# Patient Record
Sex: Female | Born: 1935 | Race: White | Hispanic: No | Smoking: Former smoker
Health system: Southern US, Community
[De-identification: ages and names within clinical notes are randomized; demographics above are authoritative.]

## PROBLEM LIST (undated history)

## (undated) DIAGNOSIS — K5790 Diverticulosis of intestine, part unspecified, without perforation or abscess without bleeding: Secondary | ICD-10-CM

## (undated) DIAGNOSIS — K219 Gastro-esophageal reflux disease without esophagitis: Secondary | ICD-10-CM

## (undated) DIAGNOSIS — M1990 Unspecified osteoarthritis, unspecified site: Secondary | ICD-10-CM

## (undated) DIAGNOSIS — D649 Anemia, unspecified: Secondary | ICD-10-CM

## (undated) DIAGNOSIS — G8929 Other chronic pain: Secondary | ICD-10-CM

## (undated) DIAGNOSIS — E538 Deficiency of other specified B group vitamins: Secondary | ICD-10-CM

## (undated) DIAGNOSIS — M48061 Spinal stenosis, lumbar region without neurogenic claudication: Secondary | ICD-10-CM

## (undated) DIAGNOSIS — T7840XA Allergy, unspecified, initial encounter: Secondary | ICD-10-CM

## (undated) DIAGNOSIS — IMO0002 Reserved for concepts with insufficient information to code with codable children: Secondary | ICD-10-CM

## (undated) DIAGNOSIS — I10 Essential (primary) hypertension: Secondary | ICD-10-CM

## (undated) DIAGNOSIS — N390 Urinary tract infection, site not specified: Secondary | ICD-10-CM

## (undated) DIAGNOSIS — I4891 Unspecified atrial fibrillation: Secondary | ICD-10-CM

## (undated) DIAGNOSIS — K222 Esophageal obstruction: Secondary | ICD-10-CM

## (undated) DIAGNOSIS — R1013 Epigastric pain: Secondary | ICD-10-CM

## (undated) HISTORY — DX: Deficiency of other specified B group vitamins: E53.8

## (undated) HISTORY — DX: Other chronic pain: G89.29

## (undated) HISTORY — DX: Epigastric pain: R10.13

## (undated) HISTORY — DX: Reserved for concepts with insufficient information to code with codable children: IMO0002

## (undated) HISTORY — DX: Diverticulosis of intestine, part unspecified, without perforation or abscess without bleeding: K57.90

## (undated) HISTORY — DX: Allergy, unspecified, initial encounter: T78.40XA

## (undated) HISTORY — DX: Urinary tract infection, site not specified: N39.0

## (undated) HISTORY — DX: Essential (primary) hypertension: I10

## (undated) HISTORY — PX: APPENDECTOMY: SHX54

## (undated) HISTORY — DX: Unspecified atrial fibrillation: I48.91

## (undated) HISTORY — DX: Gastro-esophageal reflux disease without esophagitis: K21.9

## (undated) HISTORY — DX: Unspecified osteoarthritis, unspecified site: M19.90

## (undated) HISTORY — DX: Esophageal obstruction: K22.2

## (undated) HISTORY — DX: Anemia, unspecified: D64.9

## (undated) HISTORY — PX: TONSILLECTOMY: SUR1361

## (undated) HISTORY — DX: Spinal stenosis, lumbar region without neurogenic claudication: M48.061

---

## 1967-08-02 HISTORY — PX: ABDOMINAL HYSTERECTOMY: SHX81

## 1997-08-01 HISTORY — PX: CHOLECYSTECTOMY: SHX55

## 2000-05-15 ENCOUNTER — Other Ambulatory Visit: Admission: RE | Admit: 2000-05-15 | Discharge: 2000-06-02 | Payer: Self-pay | Admitting: Family Medicine

## 2000-05-22 ENCOUNTER — Encounter: Payer: Self-pay | Admitting: Family Medicine

## 2000-05-22 ENCOUNTER — Encounter: Admission: RE | Admit: 2000-05-22 | Discharge: 2000-05-22 | Payer: Self-pay | Admitting: Family Medicine

## 2000-08-29 ENCOUNTER — Encounter: Payer: Self-pay | Admitting: Emergency Medicine

## 2000-08-29 ENCOUNTER — Emergency Department (HOSPITAL_COMMUNITY): Admission: EM | Admit: 2000-08-29 | Discharge: 2000-08-30 | Payer: Self-pay | Admitting: Emergency Medicine

## 2001-08-03 ENCOUNTER — Emergency Department (HOSPITAL_COMMUNITY): Admission: EM | Admit: 2001-08-03 | Discharge: 2001-08-03 | Payer: Self-pay | Admitting: Emergency Medicine

## 2001-08-03 ENCOUNTER — Encounter: Payer: Self-pay | Admitting: Emergency Medicine

## 2003-08-02 HISTORY — PX: COLONOSCOPY: SHX174

## 2003-10-08 ENCOUNTER — Emergency Department (HOSPITAL_COMMUNITY): Admission: EM | Admit: 2003-10-08 | Discharge: 2003-10-08 | Payer: Self-pay | Admitting: Emergency Medicine

## 2003-11-06 ENCOUNTER — Ambulatory Visit (HOSPITAL_COMMUNITY): Admission: RE | Admit: 2003-11-06 | Discharge: 2003-11-06 | Payer: Self-pay | Admitting: Internal Medicine

## 2004-05-16 ENCOUNTER — Emergency Department (HOSPITAL_COMMUNITY): Admission: EM | Admit: 2004-05-16 | Discharge: 2004-05-17 | Payer: Self-pay

## 2004-05-28 DIAGNOSIS — K5730 Diverticulosis of large intestine without perforation or abscess without bleeding: Secondary | ICD-10-CM | POA: Insufficient documentation

## 2004-07-06 ENCOUNTER — Ambulatory Visit: Payer: Self-pay | Admitting: Gastroenterology

## 2004-07-16 ENCOUNTER — Ambulatory Visit: Payer: Self-pay | Admitting: Gastroenterology

## 2004-08-24 ENCOUNTER — Ambulatory Visit: Payer: Self-pay | Admitting: Gastroenterology

## 2004-10-05 ENCOUNTER — Ambulatory Visit: Payer: Self-pay | Admitting: Gastroenterology

## 2004-10-12 ENCOUNTER — Ambulatory Visit: Payer: Self-pay | Admitting: Gastroenterology

## 2004-10-19 ENCOUNTER — Ambulatory Visit: Payer: Self-pay | Admitting: Gastroenterology

## 2004-12-15 ENCOUNTER — Ambulatory Visit: Payer: Self-pay | Admitting: Gastroenterology

## 2004-12-15 ENCOUNTER — Encounter (INDEPENDENT_AMBULATORY_CARE_PROVIDER_SITE_OTHER): Payer: Self-pay | Admitting: Specialist

## 2005-01-20 ENCOUNTER — Ambulatory Visit (HOSPITAL_COMMUNITY): Admission: RE | Admit: 2005-01-20 | Discharge: 2005-01-20 | Payer: Self-pay | Admitting: Gastroenterology

## 2005-01-20 ENCOUNTER — Ambulatory Visit: Payer: Self-pay | Admitting: Gastroenterology

## 2005-03-29 ENCOUNTER — Ambulatory Visit: Payer: Self-pay | Admitting: Gastroenterology

## 2006-02-14 ENCOUNTER — Ambulatory Visit: Payer: Self-pay | Admitting: Gastroenterology

## 2006-03-27 ENCOUNTER — Ambulatory Visit: Payer: Self-pay | Admitting: Gastroenterology

## 2006-04-05 ENCOUNTER — Ambulatory Visit: Payer: Self-pay | Admitting: Gastroenterology

## 2006-04-05 ENCOUNTER — Encounter (INDEPENDENT_AMBULATORY_CARE_PROVIDER_SITE_OTHER): Payer: Self-pay | Admitting: Specialist

## 2006-11-06 ENCOUNTER — Encounter (INDEPENDENT_AMBULATORY_CARE_PROVIDER_SITE_OTHER): Payer: Self-pay | Admitting: Family Medicine

## 2006-11-09 ENCOUNTER — Ambulatory Visit: Payer: Self-pay | Admitting: Gastroenterology

## 2007-02-05 ENCOUNTER — Ambulatory Visit: Payer: Self-pay | Admitting: Family Medicine

## 2007-02-05 DIAGNOSIS — K219 Gastro-esophageal reflux disease without esophagitis: Secondary | ICD-10-CM

## 2007-02-05 DIAGNOSIS — J309 Allergic rhinitis, unspecified: Secondary | ICD-10-CM | POA: Insufficient documentation

## 2007-02-05 DIAGNOSIS — N318 Other neuromuscular dysfunction of bladder: Secondary | ICD-10-CM | POA: Insufficient documentation

## 2007-02-12 ENCOUNTER — Ambulatory Visit: Payer: Self-pay | Admitting: Family Medicine

## 2007-03-01 ENCOUNTER — Ambulatory Visit: Payer: Self-pay | Admitting: Gastroenterology

## 2007-03-01 LAB — CONVERTED CEMR LAB
AST: 24 units/L (ref 0–37)
Albumin: 3.7 g/dL (ref 3.5–5.2)
Basophils Absolute: 0 10*3/uL (ref 0.0–0.1)
Basophils Relative: 1 % (ref 0.0–1.0)
Bilirubin, Direct: 0.1 mg/dL (ref 0.0–0.3)
Eosinophils Absolute: 0.1 10*3/uL (ref 0.0–0.6)
Folate: >20 ng/mL
GFR calc Af Amer: 78 mL/min
GFR calc non Af Amer: 65 mL/min
Glucose, Bld: 104 mg/dL — ABNORMAL HIGH (ref 70–99)
Hemoglobin: 9.9 g/dL — ABNORMAL LOW (ref 12.0–15.0)
Lymphocytes Relative: 37.1 % (ref 12.0–46.0)
MCHC: 34 g/dL (ref 30.0–36.0)
Monocytes Absolute: 0.4 10*3/uL (ref 0.2–0.7)
Monocytes Relative: 9.5 % (ref 3.0–11.0)
Neutro Abs: 2 10*3/uL (ref 1.4–7.7)
Platelets: 197 10*3/uL (ref 150–400)
Potassium: 4.7 meq/L (ref 3.5–5.1)
Sodium: 140 meq/L (ref 135–145)
TSH: 1.26 microintl units/mL (ref 0.35–5.50)
Vitamin B-12: 669 pg/mL (ref 211–911)

## 2007-03-05 ENCOUNTER — Ambulatory Visit: Payer: Self-pay | Admitting: Family Medicine

## 2007-03-05 LAB — CONVERTED CEMR LAB

## 2007-03-12 ENCOUNTER — Ambulatory Visit: Payer: Self-pay | Admitting: Family Medicine

## 2007-03-19 ENCOUNTER — Ambulatory Visit: Payer: Self-pay | Admitting: Family Medicine

## 2007-04-03 LAB — HM DEXA SCAN

## 2007-04-09 ENCOUNTER — Ambulatory Visit: Payer: Self-pay | Admitting: Family Medicine

## 2007-04-10 ENCOUNTER — Telehealth (INDEPENDENT_AMBULATORY_CARE_PROVIDER_SITE_OTHER): Payer: Self-pay | Admitting: *Deleted

## 2007-04-10 ENCOUNTER — Encounter (INDEPENDENT_AMBULATORY_CARE_PROVIDER_SITE_OTHER): Payer: Self-pay | Admitting: Family Medicine

## 2007-04-12 ENCOUNTER — Ambulatory Visit (HOSPITAL_COMMUNITY): Admission: RE | Admit: 2007-04-12 | Discharge: 2007-04-12 | Payer: Self-pay | Admitting: Family Medicine

## 2007-04-12 ENCOUNTER — Encounter (INDEPENDENT_AMBULATORY_CARE_PROVIDER_SITE_OTHER): Payer: Self-pay | Admitting: Family Medicine

## 2007-04-18 ENCOUNTER — Encounter: Payer: Self-pay | Admitting: Gastroenterology

## 2007-04-18 ENCOUNTER — Ambulatory Visit: Payer: Self-pay | Admitting: Gastroenterology

## 2007-04-18 DIAGNOSIS — K449 Diaphragmatic hernia without obstruction or gangrene: Secondary | ICD-10-CM | POA: Insufficient documentation

## 2007-04-18 DIAGNOSIS — K222 Esophageal obstruction: Secondary | ICD-10-CM | POA: Insufficient documentation

## 2007-04-18 LAB — CONVERTED CEMR LAB
Ferritin: 760.5 ng/mL — ABNORMAL HIGH (ref 10.0–291.0)
Folate: >20 ng/mL
Iron: 75 ug/dL (ref 42–145)
Vitamin B-12: 759 pg/mL (ref 211–911)

## 2007-04-30 ENCOUNTER — Ambulatory Visit: Payer: Self-pay | Admitting: Family Medicine

## 2007-04-30 DIAGNOSIS — M810 Age-related osteoporosis without current pathological fracture: Secondary | ICD-10-CM

## 2007-04-30 DIAGNOSIS — R319 Hematuria, unspecified: Secondary | ICD-10-CM | POA: Insufficient documentation

## 2007-04-30 LAB — CONVERTED CEMR LAB
Bilirubin Urine: NEGATIVE
Ketones, urine, test strip: NEGATIVE
Protein, U semiquant: NEGATIVE

## 2007-05-01 ENCOUNTER — Encounter (INDEPENDENT_AMBULATORY_CARE_PROVIDER_SITE_OTHER): Payer: Self-pay | Admitting: Family Medicine

## 2007-05-01 ENCOUNTER — Telehealth (INDEPENDENT_AMBULATORY_CARE_PROVIDER_SITE_OTHER): Payer: Self-pay | Admitting: *Deleted

## 2007-05-01 LAB — CONVERTED CEMR LAB
ALT: 16 units/L (ref 0–35)
AST: 20 units/L (ref 0–37)
Albumin: 3.8 g/dL (ref 3.5–5.2)
BUN: 26 mg/dL — ABNORMAL HIGH (ref 6–23)
CO2: 22 meq/L (ref 19–32)
Calcium: 8.3 mg/dL — ABNORMAL LOW (ref 8.4–10.5)
Chloride: 109 meq/L (ref 96–112)
Phosphorus: 3.4 mg/dL (ref 2.3–4.6)
Potassium: 4.2 meq/L (ref 3.5–5.3)

## 2007-05-02 ENCOUNTER — Encounter (INDEPENDENT_AMBULATORY_CARE_PROVIDER_SITE_OTHER): Payer: Self-pay | Admitting: Family Medicine

## 2007-05-02 ENCOUNTER — Telehealth (INDEPENDENT_AMBULATORY_CARE_PROVIDER_SITE_OTHER): Payer: Self-pay | Admitting: Family Medicine

## 2007-05-03 ENCOUNTER — Encounter (INDEPENDENT_AMBULATORY_CARE_PROVIDER_SITE_OTHER): Payer: Self-pay | Admitting: Family Medicine

## 2007-05-18 ENCOUNTER — Ambulatory Visit: Payer: Self-pay | Admitting: Gastroenterology

## 2007-05-22 ENCOUNTER — Ambulatory Visit: Payer: Self-pay | Admitting: Family Medicine

## 2007-06-01 ENCOUNTER — Ambulatory Visit: Payer: Self-pay | Admitting: Hematology and Oncology

## 2007-06-07 ENCOUNTER — Encounter (INDEPENDENT_AMBULATORY_CARE_PROVIDER_SITE_OTHER): Payer: Self-pay | Admitting: Family Medicine

## 2007-06-07 ENCOUNTER — Telehealth (INDEPENDENT_AMBULATORY_CARE_PROVIDER_SITE_OTHER): Payer: Self-pay | Admitting: *Deleted

## 2007-06-07 LAB — CBC & DIFF AND RETIC
BASO%: 0.7 % (ref 0.0–2.0)
EOS%: 0 % (ref 0.0–7.0)
IRF: 0.28 (ref 0.130–0.330)
MCH: 30.9 pg (ref 26.0–34.0)
MCHC: 35.5 g/dL (ref 32.0–36.0)
MONO%: 9.3 % (ref 0.0–13.0)
RDW: 13.3 % (ref 11.3–14.5)
RETIC #: 36.4 10*3/uL (ref 19.7–115.1)
lymph#: 1.2 10*3/uL (ref 0.9–3.3)

## 2007-06-07 LAB — CHCC SMEAR

## 2007-06-11 ENCOUNTER — Ambulatory Visit: Payer: Self-pay | Admitting: Family Medicine

## 2007-06-11 LAB — PROTEIN ELECTROPHORESIS, SERUM
Alpha-2-Globulin: 9.6 % (ref 7.1–11.8)
Gamma Globulin: 25.8 % — ABNORMAL HIGH (ref 11.1–18.8)
Total Protein, Serum Electrophoresis: 7.6 g/dL (ref 6.0–8.3)

## 2007-06-11 LAB — COMPREHENSIVE METABOLIC PANEL
ALT: 14 U/L (ref 0–35)
BUN: 33 mg/dL — ABNORMAL HIGH (ref 6–23)
CO2: 21 mEq/L (ref 19–32)
Creatinine, Ser: 1.01 mg/dL (ref 0.40–1.20)
Total Bilirubin: 0.7 mg/dL (ref 0.3–1.2)

## 2007-06-11 LAB — ERYTHROPOIETIN: Erythropoietin: 10.4 m[IU]/mL (ref 2.6–34.0)

## 2007-06-11 LAB — VITAMIN B12: Vitamin B-12: 492 pg/mL (ref 211–911)

## 2007-06-11 LAB — IRON AND TIBC
%SAT: 33 % (ref 20–55)
Iron: 82 ug/dL (ref 42–145)

## 2007-06-11 LAB — DIRECT ANTIGLOBULIN TEST (NOT AT ARMC)
DAT (Complement): NEGATIVE
DAT IgG: NEGATIVE

## 2007-06-11 LAB — HAPTOGLOBIN: Haptoglobin: 128 mg/dL (ref 16–200)

## 2007-07-09 ENCOUNTER — Ambulatory Visit: Payer: Self-pay | Admitting: Family Medicine

## 2007-10-05 DIAGNOSIS — D539 Nutritional anemia, unspecified: Secondary | ICD-10-CM

## 2008-04-01 ENCOUNTER — Emergency Department (HOSPITAL_COMMUNITY): Admission: EM | Admit: 2008-04-01 | Discharge: 2008-04-02 | Payer: Self-pay | Admitting: Family Medicine

## 2008-08-22 ENCOUNTER — Encounter: Payer: Self-pay | Admitting: Family Medicine

## 2008-08-22 ENCOUNTER — Encounter: Admission: RE | Admit: 2008-08-22 | Discharge: 2008-08-22 | Payer: Self-pay | Admitting: Family Medicine

## 2008-09-16 ENCOUNTER — Encounter: Payer: Self-pay | Admitting: Family Medicine

## 2008-10-21 ENCOUNTER — Ambulatory Visit: Payer: Self-pay | Admitting: Family Medicine

## 2008-10-21 DIAGNOSIS — I10 Essential (primary) hypertension: Secondary | ICD-10-CM | POA: Insufficient documentation

## 2008-10-21 LAB — CONVERTED CEMR LAB
Glucose, Urine, Semiquant: NEGATIVE
Ketones, urine, test strip: NEGATIVE
Nitrite: NEGATIVE
Protein, U semiquant: NEGATIVE

## 2008-10-22 ENCOUNTER — Encounter: Payer: Self-pay | Admitting: Family Medicine

## 2008-10-22 LAB — CONVERTED CEMR LAB
Basophils Relative: 0.5 % (ref 0.0–3.0)
Eosinophils Relative: 3.2 % (ref 0.0–5.0)
HCT: 27.6 % — ABNORMAL LOW (ref 36.0–46.0)
Hemoglobin: 9.2 g/dL — ABNORMAL LOW (ref 12.0–15.0)
Lymphs Abs: 1.4 10*3/uL (ref 0.7–4.0)
MCV: 90.1 fL (ref 78.0–100.0)
Monocytes Absolute: 0.3 10*3/uL (ref 0.1–1.0)
Neutro Abs: 2.2 10*3/uL (ref 1.4–7.7)
RBC: 3.06 M/uL — ABNORMAL LOW (ref 3.87–5.11)
WBC: 4 10*3/uL — ABNORMAL LOW (ref 4.5–10.5)

## 2008-11-17 ENCOUNTER — Ambulatory Visit: Payer: Self-pay | Admitting: Family Medicine

## 2008-12-10 ENCOUNTER — Ambulatory Visit: Payer: Self-pay | Admitting: Family Medicine

## 2008-12-15 ENCOUNTER — Telehealth: Payer: Self-pay | Admitting: Speech Pathology

## 2008-12-19 ENCOUNTER — Encounter: Payer: Self-pay | Admitting: Family Medicine

## 2008-12-23 ENCOUNTER — Encounter: Payer: Self-pay | Admitting: Family Medicine

## 2009-01-07 ENCOUNTER — Ambulatory Visit: Payer: Self-pay | Admitting: Family Medicine

## 2009-01-16 ENCOUNTER — Ambulatory Visit: Payer: Self-pay | Admitting: Family Medicine

## 2009-01-16 LAB — CONVERTED CEMR LAB
Ketones, urine, test strip: NEGATIVE
pH: 6.5

## 2009-02-04 ENCOUNTER — Ambulatory Visit: Payer: Self-pay | Admitting: Family Medicine

## 2009-02-25 ENCOUNTER — Ambulatory Visit: Payer: Self-pay | Admitting: Family Medicine

## 2009-02-25 LAB — CONVERTED CEMR LAB
Nitrite: NEGATIVE
Specific Gravity, Urine: 1.02
pH: 5.5

## 2009-02-26 ENCOUNTER — Encounter: Payer: Self-pay | Admitting: Family Medicine

## 2009-03-12 ENCOUNTER — Ambulatory Visit: Payer: Self-pay | Admitting: Family Medicine

## 2009-03-19 ENCOUNTER — Ambulatory Visit: Payer: Self-pay | Admitting: Family Medicine

## 2009-03-19 LAB — CONVERTED CEMR LAB
Glucose, Urine, Semiquant: NEGATIVE
Ketones, urine, test strip: NEGATIVE
Nitrite: NEGATIVE
Urobilinogen, UA: 0.2
pH: 6

## 2009-03-23 LAB — CONVERTED CEMR LAB
Basophils Absolute: 0 10*3/uL (ref 0.0–0.1)
CO2: 26 meq/L (ref 19–32)
Calcium: 8.8 mg/dL (ref 8.4–10.5)
Creatinine, Ser: 0.8 mg/dL (ref 0.4–1.2)
Eosinophils Absolute: 0.1 10*3/uL (ref 0.0–0.7)
GFR calc non Af Amer: 73.9 mL/min (ref >60)
Lymphs Abs: 1.5 10*3/uL (ref 0.7–4.0)
Monocytes Absolute: 0.2 10*3/uL (ref 0.1–1.0)
Neutro Abs: 2.1 10*3/uL (ref 1.4–7.7)
Platelets: 162 10*3/uL (ref 150.0–400.0)
RBC: 3.16 M/uL — ABNORMAL LOW (ref 3.87–5.11)
RDW: 12.4 % (ref 11.5–14.6)
Sodium: 141 meq/L (ref 135–145)
WBC: 3.9 10*3/uL — ABNORMAL LOW (ref 4.5–10.5)

## 2009-04-02 ENCOUNTER — Ambulatory Visit: Payer: Self-pay | Admitting: Family Medicine

## 2009-04-02 LAB — CONVERTED CEMR LAB
Bilirubin Urine: NEGATIVE
Glucose, Urine, Semiquant: NEGATIVE
Ketones, urine, test strip: NEGATIVE
Specific Gravity, Urine: 1.02
Urobilinogen, UA: 0.2

## 2009-04-03 ENCOUNTER — Encounter: Payer: Self-pay | Admitting: Family Medicine

## 2009-04-16 ENCOUNTER — Ambulatory Visit: Payer: Self-pay | Admitting: Family Medicine

## 2009-05-14 ENCOUNTER — Ambulatory Visit: Payer: Self-pay | Admitting: Family Medicine

## 2009-05-25 ENCOUNTER — Telehealth: Payer: Self-pay | Admitting: Gastroenterology

## 2009-05-25 ENCOUNTER — Ambulatory Visit: Payer: Self-pay | Admitting: Internal Medicine

## 2009-05-25 DIAGNOSIS — K210 Gastro-esophageal reflux disease with esophagitis: Secondary | ICD-10-CM

## 2009-05-25 DIAGNOSIS — R1319 Other dysphagia: Secondary | ICD-10-CM

## 2009-05-26 ENCOUNTER — Ambulatory Visit (HOSPITAL_COMMUNITY): Admission: RE | Admit: 2009-05-26 | Discharge: 2009-05-26 | Payer: Self-pay | Admitting: Internal Medicine

## 2009-05-27 LAB — CONVERTED CEMR LAB
ALT: 22 units/L (ref 0–35)
Amylase: 148 units/L — ABNORMAL HIGH (ref 27–131)
Lipase: 45 units/L (ref 11.0–59.0)
Total Bilirubin: 0.7 mg/dL (ref 0.3–1.2)

## 2009-05-28 ENCOUNTER — Ambulatory Visit: Payer: Self-pay | Admitting: Family Medicine

## 2009-05-28 LAB — CONVERTED CEMR LAB
Blood in Urine, dipstick: NEGATIVE
Protein, U semiquant: NEGATIVE
Urobilinogen, UA: 0.2
pH: 5

## 2009-05-29 ENCOUNTER — Encounter: Payer: Self-pay | Admitting: Family Medicine

## 2009-06-04 ENCOUNTER — Ambulatory Visit: Payer: Self-pay | Admitting: Internal Medicine

## 2009-06-04 HISTORY — PX: ESOPHAGOGASTRODUODENOSCOPY: SHX1529

## 2009-06-08 ENCOUNTER — Telehealth (INDEPENDENT_AMBULATORY_CARE_PROVIDER_SITE_OTHER): Payer: Self-pay

## 2009-06-10 ENCOUNTER — Ambulatory Visit: Payer: Self-pay | Admitting: Family Medicine

## 2009-06-24 ENCOUNTER — Ambulatory Visit: Payer: Self-pay | Admitting: Family Medicine

## 2009-06-24 DIAGNOSIS — N390 Urinary tract infection, site not specified: Secondary | ICD-10-CM | POA: Insufficient documentation

## 2009-06-24 LAB — CONVERTED CEMR LAB
Specific Gravity, Urine: 1.015
Urobilinogen, UA: 0.2

## 2009-06-25 ENCOUNTER — Encounter: Payer: Self-pay | Admitting: Family Medicine

## 2009-07-07 ENCOUNTER — Encounter (INDEPENDENT_AMBULATORY_CARE_PROVIDER_SITE_OTHER): Payer: Self-pay | Admitting: *Deleted

## 2009-07-09 ENCOUNTER — Ambulatory Visit: Payer: Self-pay | Admitting: Family Medicine

## 2009-07-15 ENCOUNTER — Encounter (INDEPENDENT_AMBULATORY_CARE_PROVIDER_SITE_OTHER): Payer: Self-pay | Admitting: *Deleted

## 2009-07-22 ENCOUNTER — Encounter: Payer: Self-pay | Admitting: Family Medicine

## 2009-08-01 DIAGNOSIS — I4891 Unspecified atrial fibrillation: Secondary | ICD-10-CM

## 2009-08-01 HISTORY — DX: Unspecified atrial fibrillation: I48.91

## 2009-08-04 ENCOUNTER — Ambulatory Visit: Payer: Self-pay | Admitting: Family Medicine

## 2009-08-25 ENCOUNTER — Ambulatory Visit: Payer: Self-pay | Admitting: Family Medicine

## 2009-08-31 ENCOUNTER — Encounter: Payer: Self-pay | Admitting: Family Medicine

## 2009-09-03 ENCOUNTER — Ambulatory Visit: Payer: Self-pay | Admitting: Family Medicine

## 2009-09-11 ENCOUNTER — Ambulatory Visit: Payer: Self-pay | Admitting: Family Medicine

## 2009-09-14 ENCOUNTER — Telehealth: Payer: Self-pay | Admitting: Family Medicine

## 2009-10-01 ENCOUNTER — Ambulatory Visit: Payer: Self-pay | Admitting: Family Medicine

## 2009-10-01 ENCOUNTER — Telehealth: Payer: Self-pay | Admitting: Family Medicine

## 2009-10-08 ENCOUNTER — Ambulatory Visit: Payer: Self-pay | Admitting: Family Medicine

## 2009-10-08 LAB — CONVERTED CEMR LAB
Blood in Urine, dipstick: NEGATIVE
Ketones, urine, test strip: NEGATIVE
Specific Gravity, Urine: 1.02
pH: 5

## 2009-10-09 ENCOUNTER — Encounter: Payer: Self-pay | Admitting: Family Medicine

## 2009-10-12 ENCOUNTER — Encounter: Payer: Self-pay | Admitting: Family Medicine

## 2009-11-05 ENCOUNTER — Ambulatory Visit: Payer: Self-pay | Admitting: Family Medicine

## 2009-11-12 ENCOUNTER — Ambulatory Visit: Payer: Self-pay | Admitting: Family Medicine

## 2009-11-12 LAB — CONVERTED CEMR LAB
Bilirubin Urine: NEGATIVE
Ketones, urine, test strip: NEGATIVE
Specific Gravity, Urine: 1.01
pH: 6

## 2009-11-13 ENCOUNTER — Encounter: Payer: Self-pay | Admitting: Family Medicine

## 2009-12-03 ENCOUNTER — Ambulatory Visit: Payer: Self-pay | Admitting: Family Medicine

## 2009-12-04 LAB — CONVERTED CEMR LAB
CO2: 28 meq/L (ref 19–32)
Chloride: 107 meq/L (ref 96–112)
Eosinophils Relative: 1.9 % (ref 0.0–5.0)
HCT: 29.2 % — ABNORMAL LOW (ref 36.0–46.0)
HDL: 47.9 mg/dL (ref >39.00)
LDL Cholesterol: 107 mg/dL — ABNORMAL HIGH (ref 0–99)
Lymphocytes Relative: 39.5 % (ref 12.0–46.0)
Monocytes Relative: 8.5 % (ref 3.0–12.0)
Neutrophils Relative %: 49.6 % (ref 43.0–77.0)
Platelets: 182 10*3/uL (ref 150.0–400.0)
Potassium: 4.3 meq/L (ref 3.5–5.1)
Sodium: 141 meq/L (ref 135–145)
Total CHOL/HDL Ratio: 4
Triglycerides: 113 mg/dL (ref 0.0–149.0)
Vitamin B-12: >1500 pg/mL — ABNORMAL HIGH (ref 211–911)
WBC: 3.5 10*3/uL — ABNORMAL LOW (ref 4.5–10.5)

## 2010-01-07 ENCOUNTER — Ambulatory Visit: Payer: Self-pay | Admitting: Family Medicine

## 2010-02-04 ENCOUNTER — Ambulatory Visit: Payer: Self-pay | Admitting: Family Medicine

## 2010-02-04 LAB — CONVERTED CEMR LAB
Nitrite: NEGATIVE
Protein, U semiquant: NEGATIVE
Urobilinogen, UA: 0.2

## 2010-02-05 ENCOUNTER — Encounter: Payer: Self-pay | Admitting: Family Medicine

## 2010-02-10 ENCOUNTER — Ambulatory Visit: Payer: Self-pay | Admitting: Family Medicine

## 2010-03-04 ENCOUNTER — Ambulatory Visit: Payer: Self-pay | Admitting: Family Medicine

## 2010-03-05 ENCOUNTER — Emergency Department (HOSPITAL_COMMUNITY): Admission: EM | Admit: 2010-03-05 | Discharge: 2010-03-05 | Payer: Self-pay | Admitting: Emergency Medicine

## 2010-03-10 ENCOUNTER — Ambulatory Visit: Payer: Self-pay | Admitting: Family Medicine

## 2010-03-10 DIAGNOSIS — R609 Edema, unspecified: Secondary | ICD-10-CM | POA: Insufficient documentation

## 2010-03-10 LAB — CONVERTED CEMR LAB
Bilirubin Urine: NEGATIVE
Blood in Urine, dipstick: NEGATIVE
Protein, U semiquant: NEGATIVE
Urobilinogen, UA: 0.2
WBC Urine, dipstick: NEGATIVE

## 2010-03-11 LAB — CONVERTED CEMR LAB
CO2: 26 meq/L (ref 19–32)
Calcium: 8.9 mg/dL (ref 8.4–10.5)
Chloride: 107 meq/L (ref 96–112)
Glucose, Bld: 82 mg/dL (ref 70–99)
Pro B Natriuretic peptide (BNP): 86.4 pg/mL (ref 0.0–100.0)
Sodium: 140 meq/L (ref 135–145)

## 2010-03-13 ENCOUNTER — Emergency Department (HOSPITAL_COMMUNITY): Admission: EM | Admit: 2010-03-13 | Discharge: 2010-03-13 | Payer: Self-pay | Admitting: Emergency Medicine

## 2010-03-16 ENCOUNTER — Emergency Department (HOSPITAL_COMMUNITY): Admission: EM | Admit: 2010-03-16 | Discharge: 2010-03-16 | Payer: Self-pay | Admitting: Emergency Medicine

## 2010-03-17 ENCOUNTER — Observation Stay (HOSPITAL_COMMUNITY): Admission: EM | Admit: 2010-03-17 | Discharge: 2010-03-20 | Payer: Self-pay | Admitting: Emergency Medicine

## 2010-03-18 ENCOUNTER — Ambulatory Visit: Payer: Self-pay | Admitting: Internal Medicine

## 2010-03-19 ENCOUNTER — Encounter (INDEPENDENT_AMBULATORY_CARE_PROVIDER_SITE_OTHER): Payer: Self-pay | Admitting: Family Medicine

## 2010-03-19 HISTORY — PX: ESOPHAGOGASTRODUODENOSCOPY: SHX1529

## 2010-03-22 ENCOUNTER — Encounter (INDEPENDENT_AMBULATORY_CARE_PROVIDER_SITE_OTHER): Payer: Self-pay

## 2010-03-23 ENCOUNTER — Encounter (INDEPENDENT_AMBULATORY_CARE_PROVIDER_SITE_OTHER): Payer: Self-pay | Admitting: *Deleted

## 2010-03-23 ENCOUNTER — Encounter: Payer: Self-pay | Admitting: Internal Medicine

## 2010-03-24 ENCOUNTER — Telehealth: Payer: Self-pay | Admitting: Family Medicine

## 2010-03-24 ENCOUNTER — Emergency Department (HOSPITAL_COMMUNITY): Admission: EM | Admit: 2010-03-24 | Discharge: 2010-03-24 | Payer: Self-pay | Admitting: Emergency Medicine

## 2010-03-25 ENCOUNTER — Telehealth (INDEPENDENT_AMBULATORY_CARE_PROVIDER_SITE_OTHER): Payer: Self-pay | Admitting: *Deleted

## 2010-03-26 ENCOUNTER — Encounter: Payer: Self-pay | Admitting: Internal Medicine

## 2010-03-31 ENCOUNTER — Emergency Department (HOSPITAL_COMMUNITY): Admission: EM | Admit: 2010-03-31 | Discharge: 2010-03-31 | Payer: Self-pay | Admitting: Emergency Medicine

## 2010-04-01 ENCOUNTER — Ambulatory Visit: Payer: Self-pay | Admitting: Family Medicine

## 2010-04-01 DIAGNOSIS — E538 Deficiency of other specified B group vitamins: Secondary | ICD-10-CM

## 2010-04-06 LAB — CONVERTED CEMR LAB: Tissue Transglutaminase Ab, IgA: 4.8 units (ref <20)

## 2010-04-07 ENCOUNTER — Ambulatory Visit: Payer: Self-pay | Admitting: Internal Medicine

## 2010-04-07 DIAGNOSIS — R1013 Epigastric pain: Secondary | ICD-10-CM | POA: Insufficient documentation

## 2010-04-12 ENCOUNTER — Ambulatory Visit: Payer: Self-pay | Admitting: Internal Medicine

## 2010-04-13 ENCOUNTER — Ambulatory Visit (HOSPITAL_COMMUNITY): Admission: RE | Admit: 2010-04-13 | Discharge: 2010-04-13 | Payer: Self-pay | Admitting: Internal Medicine

## 2010-04-14 ENCOUNTER — Ambulatory Visit: Payer: Self-pay | Admitting: Family Medicine

## 2010-04-14 DIAGNOSIS — I7300 Raynaud's syndrome without gangrene: Secondary | ICD-10-CM

## 2010-04-15 LAB — CONVERTED CEMR LAB
Basophils Absolute: 0 10*3/uL (ref 0.0–0.1)
Basophils Relative: 0.5 % (ref 0.0–3.0)
Eosinophils Relative: 0 % (ref 0.0–5.0)
HCT: 27.9 % — ABNORMAL LOW (ref 36.0–46.0)
Hemoglobin: 9.4 g/dL — ABNORMAL LOW (ref 12.0–15.0)
Lymphocytes Relative: 25.4 % (ref 12.0–46.0)
Lymphs Abs: 1.2 10*3/uL (ref 0.7–4.0)
Monocytes Relative: 10.5 % (ref 3.0–12.0)
Neutro Abs: 2.9 10*3/uL (ref 1.4–7.7)
RBC: 3.08 M/uL — ABNORMAL LOW (ref 3.87–5.11)
RDW: 13.5 % (ref 11.5–14.6)

## 2010-05-12 ENCOUNTER — Ambulatory Visit: Payer: Self-pay | Admitting: Family Medicine

## 2010-05-12 DIAGNOSIS — K5900 Constipation, unspecified: Secondary | ICD-10-CM | POA: Insufficient documentation

## 2010-05-20 ENCOUNTER — Encounter: Payer: Self-pay | Admitting: Internal Medicine

## 2010-05-23 ENCOUNTER — Emergency Department (HOSPITAL_COMMUNITY): Admission: EM | Admit: 2010-05-23 | Discharge: 2010-05-23 | Payer: Self-pay | Admitting: Emergency Medicine

## 2010-05-26 ENCOUNTER — Ambulatory Visit: Payer: Self-pay | Admitting: Family Medicine

## 2010-05-26 ENCOUNTER — Ambulatory Visit: Payer: Self-pay | Admitting: Cardiology

## 2010-05-26 ENCOUNTER — Telehealth: Payer: Self-pay | Admitting: Family Medicine

## 2010-05-26 DIAGNOSIS — R509 Fever, unspecified: Secondary | ICD-10-CM

## 2010-05-27 ENCOUNTER — Ambulatory Visit: Payer: Self-pay | Admitting: Cardiology

## 2010-05-27 ENCOUNTER — Inpatient Hospital Stay (HOSPITAL_COMMUNITY): Admission: EM | Admit: 2010-05-27 | Discharge: 2010-06-01 | Payer: Self-pay | Admitting: Emergency Medicine

## 2010-05-28 ENCOUNTER — Encounter (INDEPENDENT_AMBULATORY_CARE_PROVIDER_SITE_OTHER): Payer: Self-pay | Admitting: Internal Medicine

## 2010-06-01 ENCOUNTER — Encounter (INDEPENDENT_AMBULATORY_CARE_PROVIDER_SITE_OTHER): Payer: Self-pay | Admitting: *Deleted

## 2010-06-01 LAB — CONVERTED CEMR LAB
Basophils Relative: 1 %
Eosinophils Absolute: 0.4 10*3/uL
Eosinophils Relative: 8 %
HCT: 26.6 %
Hemoglobin: 8.9 g/dL
Lymphs Abs: 1.2 10*3/uL
MCHC: 29.8 g/dL
MCV: 88.7 fL
Platelets: 234 10*3/uL
RDW: 14 %

## 2010-06-02 ENCOUNTER — Encounter (INDEPENDENT_AMBULATORY_CARE_PROVIDER_SITE_OTHER): Payer: Self-pay | Admitting: *Deleted

## 2010-06-11 ENCOUNTER — Ambulatory Visit: Payer: Self-pay | Admitting: Family Medicine

## 2010-06-11 DIAGNOSIS — J159 Unspecified bacterial pneumonia: Secondary | ICD-10-CM | POA: Insufficient documentation

## 2010-06-11 DIAGNOSIS — I4891 Unspecified atrial fibrillation: Secondary | ICD-10-CM

## 2010-06-11 DIAGNOSIS — S2239XA Fracture of one rib, unspecified side, initial encounter for closed fracture: Secondary | ICD-10-CM | POA: Insufficient documentation

## 2010-06-11 LAB — CONVERTED CEMR LAB
Basophils Relative: 1 % (ref 0.0–3.0)
Eosinophils Absolute: 0.5 10*3/uL (ref 0.0–0.7)
Eosinophils Relative: 9.5 % — ABNORMAL HIGH (ref 0.0–5.0)
Lymphocytes Relative: 21.2 % (ref 12.0–46.0)
MCV: 88.6 fL (ref 78.0–100.0)
Monocytes Absolute: 0.6 10*3/uL (ref 0.1–1.0)
Neutrophils Relative %: 57.1 % (ref 43.0–77.0)
Platelets: 260 10*3/uL (ref 150.0–400.0)
RBC: 3.18 M/uL — ABNORMAL LOW (ref 3.87–5.11)
WBC: 5.1 10*3/uL (ref 4.5–10.5)

## 2010-06-13 ENCOUNTER — Emergency Department (HOSPITAL_COMMUNITY): Admission: EM | Admit: 2010-06-13 | Discharge: 2010-06-13 | Payer: Self-pay | Admitting: Emergency Medicine

## 2010-06-14 ENCOUNTER — Telehealth: Payer: Self-pay | Admitting: Family Medicine

## 2010-06-15 ENCOUNTER — Encounter: Payer: Self-pay | Admitting: Family Medicine

## 2010-07-06 ENCOUNTER — Telehealth: Payer: Self-pay | Admitting: Family Medicine

## 2010-07-22 ENCOUNTER — Encounter: Payer: Self-pay | Admitting: Gastroenterology

## 2010-08-11 ENCOUNTER — Ambulatory Visit
Admission: RE | Admit: 2010-08-11 | Discharge: 2010-08-11 | Payer: Self-pay | Source: Home / Self Care | Attending: Family Medicine | Admitting: Family Medicine

## 2010-08-11 DIAGNOSIS — B372 Candidiasis of skin and nail: Secondary | ICD-10-CM | POA: Insufficient documentation

## 2010-08-25 ENCOUNTER — Ambulatory Visit
Admission: RE | Admit: 2010-08-25 | Discharge: 2010-08-25 | Payer: Self-pay | Source: Home / Self Care | Attending: Family Medicine | Admitting: Family Medicine

## 2010-08-31 NOTE — Assessment & Plan Note (Signed)
Summary: b12 inj/njr  Nurse Visit   Allergies: 1)  ! Demerol 2)  ! Motrin 3)  ! Darvocet 4)  ! Morphine 5)  ! Darvon 6)  ! Caffeine 7)  ! Bactrim 8)  ! Penicillin V Potassium (Penicillin V Potassium)  Medication Administration  Injection # 1:    Medication: Vit B12 1000 mcg    Diagnosis: FATIGUE (ICD-780.79)    Route: IM    Site: R deltoid    Exp Date: 04/02/2011    Lot #: [id number]    Mfr: American Regent    Patient tolerated injection without complications    Given by: Sid Falcon LPN (November 06, 2839 12:20 PM)  Orders Added: 1)  Vit B12 1000 mcg [J3420] 2)  Admin of Therapeutic Inj  intramuscular or subcutaneous [32440]

## 2010-08-31 NOTE — Letter (Signed)
Summary: Patient Notice, Endo Biopsy Results  [facility]  [address]   [place], [place] [address]   Phone: [phone]  Fax: [phone]       March 26, 2010   LUCILLIA CORSON [address] [place], [place]  [address] February 28, 1930    Dear Ms. Trebilcock,  I am pleased to inform you that the biopsies taken during your recent endoscopic examination did not show any evidence of cancer upon pathologic examination.  Additional information/recommendations:  Continue with the treatment plan as outlined on the day of your exam.  Please call us if you are having persistent problems or have questions about your condition that have not been fully answered at this time.  Sincerely,    R. Roetta Sessions MD, FACP [facility] Ph: [phone]   Fax: [phone]   Appended Document: Patient Notice, Endo Biopsy Results Letter mailed to pt and letter and path mailed to Dr. Caryl Never.

## 2010-08-31 NOTE — Assessment & Plan Note (Signed)
Summary: S/P EGD 2 WKS F/U/LAW   Visit Type:  Follow-up Visit Primary Care Provider:  Evelena Peat  Chief Complaint:  F/U EGD.  History of Present Illness: Patient is here for f/u of recent EGD during hospitalization for atypical chest pain. Patient tells me she never had chest pain, pain is always in epigastrium. She r/o for MI. CTA chest was negative for PE. She had tiny nonspecific upper lobe lung nodules, compression of T5 (age indeterminate).  She had six ED visits in 8/11. Two since her D/C on 03/20/10. She states she has epigastric pain which resolves after receiving GI cocktail. She has had numerous normal LFTs. Lipase normal. WBC normal. Hgb runs in 9 range. Cardiac markers negative multiple times.   EGD 03/19/10, Dr. Rourk-->. Noncritical-appearing Schatzki's ring otherwise unremarkable esophagus status post passage of a 56-French Maloney dilator. Small hiatal hernia. Antral erythema with focal area of adenomatous-appearing mucosa of uncertain significance status post biopsy (bx benign).   Prior w/u in [place] includes several EGD/ED by Dr. Sheryn Bison. EM in 2006, low LES pressure but no evidence of achalasia. Ambulatory pH probe study showed high DeMeester score but unclear if was done on acid suppression.    Doing better on Nexium two times a day. Still with some burning under left breast. Comes and go. Swallowing better now. BM some everday. No melena, brbpr. No heartburn/regurgitation. No n/v. Appetite okay but eating only bland foods. Of note, patient was switched to Aciphex at time of EGD. She took for one week before calling and requesting to take protonix. After one week of protonix, she switched herself back to Nexium two times a day. She failed Prevacid and Prilosec in the past.   Current Medications (verified): 1)  Cyanocobalamin 1000 Mcg/ml Soln (Cyanocobalamin) .... Inject One Ml Im Monthly 2)  Monoject Syringe 25g X 1" 3 Ml Misc (Syringe/needle (Disp)) .... Use Monthly  As Directed 3)  Sertraline Hcl 50 Mg Tabs (Sertraline Hcl) .... One Half Tablet Daily 4)  Nexium 40 Mg Cpdr (Esomeprazole Magnesium) .... Take 1 Tablet By Mouth Two Times A Day 5)  Mylanta .... 2 Tablespoons Every 4 Hours 6)  Pepcid .... One Tablet Twice Daily With The Nexium  Allergies (verified): 1)  ! Demerol 2)  ! Motrin 3)  ! Darvocet 4)  ! Morphine 5)  ! Darvon 6)  ! Caffeine 7)  ! Bactrim 8)  ! Penicillin V Potassium (Penicillin V Potassium)  Past History:  Past Medical History: GERD / Esophageal strictures Allergic rhinitis Arthritis Diverticulosis Hypertension UTI, recurrent osteoporosis Lumbar stenosis Chronic normocytic anemia EGD/ED, 03/19/10--> Noncritical-appearing Schatzki's ring otherwise unremarkable esophagus status post passage of a 56-French Maloney dilator. Small hiatal hernia. Antral erythema with focal areaof adenomatous-appearing mucosa of uncertain significance status post biopsy. Path negative. B12 def     Family History: Father: deceased 81 Leukemia Mother: deceased 68 Staph Infection after hip replacement; HTN 4 brothers: DM, COPD, reflux, Multiple Myeloma 1 child: healthy Family History of Prostate Cancer:Brothers x2 No FH of CRC.  Review of Systems      See HPI  Vital Signs:  Patient profile:   77 year old female Menstrual status:  postmenopausal Height:      62 inches Weight:      137 pounds BMI:     25.15 Temp:     98.1 degrees F oral Pulse rate:   88 / minute BP sitting:   110 / 70  (left arm) Cuff size:   regular  Vitals Entered By: Cloria Spring LPN (April 07, 2010 1:34 PM)  Physical Exam  General:  Well developed, well nourished, no acute distress. Head:  Normocephalic and atraumatic. Eyes:  sclera nonicteric Mouth:  op moist Lungs:  Clear throughout to auscultation. Heart:  Regular rate and rhythm; no murmurs, rubs,  or bruits. Abdomen:  Mild epig tenderness. No rebound or guarding. No HSM or masses. No CVA  tendernss. No evidence of rash. No abd bruit or hernia.   No pinpoint tenderness over thoracic spine. Extremities:  No clubbing, cyanosis, edema or deformities noted. Neurologic:  Alert and  oriented x4;  grossly normal neurologically. Skin:  Intact without significant lesions or rashes. Psych:  Alert and cooperative. Normal mood and affect.  Impression & Recommendations:  Problem # 1:  EPIGASTRIC PAIN, CHRONIC (ICD-789.06)  75% better per patient but last ED visit only one week ago. Gastritis on EGD. She switches PPIs frequently without giving new ones chance to work. At any rate, feels better on Nexium two times a day. She also takes Pepcid for breakthrough symptoms. Still with some mild burning in LUQ. Not reproduced on exam. ?referred pain from back. Discussed with patient, she will call in two weeks or sooner if needed. If pain persisting in abd, then CT A/P with IV/oral contrast.   Orders: Est. Patient Level II (21308)  Problem # 2:  ANEMIA, CHRONIC (ICD-281.9) Stable. H/O B12 inj monthly. Previously Folate and iron levels okay in 2008. Hgb stable since 2008. Last TCS per EMR, approximately 2005/2006 and poor prep ([facility]). Discussed with patient, she needs to decide if she prefers GI care here or follow-up with Dr. Jarold Motto, at this point she is not sure. Will determine timing of next TCS in near future. Retrieve official records.   Problem # 3:  DYSPHAGIA (MVH-846.96) Resolved s/p EGD/ED. Orders: Est. Patient Level II (29528)  Patient Instructions: 1)  Please call our office in two weeks and let us know how you are feeling, ie if abdominal pain resolved. If persistent, then would consider CT abd/pelvis.  2)  Avoid foods high in acid content ( tomatoes, citrus juices, spicy foods) . Avoid eating within 3 to 4 hours of lying down or before exercising. Do not over eat; try smaller more frequent meals.   3)  The medication list was reviewed and reconciled.  All changed / newly  prescribed medications were explained.  A complete medication list was provided to the patient / caregiver.  Appended Document: S/P EGD 2 WKS F/U/LAW Never received TCS report from [facility] done around 2005/2006. Please request again.   Appended Document: S/P EGD 2 WKS F/U/LAW REQUESTED AGAIN

## 2010-08-31 NOTE — Progress Notes (Signed)
Summary: Polyethylene Glycol Rx request  Phone Note Call from Patient Call back at Home Phone [phone]   Caller: Patient Call For: Evelena Peat MD Summary of Call: Pt here for B-12 monthly inj.  Pt requesting Rx for Polyethylene Glycol powder.  Pt has been struggling with constipation and has been using OTC laxitive weekly. [facility] Initial call taken by: Sid Falcon LPN,  October 01, 2009 11:41 AM  Follow-up for Phone Call        OK to refill. Follow-up by: Evelena Peat MD,  October 01, 2009 12:39 PM  Additional Follow-up for Phone Call Additional follow up Details #1::        Pt informed Rx filled. Additional Follow-up by: Sid Falcon LPN,  October 01, 2009 1:14 PM    New/Updated Medications: POLYETHYLENE GLYCOL 3350  POWD (POLYETHYLENE GLYCOL 3350) one capful daily as directed Prescriptions: POLYETHYLENE GLYCOL 3350  POWD (POLYETHYLENE GLYCOL 3350) one capful daily as directed  #255 x 3   Entered by:   Sid Falcon LPN   Authorized by:   Evelena Peat MD   Signed by:   Sid Falcon LPN on 62/13/0865   Method used:   Electronically to        [facility] (retail)       [address]       [place], [place]  [address]       Ph: [phone]       Fax: [phone]   RxID:   [id number]

## 2010-08-31 NOTE — Letter (Signed)
Summary: CT SCAN ORDER  CT SCAN ORDER   Imported By: Ave Filter 04/12/2010 15:16:56  _____________________________________________________________________  External Attachment:    Type:   Image     Comment:   External Document  Appended Document: CT SCAN ORDER [phone]

## 2010-08-31 NOTE — Assessment & Plan Note (Signed)
Summary: b12 inj./njr/PT RSC/CJR  Nurse Visit   Allergies: 1)  ! Demerol 2)  ! Motrin 3)  ! Darvocet 4)  ! Morphine 5)  ! Darvon 6)  ! Caffeine 7)  ! Bactrim 8)  ! Penicillin V Potassium (Penicillin V Potassium)  Medication Administration  Injection # 1:    Medication: Vit B12 1000 mcg    Diagnosis: ANEMIA, CHRONIC (ICD-281.9)    Route: IM    Site: R deltoid    Exp Date: 10/31/2011    Lot #: [id number]    Mfr: app pharm    Patient tolerated injection without complications    Given by: Kathrynn Speed CMA (January 07, 2010 11:43 AM)  Orders Added: 1)  Vit B12 1000 mcg [J3420]

## 2010-08-31 NOTE — Progress Notes (Signed)
Summary: Nexium rx at new  pharmacy X 1 year  Phone Note Call from Patient Call back at Home Phone [phone]   Caller: Patient---live call Reason for Call: Refill Medication Summary of Call: Please send a new rx for Nexium 40 mg bid  to  her new pharmacy-----[facility]  in [address]. ph---[phone]. Please update pharmacy info in chart. Initial call taken by: Warnell Forester,  September 14, 2009 9:33 AM  Follow-up for Phone Call        Rx sent, pt informed Follow-up by: Sid Falcon LPN,  September 14, 2009 10:51 AM    Prescriptions: NEXIUM 40 MG CPDR (ESOMEPRAZOLE MAGNESIUM) one tab two times a day  #60 x 11   Entered by:   Sid Falcon LPN   Authorized by:   Evelena Peat MD   Signed by:   Sid Falcon LPN on 14/78/2956   Method used:   Electronically to        [facility] (retail)       [address]       [place], [place]  [address]       Ph: [phone]       Fax: [phone]   RxID:   [id number]

## 2010-08-31 NOTE — Letter (Signed)
Summary: OP REPORT FROM [facility]  OP REPORT FROM [facility]   Imported By: Rexene Alberts 05/20/2010 15:25:26  _____________________________________________________________________  External Attachment:    Type:   Image     Comment:   External Document

## 2010-08-31 NOTE — Assessment & Plan Note (Signed)
Summary: b12 with nancy//ccm  Nurse Visit   Allergies: 1)  ! Demerol 2)  ! Motrin 3)  ! Darvocet 4)  ! Morphine 5)  ! Darvon 6)  ! Caffeine 7)  ! Bactrim 8)  ! Penicillin V Potassium (Penicillin V Potassium)  Medication Administration  Injection # 1:    Medication: Vit B12 1000 mcg    Diagnosis: ANEMIA, NORMOCYTIC, CHRONIC (ICD-285.9)    Route: IM    Site: L deltoid    Exp Date: 04/02/2011    Lot #: [id number]    Mfr: American Regent    Patient tolerated injection without complications    Given by: Sid Falcon LPN (October 02, 9602 11:37 AM)  Orders Added: 1)  Vit B12 1000 mcg [J3420] 2)  Admin of Therapeutic Inj  intramuscular or subcutaneous [54098]

## 2010-08-31 NOTE — Assessment & Plan Note (Signed)
Summary: 3 month rov/b12 inj/njr   Vital Signs:  Patient profile:   77 year old female Menstrual status:  postmenopausal Weight:      147 pounds Temp:     97.7 degrees F oral BP sitting:   122 / 80  (left arm) Cuff size:   regular  Vitals Entered By: Sid Falcon LPN (March 04, 2010 1:30 PM)  History of Present Illness: Patient here for the following issues  History of B12 deficiency and needs injection. Recent levels were normal.  Long history of reflux. Has recently been eating several tomatoes up to 3 times per day and has had flareup of reflux symptoms. Symptoms seem to be worse at night. Burning sensation midepigastric region. No hematemesis or any stool changes. Taking Nexium 40 mg b.i.d.  Back pain from last visit has improved with chiropractic care. X-rays did not reveal any acute changes. She has moderate degenerative changes with spondylolisthesis L5-S1 level.  Hypertension is stable on amlodipine. She has some mild lower extremity edema which is overall stable. No orthopnea or dyspnea. Denies chest pain  Hypertension History:      She complains of peripheral edema, but denies headache, chest pain, palpitations, dyspnea with exertion, orthopnea, PND, visual symptoms, neurologic problems, syncope, and side effects from treatment.        Positive major cardiovascular risk factors include female age 67 years old or older and hypertension.  Negative major cardiovascular risk factors include non-tobacco-user status.     Allergies: 1)  ! Demerol 2)  ! Motrin 3)  ! Darvocet 4)  ! Morphine 5)  ! Darvon 6)  ! Caffeine 7)  ! Bactrim 8)  ! Penicillin V Potassium (Penicillin V Potassium)  Past History:  Past Medical History: Last updated: 11/12/2009 GERD / Esophageal strictures Allergic rhinitis Arthritis Diverticulosis Hypertension UTI, recurrent osteoporosis Lumbar stenosis Chronic normocytic anemia  Past Surgical History: Last updated:  10/21/2008 Appendectomy 1960 Cholecystectomy 1999 Hysterectomy 1969 Tonsillectomy 1954  Family History: Last updated: 2009/06/14 Father: deceased 88 Leukemia Mother: deceased 17 Staph Infection after hip replacement; HTN 4 brothers: DM, COPD, reflux, Multiple Myeloma 1 child: healthy Family History of Prostate Cancer:Brothers x2  Social History: Last updated: 02/05/2007 Divorced Former Smoker Alcohol use-no Drug use-no Retired: Corporate treasurer PMH-FH-SH reviewed for relevance  Review of Systems       The patient complains of severe indigestion/heartburn.  The patient denies anorexia, fever, weight loss, hoarseness, chest pain, syncope, dyspnea on exertion, prolonged cough, headaches, hemoptysis, abdominal pain, melena, and hematochezia.    Physical Exam  General:  Well-developed,well-nourished,in no acute distress; alert,appropriate and cooperative throughout examination Eyes:  pupils equal, pupils round, and pupils reactive to light.   Ears:  External ear exam shows no significant lesions or deformities.  Otoscopic examination reveals clear canals, tympanic membranes are intact bilaterally without bulging, retraction, inflammation or discharge. Hearing is grossly normal bilaterally. Mouth:  Oral mucosa and oropharynx without lesions or exudates.  Teeth in good repair. Neck:  No deformities, masses, or tenderness noted. Lungs:  Normal respiratory effort, chest expands symmetrically. Lungs are clear to auscultation, no crackles or wheezes. Heart:  normal rate and regular rhythm.   Abdomen:  soft with mild midepigastric tenderness. Msk:  patient has kyphotic posture. No specific point tenderness. Extremities:  trace nonpitting edema lower legs bilaterally. Feet are warm to touch. 2 plus dorsalis pedis pulses bilaterally Skin:  no rashes and no suspicious lesions.   Psych:  normally interactive, good eye contact, not anxious appearing,  and not depressed appearing.      Impression & Recommendations:  Problem # 1:  REFLUX ESOPHAGITIS (ICD-530.11) Assessment Deteriorated add short term use of Pepcid or Zantac.  Leave off offending foods.  Problem # 2:  HYPERTENSION (ICD-401.9)  Her updated medication list for this problem includes:    Amlodipine Besylate 5 Mg Tabs (Amlodipine besylate) ..... One by mouth once daily  Problem # 3:  BACK PAIN, LUMBAR (ICD-724.2) Assessment: Improved  Problem # 4:  ANEMIA, CHRONIC (ICD-281.9)  Her updated medication list for this problem includes:    Ferrous Sulfate 325 (65 Fe) Mg Tbec (Ferrous sulfate) ..... One daily    Cyanocobalamin 1000 Mcg/ml Soln (Cyanocobalamin) ..... Inject one ml im monthly  Orders: Vit B12 1000 mcg (J3420) Admin of Therapeutic Inj  intramuscular or subcutaneous (04540)  Complete Medication List: 1)  Caltrate 600+d 600-400 Mg-unit Tabs (Calcium carbonate-vitamin d) .... Two times a day 2)  Vit C 500mg   .... One daily 3)  Ferrous Sulfate 325 (65 Fe) Mg Tbec (Ferrous sulfate) .... One daily 4)  Nexium 40 Mg Cpdr (Esomeprazole magnesium) .... One tab two times a day 5)  Multi-day Plus Iron Tabs (Multiple vitamins-iron) .... Once daily 6)  Vitamin D 2000 Unit Tabs (Cholecalciferol) .... Once daily 7)  Amlodipine Besylate 5 Mg Tabs (Amlodipine besylate) .... One by mouth once daily 8)  Cyanocobalamin 1000 Mcg/ml Soln (Cyanocobalamin) .... Inject one ml im monthly 9)  Calcitonin (salmon) 200 Unit/act Soln (Calcitonin (salmon)) .... One spray to alternate nostril daily 10)  Astelin 137 Mcg/spray Soln (Azelastine hcl) .Marland Kitchen.. 1-2 sprays per nostril two times a day as needed 11)  Polyethylene Glycol 3350 Powd (Polyethylene glycol 3350) .... One capful daily as directed 12)  Cephalexin 500 Mg Caps (Cephalexin) .... One by mouth three times a day for 7 days  Hypertension Assessment/Plan:      The patient's hypertensive risk group is category B: At least one risk factor (excluding diabetes) with no  target organ damage.  Her calculated 10 year risk of coronary heart disease is 11 %.  Today's blood pressure is 122/80.  Her blood pressure goal is < 140/90.   Patient Instructions: 1)  Avoid further consumption of tomatoes or any other acidic foods. 2)  Add Zantac of Pepcid to Nexium for the next few days.   Medication Administration  Injection # 1:    Medication: Vit B12 1000 mcg    Diagnosis: ANEMIA, CHRONIC (ICD-281.9)    Route: IM    Site: R deltoid    Exp Date: 09/02/2011    Lot #: [id number]    Mfr: American Regent    Patient tolerated injection without complications    Given by: Sid Falcon LPN (March 04, 2010 3:34 PM)  Orders Added: 1)  Vit B12 1000 mcg [J3420] 2)  Admin of Therapeutic Inj  intramuscular or subcutaneous [96372] 3)  Est. Patient Level IV [98119]

## 2010-08-31 NOTE — Assessment & Plan Note (Signed)
Summary: bladder inf/b-12 inj/cjr PT RSC/NJR   Vital Signs:  Patient profile:   77 year old female Menstrual status:  postmenopausal Weight:      145 pounds Temp:     97.6 degrees F oral BP sitting:   102 / 70  (left arm) Cuff size:   regular  Vitals Entered By: Duard Brady LPN (February 04, 1609 1:14 PM) CC: c/o urine freq. and burning Is Patient Diabetic? No   History of Present Illness: Patient seen today with almost one week history of frequency and burning with urination. Denies any fevers or chills. She has some chronic low back pains which seem to be unrelated. Many urine infections during the past year. Has had urologic workup. No etiology found.  Patient has B12 deficiency also needs B12 injection today  Low back pains have been chronic and progressive.  Dxed with lumbar stenosis in past but no x-rays in several years.  Moderate to severe sharp to achy pain lower lumbar with occ radiation into buttocks. Possibly worse with walking and also change of position.  Denies any recent falls.  No numbness or incontinence.  Feels very weak in both legs.  pt also has hx of osteoporosis.  Preventive Screening-Counseling & Management  Alcohol-Tobacco     Smoking Status: never  Allergies: 1)  ! Demerol 2)  ! Motrin 3)  ! Darvocet 4)  ! Morphine 5)  ! Darvon 6)  ! Caffeine 7)  ! Bactrim 8)  ! Penicillin V Potassium (Penicillin V Potassium)  Past History:  Past Medical History: Last updated: 11/12/2009 GERD / Esophageal strictures Allergic rhinitis Arthritis Diverticulosis Hypertension UTI, recurrent osteoporosis Lumbar stenosis Chronic normocytic anemia PMH reviewed for relevance  Social History: Smoking Status:  never  Review of Systems       The patient complains of muscle weakness.  The patient denies anorexia, fever, weight loss, abdominal pain, incontinence, and enlarged lymph nodes.    Physical Exam  General:  Well-developed,well-nourished,in no  acute distress; alert,appropriate and cooperative throughout examination Mouth:  Oral mucosa and oropharynx without lesions or exudates.  Teeth in good repair. Neck:  No deformities, masses, or tenderness noted. Lungs:  Normal respiratory effort, chest expands symmetrically. Lungs are clear to auscultation, no crackles or wheezes. Heart:  normal rate and regular rhythm.   Msk:  no CVA tenderness no spinal tenderness. SLRs neg bilaterally. Pt has kyphotic posture. Extremities:  no signif pitting edema. Neurologic:  no focal strength defecits.  Good strength with plantar and dorsi flexion.  strength normal in all extremities and DTRs symmetrical and normal.     Impression & Recommendations:  Problem # 1:  URINARY TRACT INFECTION, RECURRENT (ICD-599.0)  Her updated medication list for this problem includes:    Cephalexin 500 Mg Caps (Cephalexin) ..... One by mouth three times a day for 7 days  Orders: T-Culture, Urine ([id number])  Problem # 2:  BACK PAIN, LUMBAR (ICD-724.2) Assessment: Deteriorated hx lumbar stenosis.  repeat back x-rays.  Consider PT vs refer for consideration of epidural steroids.  May need repeat MRI first. Orders: T-Lumbar Spine Complete, 5 Views (71110TC)  Complete Medication List: 1)  Caltrate 600+d 600-400 Mg-unit Tabs (Calcium carbonate-vitamin d) .... Two times a day 2)  Vit C 500mg   .... One daily 3)  Ferrous Sulfate 325 (65 Fe) Mg Tbec (Ferrous sulfate) .... One daily 4)  Nexium 40 Mg Cpdr (Esomeprazole magnesium) .... One tab two times a day 5)  Multi-day Plus Iron Tabs (Multiple vitamins-iron) .Marland KitchenMarland KitchenMarland Kitchen  Once daily 6)  Vitamin D 2000 Unit Tabs (Cholecalciferol) .... Once daily 7)  Amlodipine Besylate 5 Mg Tabs (Amlodipine besylate) .... One by mouth once daily 8)  Cyanocobalamin 1000 Mcg/ml Soln (Cyanocobalamin) .... Inject one ml im monthly 9)  Calcitonin (salmon) 200 Unit/act Soln (Calcitonin (salmon)) .... One spray to alternate nostril daily 10)   Astelin 137 Mcg/spray Soln (Azelastine hcl) .Marland Kitchen.. 1-2 sprays per nostril two times a day as needed 11)  Polyethylene Glycol 3350 Powd (Polyethylene glycol 3350) .... One capful daily as directed 12)  Cephalexin 500 Mg Caps (Cephalexin) .... One by mouth three times a day for 7 days  Other Orders: UA Dipstick w/o Micro (manual) (16109) Vit B12 1000 mcg (J3420) Admin of Therapeutic Inj  intramuscular or subcutaneous (60454)  Patient Instructions: 1)  Drink plenty of fluids up to 3-4 quarts a day. Cranberry juice is especially recommended in addition to large amounts of water. Avoid caffeine & carbonated drinks, they tend to irritate the bladder, Return in 3-5 days if you're not better: sooner if you're feeling worse.  Prescriptions: CEPHALEXIN 500 MG CAPS (CEPHALEXIN) one by mouth three times a day for 7 days  #21 x 1   Entered and Authorized by:   Evelena Peat MD   Signed by:   Evelena Peat MD on 02/04/2010   Method used:   Electronically to        [facility] (retail)       [address]       [place], [place]  [address]       Ph: [phone]       Fax: [phone]   RxID:   [id number]   Laboratory Results   Urine Tests  Date/Time Received: February 04, 2010 1:23 PM  Date/Time Reported: February 04, 2010 1:23 PM   Routine Urinalysis   Color: yellow Appearance: Clear Glucose: negative   (Normal Range: Negative) Bilirubin: negative   (Normal Range: Negative) Ketone: negative   (Normal Range: Negative) Spec. Gravity: 1.015   (Normal Range: 1.003-1.035) Blood: negative   (Normal Range: Negative) pH: 5.0   (Normal Range: 5.0-8.0) Protein: negative   (Normal Range: Negative) Urobilinogen: 0.2   (Normal Range: 0-1) Nitrite: negative   (Normal Range: Negative) Leukocyte Esterace: moderate   (Normal Range: Negative)         Medication Administration  Injection # 1:    Medication: Vit B12 1000 mcg    Diagnosis: ANEMIA, CHRONIC  (ICD-281.9)    Route: IM    Site: R deltoid    Exp Date: 09/2011    Lot #: [id number]    Mfr: [employer]    Patient tolerated injection without complications    Given by: Duard Brady LPN (February 05, 2724 1:24 PM)  Orders Added: 1)  UA Dipstick w/o Micro (manual) [81002] 2)  Vit B12 1000 mcg [J3420] 3)  Admin of Therapeutic Inj  intramuscular or subcutaneous [96372] 4)  Est. Patient Level III [36644] 5)  T-Culture, Urine [[id number]] 6)  T-Lumbar Spine Complete, 5 Views [71110TC] 7)  Est. Patient Level IV [38756]

## 2010-08-31 NOTE — Progress Notes (Signed)
Summary: less than 24 hr no show policy  Phone Note Call from Patient Call back at Home Phone [phone]   Caller: Patient Call For: Evelena Peat MD Summary of Call: PT Cancelled today ov due to sickness Initial call taken by: Heron Sabins,  July 06, 2010 9:03 AM  Follow-up for Phone Call        noted Follow-up by: Evelena Peat MD,  July 06, 2010 1:00 PM

## 2010-08-31 NOTE — Letter (Signed)
Summary: [facility]  [facility]   Imported By: Maryln Gottron 09/04/2009 10:47:48  _____________________________________________________________________  External Attachment:    Type:   Image     Comment:   External Document

## 2010-08-31 NOTE — Miscellaneous (Signed)
Clinical Lists Changes  NAME:  Gloria [name], Gloria [name]                 ACCOUNT NO.:  [id number]      MEDICAL RECORD NO.:  [id number]         PATIENT TYPE:  POBV      LOCATION:  A331                          FACILITY:  [facility]      PHYSICIAN:  R. Roetta Sessions, M.D. DATE OF BIRTH:  [birth year]      DATE OF CONSULTATION:  03/18/2010   DATE OF DISCHARGE:                                    CONSULTATION      REASON FOR CONSULTATION:  Chest pain/reflux and progressive esophageal   dysphagia.      HISTORY OF PRESENT ILLNESS:  This is a very pleasant 77 year old lady   who was admitted to the hospital on March 17, 2010, with progressive   chest pain and chest pressure.  She has a long history of   gastroesophageal reflux disease and has had a difficult time tolerating   and getting efficacy from a multitude of PPIs (see below).  She tells me   that her reflux symptoms have worsened recently and also she has had   progressive esophageal dysphagia to solid food.  She presented to   emergency department yesterday and got a complete relief with the GI   cocktail in the emergency department.      She has remained hemodynamically stable during this hospitalization.   There has been no evidence of GI bleeding.  She has basically been   evaluated for acute coronary syndrome and pulmonary embolus.  A CT   angiography of the chest demonstrated no evidence of pulmonary embolism,   some cardiomegaly.  T5 compression deformity, nonspecific upper lobe   nodules, interim followup CT was recommended per Radiology.  Cardiac   enzymes negative x3.  EKG demonstrated normal sinus rhythm without any   acute changes.  She has been treated with IV Protonix since admission   here.      [name] tells me she has a long history of gastroesophageal reflux   disease.  She has seen Dr. Sheryn Bison with [facility] down in   [place] on multiple occasions.  She describes undergoing 3 EGDs and   dilations in the past, the last  being in the fall of 2010.  She tells me   she gets relief of her dysphagia after dilation.  She has a history of   peptic stricture.  She has had difficulties with various acid   suppressing agents.  She relates being on Nexium more recently with her   reflux symptoms becoming somewhat refractory to even dose of Nexium 40   mg orally twice daily which she had been taking recently.  She has a   history of chronic B12 deficiency and anemia, prior esophageal manometry   done in [place] demonstrated low LES pressure, but there was no   evidence of achalasia, ambulatory pH probe study also previously   demonstrated a high DeMeester score, however, I am not sure whether or   not the study was done on or off acid suppression therapy.  She does not   have true  odynophagia.  She has not had any early satiety, nausea, or   vomiting.      The patient states that Dr. Jarold Motto has her up-to-date on her   colonoscopy her last exam being 2 or 3 years ago without significant   findings reported.      PAST MEDICAL HISTORY:  Significant for hypertension, anemia, B12   deficiency, gastritis, GERD, and osteoporosis.      PAST SURGICAL HISTORY:  History of appendectomy, cholecystectomy,   tonsillectomy.      MEDICATIONS:  Vitamin C, furosemide, polyethylene glycol at bedtime as   needed for constipation, Astelin nasal spray, calcitonin,   cyanocobalamin, amlodipine, vitamin D, multivitamin, Nexium, ferrous   sulfate, and calcium carbonate.      ALLERGIES:  DARVOCET, DEMEROL, MORPHINE, MOTRIN, PENICILLIN, and   FENTANYL.      FAMILY HISTORY:  Negative for chronic GI or liver disease, although her   brother has a history of Schatzki's ring requiring dilation previously.      SOCIAL HISTORY:  The patient does not use alcohol or tobacco products.   She lives in [place] by herself and does very well caring for   herself.      REVIEW OF SYSTEMS:  As in history of present illness.      PHYSICAL  EXAMINATION:  GENERAL:  A very pleasant 77 year old lady   resting comfortably.  No acute distress.   VITAL SIGNS:  Temperature 97.5, pulse 90, respiratory rate 19, BP   111/75.   SKIN:  Warm and dry.   HEENT:  No scleral icterus.  Conjunctivae pink.   CHEST:  Lungs are clear to auscultation.   CARDIAC:  Regular rate and rhythm without murmur, gallop, or rub.   BREASTS:  Deferred.   ABDOMEN:  Nondistended.  Positive bowel sounds, soft, nontender without   appreciable mass or organomegaly.   EXTREMITIES: No edema.      ADDITIONAL LABORATORY DATA:  White count 4100,  H and H 9.3 and 27.3,   MCV 90.2, platelet count 260,000.  BNP normal at 76.9.  Sodium 137,   potassium 4.2, chloride 107, CO2 25, glucose 104, BUN 25, creatinine   0.82, calcium 8.8.      IMPRESSION:  Ms. Gloria [name] is a pleasant 77 year old admitted to   hospital with atypical chest pain in the setting of worsening typical   gastroesophageal reflux disease, progressive esophageal dysphagia to   solids, myocardial infarction has been ruled out.  Pulmonary embolus has   been ruled out.  She has nonspecific pulmonary nodules on CT which will   be followed up per Hospitalist Service.      She has a compelling history of longstanding gastroesophageal reflux   disease with peptic stricture requiring multiple dilations in the past.   She typically enjoyed significant improvement for some period of time   after esophagus is dilated.  She does report somewhat disturbing   observation that even taking Nexium 40 mg orally twice daily has not   adequate control of her reflux symptoms moreover, and reviewing Dr.   Norval Gable records and films that she has had problems with somewhat   refractory reflux symptoms to a variety of acid suppressing agents.  She   did appear to have an abnormal probe study previously.  Prior esophageal   manometry demonstrated a low resting LES pressure but no evidence of   achalasia.      I wonder if  the patient started having some intermittent pill-induced  esophageal injury in the setting of a peptic stricture, at any rate   further GI evaluation is warranted.      She does have allergies to both DEMEROL and FENTANYL which may make   conscious sedation a challenge.      RECOMMENDATIONS:   1. Agree with proton pump inhibitor therapy at the time being.   2. I have offered this lady an EGD with esophageal dilation as       appropriate tomorrow.  Risks, benefits, limitations, alternatives,       and imponderables have been reviewed.  I will try and get the last       EGD note (the patient states last EGD went well and the patient       reports, Dr. Jarold Motto used special combination of conscious       sedation).  If there remains a question, we will consult with       anesthesia, but have a little threshold just for giving her       propofol.      Further recommendations to follow.      I would like to thank Hospitalist Service for allowing me to see this   very nice lady today.               Jonathon Bellows, M.D.            RMR/MEDQ  D:  03/18/2010  T:  03/19/2010  Job:  [id number]      cc:   Evelena Peat, M.D.      Electronically Signed by Lorrin Goodell M.D. on 03/21/2010 02:49:31 PM

## 2010-08-31 NOTE — Miscellaneous (Signed)
Summary: hospital labs 06/01/10  Clinical Lists Changes  Observations: Added new observation of ABSOLUTE BAS: 0.0 K/uL (06/01/2010 10:31) Added new observation of BASOPHIL %: 1 % (06/01/2010 10:31) Added new observation of EOS ABSLT: 0.4 K/uL (06/01/2010 10:31) Added new observation of % EOS AUTO: 8 % (06/01/2010 10:31) Added new observation of ABSOLUTE MON: 0.4 K/uL (06/01/2010 10:31) Added new observation of MONOCYTE %: 10 % (06/01/2010 10:31) Added new observation of ABS LYMPHOCY: 1.2 K/uL (06/01/2010 10:31) Added new observation of LYMPHS %: 26 % (06/01/2010 10:31) Added new observation of ABS NEUTROPH: 2.6 K/uL (06/01/2010 10:31) Added new observation of PLATELETK/UL: 234 K/uL (06/01/2010 10:31) Added new observation of RDW: 14.0 % (06/01/2010 10:31) Added new observation of MCHC RBC: 29.8 g/dL (16/05/9603 54:09) Added new observation of MCV: 88.7 fL (06/01/2010 10:31) Added new observation of HCT: 26.6 % (06/01/2010 10:31) Added new observation of HGB: 8.9 g/dL (81/19/1478 29:56) Added new observation of RBC M/UL: 3.00 M/uL (06/01/2010 10:31) Added new observation of WBC COUNT: 4.6 10*3/microliter (06/01/2010 10:31)

## 2010-08-31 NOTE — Assessment & Plan Note (Signed)
Summary: ? bladder infection//ccm   Vital Signs:  Patient profile:   77 year old female Menstrual status:  postmenopausal Temp:     97.4 degrees F oral BP sitting:   138 / 78  (left arm) Cuff size:   regular  Vitals Entered By: Sid Falcon LPN (November 12, 2009 9:44 AM) CC: bladder pressure, ? infection   History of Present Illness: One-day history urinary symptoms of frequency and burning. History frequent UTIs.  Full urologic workup this past year unrevealing. Current symptoms similar to past symptoms. No fever, nausea, vomiting, or back pain. Intolerant to many antibiotics. Has tolerated Keflex.  Allergies: 1)  ! Demerol 2)  ! Motrin 3)  ! Darvocet 4)  ! Morphine 5)  ! Darvon 6)  ! Caffeine 7)  ! Bactrim 8)  ! Penicillin V Potassium (Penicillin V Potassium)  Past History:  Past Medical History: GERD / Esophageal strictures Allergic rhinitis Arthritis Diverticulosis Hypertension UTI, recurrent osteoporosis Lumbar stenosis Chronic normocytic anemia PMH reviewed for relevance  Review of Systems  The patient denies anorexia, fever, weight loss, hematuria, and incontinence.    Physical Exam  General:  Well-developed,well-nourished,in no acute distress; alert,appropriate and cooperative throughout examination Mouth:  Oral mucosa and oropharynx without lesions or exudates.  Teeth in good repair. Lungs:  Normal respiratory effort, chest expands symmetrically. Lungs are clear to auscultation, no crackles or wheezes. Heart:  normal rate and regular rhythm.   Msk:  no CVA tenderness   Impression & Recommendations:  Problem # 1:  URINARY TRACT INFECTION, RECURRENT (ICD-599.0) check urine cx. Her updated medication list for this problem includes:    Cephalexin 500 Mg Caps (Cephalexin) ..... One by mouth three times a day for 7 days  Orders: UA Dipstick w/o Micro (manual) (29562) T-Culture, Urine ([id number])  Complete Medication List: 1)  Caltrate 600+d  600-400 Mg-unit Tabs (Calcium carbonate-vitamin d) .... Two times a day 2)  Vit C 500mg   .... One daily 3)  Ferrous Sulfate 325 (65 Fe) Mg Tbec (Ferrous sulfate) .... One daily 4)  Nexium 40 Mg Cpdr (Esomeprazole magnesium) .... One tab two times a day 5)  Multi-day Plus Iron Tabs (Multiple vitamins-iron) .... Once daily 6)  Vitamin D 2000 Unit Tabs (Cholecalciferol) .... Once daily 7)  Amlodipine Besylate 5 Mg Tabs (Amlodipine besylate) .... One by mouth once daily 8)  Cyanocobalamin 1000 Mcg/ml Soln (Cyanocobalamin) .... Inject one ml im monthly 9)  Calcitonin (salmon) 200 Unit/act Soln (Calcitonin (salmon)) .... One spray to alternate nostril daily 10)  Astelin 137 Mcg/spray Soln (Azelastine hcl) .Marland Kitchen.. 1-2 sprays per nostril two times a day as needed 11)  Polyethylene Glycol 3350 Powd (Polyethylene glycol 3350) .... One capful daily as directed 12)  Cephalexin 500 Mg Caps (Cephalexin) .... One by mouth three times a day for 7 days  Patient Instructions: 1)  Drink plenty of fluids up to 3-4 quarts a day. Cranberry juice is especially recommended in addition to large amounts of water. Avoid caffeine & carbonated drinks, they tend to irritate the bladder, Return in 3-5 days if you're not better: sooner if you're feeling worse.  Prescriptions: CEPHALEXIN 500 MG CAPS (CEPHALEXIN) one by mouth three times a day for 7 days  #21 x 1   Entered and Authorized by:   Evelena Peat MD   Signed by:   Evelena Peat MD on 11/12/2009   Method used:   Electronically to        [facility] (retail)  [address]       [place], [place]  [address]       Ph: [phone]       Fax: [phone]   RxID:   [id number]   Laboratory Results   Urine Tests    Routine Urinalysis   Color: yellow Appearance: Hazy Glucose: negative   (Normal Range: Negative) Bilirubin: negative   (Normal Range: Negative) Ketone: negative   (Normal Range: Negative) Spec. Gravity:  1.010   (Normal Range: 1.003-1.035) Blood: trace-lysed   (Normal Range: Negative) pH: 6.0   (Normal Range: 5.0-8.0) Protein: 30   (Normal Range: Negative) Urobilinogen: 0.2   (Normal Range: 0-1) Nitrite: positive   (Normal Range: Negative) Leukocyte Esterace: large   (Normal Range: Negative)    Comments: Sid Falcon LPN  November 12, 2009 9:56 AM

## 2010-08-31 NOTE — Assessment & Plan Note (Signed)
Summary: [facility]//CCM   Vital Signs:  Patient profile:   77 year old female Menstrual status:  postmenopausal Height:      62 inches Weight:      146 pounds BMI:     26.80 Temp:     97.9 degrees F oral Pulse rate:   92 / minute BP sitting:   120 / 80  (left arm) Cuff size:   regular  Vitals Entered By: Romualdo Bolk, CMA Duncan Dull) (March 10, 2010 2:33 PM)  Nutrition Counseling: Patient's BMI is greater than 25 and therefore counseled on weight management options. CC: Feet and legs swelling and gastritis   CC:  Feet and legs swelling and gastritis.  History of Present Illness: Patient seen for the following  Leg and feet edema. This started on Sunday. No history of CHF. Pedal edema early morning possibly worse late in the day. No excessive sodium consumption. Takes amlodipine 5 mg but has been on this for quite some time. Possibly some mild increased dyspnea recently. Denies any chest pains.  No clear orthopnea or PND.  No cough.  Recent episode of severe gastritis after increased consumption of certain foods, esp tomatoes.  Treated in ER with some type of GI cocktail last weekend that seemed to help. Her GI symptoms are improving at this time.  Preventive Screening-Counseling & Management  Alcohol-Tobacco     Alcohol drinks/day: 0     Smoking Status: never     Year Quit: 1983     Pack years: 32  Caffeine-Diet-Exercise     Caffeine use/day: 0     Does Patient Exercise: yes  Current Medications (verified): 1)  Caltrate 600+d 600-400 Mg-Unit  Tabs (Calcium Carbonate-Vitamin D) .... Two Times A Day 2)  Vit C 500mg  .... One Daily 3)  Ferrous Sulfate 325 (65 Fe) Mg  Tbec (Ferrous Sulfate) .... One Daily 4)  Nexium 40 Mg Cpdr (Esomeprazole Magnesium) .... One Tab Two Times A Day 5)  Multi-Day Plus Iron  Tabs (Multiple Vitamins-Iron) .... Once Daily 6)  Vitamin D 2000 Unit Tabs (Cholecalciferol) .... Once Daily 7)  Amlodipine Besylate 5 Mg Tabs (Amlodipine  Besylate) .... One By Mouth Once Daily 8)  Cyanocobalamin 1000 Mcg/ml Soln (Cyanocobalamin) .... Inject One Ml Im Monthly 9)  Calcitonin (Salmon) 200 Unit/act Soln (Calcitonin (Salmon)) .... One Spray To Alternate Nostril Daily 10)  Astelin 137 Mcg/spray Soln (Azelastine Hcl) .Marland Kitchen.. 1-2 Sprays Per Nostril Two Times A Day As Needed 11)  Polyethylene Glycol 3350  Powd (Polyethylene Glycol 3350) .... One Capful Daily As Directed  Allergies (verified): 1)  ! Demerol 2)  ! Motrin 3)  ! Darvocet 4)  ! Morphine 5)  ! Darvon 6)  ! Caffeine 7)  ! Bactrim 8)  ! Penicillin V Potassium (Penicillin V Potassium)  Past History:  Past Medical History: Last updated: 11/12/2009 GERD / Esophageal strictures Allergic rhinitis Arthritis Diverticulosis Hypertension UTI, recurrent osteoporosis Lumbar stenosis Chronic normocytic anemia  Past Surgical History: Last updated: 10/21/2008 Appendectomy 1960 Cholecystectomy 1999 Hysterectomy 1969 Tonsillectomy 1954  Social History: Last updated: 02/05/2007 Divorced Former Smoker Alcohol use-no Drug use-no Retired: Corporate treasurer PMH reviewed for relevance, PSH reviewed for relevance, SH/Risk Factors reviewed for relevance  Social History: Caffeine use/day:  0 Does Patient Exercise:  yes  Review of Systems       The patient complains of dyspnea on exertion and peripheral edema.  The patient denies anorexia, fever, weight loss, chest pain, syncope, prolonged cough, hemoptysis, abdominal  pain, and muscle weakness.    Physical Exam  General:  Well-developed,well-nourished,in no acute distress; alert,appropriate and cooperative throughout examination Head:  Normocephalic and atraumatic without obvious abnormalities. No apparent alopecia or balding. Mouth:  Oral mucosa and oropharynx without lesions or exudates.  Teeth in good repair. Neck:  No deformities, masses, or tenderness noted. Lungs:  patient has some rhonchi and possibly some faint  rales right base otherwise clear Heart:  normal rate, regular rhythm, and no gallop.   Extremities:  patient has 1+ pitting edema lower legs feet and ankles bilaterally. Neurologic:  alert & oriented X3, cranial nerves II-XII intact, and strength normal in all extremities.   Skin:  no rashes and no suspicious lesions.     Impression & Recommendations:  Problem # 1:  EDEMA LEG (ICD-782.3) Assessment New rather acute onset of unclear etiology.  Denies dietary change .  On amlodipine but symptoms more sudden.   Labs as below.  Urine to r/o proteinuria.  Start low dose furosemide 20 mg daily with elevation and reassess one week. Orders: UA Dipstick w/o Micro (manual) (60737) Venipuncture (10626) Specimen Handling (99000) TLB-BMP (Basic Metabolic Panel-BMET) (80048-METABOL) TLB-TSH (Thyroid Stimulating Hormone) (84443-TSH) TLB-BNP (B-Natriuretic Peptide) (83880-BNPR)  Her updated medication list for this problem includes:    Furosemide 20 Mg Tabs (Furosemide) ..... One by mouth once daily  Problem # 2:  REFLUX ESOPHAGITIS (ICD-530.11)  Complete Medication List: 1)  Caltrate 600+d 600-400 Mg-unit Tabs (Calcium carbonate-vitamin d) .... Two times a day 2)  Vit C 500mg   .... One daily 3)  Ferrous Sulfate 325 (65 Fe) Mg Tbec (Ferrous sulfate) .... One daily 4)  Nexium 40 Mg Cpdr (Esomeprazole magnesium) .... One tab two times a day 5)  Multi-day Plus Iron Tabs (Multiple vitamins-iron) .... Once daily 6)  Vitamin D 2000 Unit Tabs (Cholecalciferol) .... Once daily 7)  Amlodipine Besylate 5 Mg Tabs (Amlodipine besylate) .... One by mouth once daily 8)  Cyanocobalamin 1000 Mcg/ml Soln (Cyanocobalamin) .... Inject one ml im monthly 9)  Calcitonin (salmon) 200 Unit/act Soln (Calcitonin (salmon)) .... One spray to alternate nostril daily 10)  Astelin 137 Mcg/spray Soln (Azelastine hcl) .Marland Kitchen.. 1-2 sprays per nostril two times a day as needed 11)  Polyethylene Glycol 3350 Powd (Polyethylene glycol  3350) .... One capful daily as directed 12)  Furosemide 20 Mg Tabs (Furosemide) .... One by mouth once daily  Patient Instructions: 1)  Elevate feet and legs frequently 2)  Reduce sodium intake 3)  Schedule followup appointment in one week 4)  Eat plenty of potassium rich foods such as bananas,  and other fruits Prescriptions: FUROSEMIDE 20 MG TABS (FUROSEMIDE) one by mouth once daily  #30 x 0   Entered and Authorized by:   Evelena Peat MD   Signed by:   Evelena Peat MD on 03/10/2010   Method used:   Electronically to        [facility] [address] (retail)       [address]       [place], [place]  [address]       Ph: [phone]       Fax: [phone]   RxID:   [id number]   Laboratory Results   Urine Tests    Routine Urinalysis   Color: yellow Appearance: Clear Glucose: negative   (Normal Range: Negative) Bilirubin: negative   (Normal Range: Negative) Ketone: negative   (Normal Range: Negative) Spec. Gravity: 1.010   (  Normal Range: 1.003-1.035) Blood: negative   (Normal Range: Negative) pH: 5.0   (Normal Range: 5.0-8.0) Protein: negative   (Normal Range: Negative) Urobilinogen: 0.2   (Normal Range: 0-1) Nitrite: negative   (Normal Range: Negative) Leukocyte Esterace: negative   (Normal Range: Negative)    Comments: Rita Ohara  March 10, 2010 3:56 PM

## 2010-08-31 NOTE — Miscellaneous (Signed)
Summary: consult note  Clinical Lists Changes  NAME:  Gloria [name], Gloria [name]                 ACCOUNT NO.:  [id number]      MEDICAL RECORD NO.:  [id number]         PATIENT TYPE:  POBV      LOCATION:  A331                          FACILITY:  [facility]      PHYSICIAN:  R. Roetta Sessions, M.D. DATE OF BIRTH:  [birth year]      DATE OF CONSULTATION:  03/18/2010   DATE OF DISCHARGE:                                    CONSULTATION      REASON FOR CONSULTATION:  Chest pain/reflux and progressive esophageal   dysphagia.      HISTORY OF PRESENT ILLNESS:  This is a very pleasant 77 year old lady   who was admitted to the hospital on March 17, 2010, with progressive   chest pain and chest pressure.  She has a long history of   gastroesophageal reflux disease and has had a difficult time tolerating   and getting efficacy from a multitude of PPIs (see below).  She tells me   that her reflux symptoms have worsened recently and also she has had   progressive esophageal dysphagia to solid food.  She presented to   emergency department yesterday and got a complete relief with the GI   cocktail in the emergency department.      She has remained hemodynamically stable during this hospitalization.   There has been no evidence of GI bleeding.  She has basically been   evaluated for acute coronary syndrome and pulmonary embolus.  A CT   angiography of the chest demonstrated no evidence of pulmonary embolism,   some cardiomegaly.  T5 compression deformity, nonspecific upper lobe   nodules, interim followup CT was recommended per Radiology.  Cardiac   enzymes negative x3.  EKG demonstrated normal sinus rhythm without any   acute changes.  She has been treated with IV Protonix since admission   here.      [name] tells me she has a long history of gastroesophageal reflux   disease.  She has seen Dr. Sheryn Bison with [facility] down in   [place] on multiple occasions.  She describes undergoing 3 EGDs and   dilations in the past, the last being in the fall of 2010.  She tells me   she gets relief of her dysphagia after dilation.  She has a history of   peptic stricture.  She has had difficulties with various acid   suppressing agents.  She relates being on Nexium more recently with her   reflux symptoms becoming somewhat refractory to even dose of Nexium 40   mg orally twice daily which she had been taking recently.  She has a   history of chronic B12 deficiency and anemia, prior esophageal manometry   done in [place] demonstrated low LES pressure, but there was no   evidence of achalasia, ambulatory pH probe study also previously   demonstrated a high DeMeester score, however, I am not sure whether or   not the study was done on or off acid suppression therapy.  She does not  have true odynophagia.  She has not had any early satiety, nausea, or   vomiting.      The patient states that Dr. Jarold Motto has her up-to-date on her   colonoscopy her last exam being 2 or 3 years ago without significant   findings reported.      PAST MEDICAL HISTORY:  Significant for hypertension, anemia, B12   deficiency, gastritis, GERD, and osteoporosis.      PAST SURGICAL HISTORY:  History of appendectomy, cholecystectomy,   tonsillectomy.      MEDICATIONS:  Vitamin C, furosemide, polyethylene glycol at bedtime as   needed for constipation, Astelin nasal spray, calcitonin,   cyanocobalamin, amlodipine, vitamin D, multivitamin, Nexium, ferrous   sulfate, and calcium carbonate.      ALLERGIES:  DARVOCET, DEMEROL, MORPHINE, MOTRIN, PENICILLIN, and   FENTANYL.      FAMILY HISTORY:  Negative for chronic GI or liver disease, although her   brother has a history of Schatzki's ring requiring dilation previously.      SOCIAL HISTORY:  The patient does not use alcohol or tobacco products.   She lives in [place] by herself and does very well caring for   herself.      REVIEW OF SYSTEMS:  As in history of  present illness.      PHYSICAL EXAMINATION:  GENERAL:  A very pleasant 77 year old lady   resting comfortably.  No acute distress.   VITAL SIGNS:  Temperature 97.5, pulse 90, respiratory rate 19, BP   111/75.   SKIN:  Warm and dry.   HEENT:  No scleral icterus.  Conjunctivae pink.   CHEST:  Lungs are clear to auscultation.   CARDIAC:  Regular rate and rhythm without murmur, gallop, or rub.   BREASTS:  Deferred.   ABDOMEN:  Nondistended.  Positive bowel sounds, soft, nontender without   appreciable mass or organomegaly.   EXTREMITIES: No edema.      ADDITIONAL LABORATORY DATA:  White count 4100,  H and H 9.3 and 27.3,   MCV 90.2, platelet count 260,000.  BNP normal at 76.9.  Sodium 137,   potassium 4.2, chloride 107, CO2 25, glucose 104, BUN 25, creatinine   0.82, calcium 8.8.      IMPRESSION:  [name] is a pleasant 77 year old admitted to   hospital with atypical chest pain in the setting of worsening typical   gastroesophageal reflux disease, progressive esophageal dysphagia to   solids, myocardial infarction has been ruled out.  Pulmonary embolus has   been ruled out.  She has nonspecific pulmonary nodules on CT which will   be followed up per Hospitalist Service.      She has a compelling history of longstanding gastroesophageal reflux   disease with peptic stricture requiring multiple dilations in the past.   She typically enjoyed significant improvement for some period of time   after esophagus is dilated.  She does report somewhat disturbing   observation that even taking Nexium 40 mg orally twice daily has not   adequate control of her reflux symptoms moreover, and reviewing Dr.   Norval Gable records and films that she has had problems with somewhat   refractory reflux symptoms to a variety of acid suppressing agents.  She   did appear to have an abnormal probe study previously.  Prior esophageal   manometry demonstrated a low resting LES pressure but no evidence of    achalasia.      I wonder if the patient started having some  intermittent pill-induced   esophageal injury in the setting of a peptic stricture, at any rate   further GI evaluation is warranted.      She does have allergies to both DEMEROL and FENTANYL which may make   conscious sedation a challenge.      RECOMMENDATIONS:   1. Agree with proton pump inhibitor therapy at the time being.   2. I have offered this lady an EGD with esophageal dilation as       appropriate tomorrow.  Risks, benefits, limitations, alternatives,       and imponderables have been reviewed.  I will try and get the last       EGD note (the patient states last EGD went well and the patient       reports, Dr. Jarold Motto used special combination of conscious       sedation).  If there remains a question, we will consult with       anesthesia, but have a little threshold just for giving her       propofol.      Further recommendations to follow.      I would like to thank Hospitalist Service for allowing me to see this   very nice lady today.               Jonathon Bellows, M.D.            RMR/MEDQ  D:  03/18/2010  T:  03/19/2010  Job:  [id number]      cc:   Evelena Peat, M.D.      Electronically Signed by Lorrin Goodell M.D. on 03/21/2010 02:49:31 PM   Appended Document: consult note ov wirth me in 1 month

## 2010-08-31 NOTE — Assessment & Plan Note (Signed)
Summary: b12 inj/njr  Nurse Visit   Allergies: 1)  ! Demerol 2)  ! Motrin 3)  ! Darvocet 4)  ! Morphine 5)  ! Darvon 6)  ! Caffeine 7)  ! Bactrim 8)  ! Penicillin V Potassium (Penicillin V Potassium)  Medication Administration  Injection # 1:    Medication: Vit B12 1000 mcg    Diagnosis: ANEMIA, CHRONIC (ICD-281.9)    Route: IM    Site: R deltoid    Exp Date: 03/02/2011    Lot #: [id number]    Mfr: [employer] Regent    Patient tolerated injection without complications    Given by: Sid Falcon LPN (September 03, 2009 11:03 AM)  Orders Added: 1)  Vit B12 1000 mcg [J3420] 2)  Admin of Therapeutic Inj  intramuscular or subcutaneous [96045]

## 2010-08-31 NOTE — Assessment & Plan Note (Signed)
Summary: FUP AV:[id number]---[facility] // RS   Vital Signs:  Patient profile:   77 year old female Menstrual status:  postmenopausal Weight:      131 pounds O2 Sat:      96 % on Room air Temp:     98.6 degrees F oral BP sitting:   140 / 82  (left arm) Cuff size:   regular  Vitals Entered By: Sid Falcon LPN (June 11, 2010 1:32 PM)  O2 Flow:  Room air  History of Present Illness: Patient here for hospital followup. Refer to prior note. She presented here with generalized weakness and some cough and was diagnosed with left lower lobe pneumonia. She had progressive weakness and was admitted later that day with difficulty ambulating. No specific neurologic weakness.  Patient was diagnosed with left lower lobe pneumonia, also fractures right and left rib cage felt to be pathological related to osteoporosis. Patient has been intolerant of bisphosphonates in the past and has refused other medications such as Reclast.  Patient was treated with Avelox and off antibiotics at this time. Cough and fever have resolved. Recommendation for follow up chest x-ray. She did have small left lower lobe effusion.  Patient had transient atrial fibrillation and was started on digoxin, diltiazem, and amiloride. She was not felt to be a Coumadin candidate secondary history of anemia and frequent falls. She took herself off of diltiazem recently secondary to orthostatic symptoms. Thyroid functions were normal.  She has chronic anemia with hemoglobin around 9.5 range at baseline and 7.6 in the hospital and after transfusion discharge hemoglobin 8.9. Hemoccult negative in the hospital.  Patient received Dilaudid for pain control in hospital had slurred speech which resolved after Narcan.  Allergies: 1)  ! Demerol 2)  ! Motrin 3)  ! Darvocet 4)  ! Morphine 5)  ! Darvon 6)  ! Caffeine 7)  ! Bactrim 8)  ! Penicillin V Potassium (Penicillin V Potassium) 9)  ! Dilaudid (Hydromorphone Hcl)  Past  History:  Past Medical History: Last updated: 04-11-10 GERD / Esophageal strictures Allergic rhinitis Arthritis Diverticulosis Hypertension UTI, recurrent osteoporosis Lumbar stenosis Chronic normocytic anemia EGD/ED, 03/19/10--> Noncritical-appearing Schatzki's ring otherwise unremarkable esophagus status post passage of a 56-French Maloney dilator. Small hiatal hernia. Antral erythema with focal areaof adenomatous-appearing mucosa of uncertain significance status post biopsy. Path negative. B12 def     Past Surgical History: Last updated: 10/21/2008 Appendectomy 1960 Cholecystectomy 1999 Hysterectomy 1969 Tonsillectomy 1954  Family History: Last updated: 2010/04/11 Father: deceased 57 Leukemia Mother: deceased 63 Staph Infection after hip replacement; HTN 4 brothers: DM, COPD, reflux, Multiple Myeloma 1 child: healthy Family History of Prostate Cancer:Brothers x2 No FH of CRC.  Social History: Last updated: 02/05/2007 Divorced Former Smoker Alcohol use-no Drug use-no Retired: Corporate treasurer  Risk Factors: Alcohol Use: 0 (03/10/2010) Caffeine Use: 0 (03/10/2010) Exercise: yes (03/10/2010)  Risk Factors: Smoking Status: never (03/10/2010)  Review of Systems  The patient denies anorexia, fever, hoarseness, chest pain, syncope, dyspnea on exertion, peripheral edema, prolonged cough, hemoptysis, abdominal pain, melena, hematochezia, and severe indigestion/heartburn.    Physical Exam  General:  Well-developed,well-nourished,in no acute distress; alert,appropriate and cooperative throughout examination Mouth:  Oral mucosa and oropharynx without lesions or exudates.  Teeth in good repair. Neck:  No deformities, masses, or tenderness noted. Lungs:  decreased aeration left base. No rales. No wheezes. Right lung sounds clear Heart:  normal rate and regular rhythm.   Abdomen:  soft and non-tender.   Extremities:  no pitting edema  Neurologic:  alert & oriented X3  and cranial nerves II-XII intact.   Psych:  normally interactive, good eye contact, not anxious appearing, and not depressed appearing.     Impression & Recommendations:  Problem # 1:  BACTERIAL PNEUMONIA (ICD-482.9) Assessment Improved repeat CXR at follow up in 2 weeks. Her updated medication list for this problem includes:    Avelox 400 Mg Tabs (Moxifloxacin hcl) ..... One by mouth once daily  Problem # 2:  ATRIAL FIBRILLATION (ICD-427.31) Assessment: New patient currently normal sinus rhythm. Questionably triggered by recent pneumonia. She is not a good candidate for coumadin with hx of anemia and falls.  Problem # 3:  ANEMIA, CHRONIC (ICD-281.9)  repeat CBC today.  Recent drop probably at least in part dilutional. Her updated medication list for this problem includes:    Cyanocobalamin 1000 Mcg/ml Soln (Cyanocobalamin) ..... Inject one ml im monthly  Orders: Venipuncture (60454) Specimen Handling (09811) TLB-CBC Platelet - w/Differential (85025-CBCD)  Problem # 4:  OSTEOPOROSIS NOS (ICD-733.00) long discussion of possible therapies. She will consider Prolia.  She has refused Reclast in the past and intolerant of bisphosphonates. Her updated medication list for this problem includes:    Miacalcin 200 Unit/act Soln (Calcitonin (salmon)) ..... Use one spray alternate nostrils daily  Problem # 5:  FRACTURE, RIB (ICD-807.00) pt tolerating well and taking deep breaths without difficulty.  Complete Medication List: 1)  Cyanocobalamin 1000 Mcg/ml Soln (Cyanocobalamin) .... Inject one ml im monthly 2)  Monoject Syringe 25g X 1" 3 Ml Misc (Syringe/needle (disp)) .... Use monthly as directed 3)  Nexium 40 Mg Cpdr (Esomeprazole magnesium) .... Take 1 tablet by mouth two times a day 4)  Mylanta  .... 2 tablespoons every 4 hours 5)  Pepcid  .... One tablet twice daily with the nexium 6)  Diazepam 2 Mg Tabs (Diazepam) .... One by mouth once daily as needed 7)  Miacalcin 200 Unit/act  Soln (Calcitonin (salmon)) .... Use one spray alternate nostrils daily 8)  Tramadol Hcl 50 Mg Tabs (Tramadol hcl) .Marland Kitchen.. 1-2 by mouth q 6 hours as needed pain 9)  Avelox 400 Mg Tabs (Moxifloxacin hcl) .... One by mouth once daily  Patient Instructions: 1)  Please schedule a follow-up appointment in 2 weeks.  2)  Consider Prolia for osteoporosis therapy   Orders Added: 1)  Est. Patient Level IV [91478] 2)  Venipuncture [29562] 3)  Specimen Handling [99000] 4)  TLB-CBC Platelet - w/Differential [85025-CBCD]

## 2010-08-31 NOTE — Assessment & Plan Note (Signed)
Summary: UTI? // RS   Vital Signs:  Patient profile:   77 year old female Menstrual status:  postmenopausal Temp:     98.4 degrees F oral BP sitting:   140 / 88  (left arm) Cuff size:   regular  Vitals Entered By: Sid Falcon LPN (October 08, 2009 3:32 PM) CC: UTI symptoms X 6 days   History of Present Illness: Acute visit.  Recurrent UTI being w/u by urology.   2 day history burning with urination and frequency. History of frequent UTIs in the past. Multiple drug intolerances. Can take Keflex. Nausea with Cipro. Allergy to sulfa. Denies any nausea, vomiting, or diarrhea. No back pain. Schedule ultrasound of kidneys for Monday with urologist.  Allergies: 1)  ! Demerol 2)  ! Motrin 3)  ! Darvocet 4)  ! Morphine 5)  ! Darvon 6)  ! Caffeine 7)  ! Bactrim 8)  ! Penicillin V Potassium (Penicillin V Potassium)  Past History:  Past Medical History: Last updated: 05/25/2009 GERD / Esophageal strictures Allergic rhinitis Arthritis Diverticulosis Hypertension UTI osteoporosis Lumbar stenosis Chronic normocytic anemia  Physical Exam  General:  Well-developed,well-nourished,in no acute distress; alert,appropriate and cooperative throughout examination Mouth:  Oral mucosa and oropharynx without lesions or exudates.  Teeth in good repair. Lungs:  Normal respiratory effort, chest expands symmetrically. Lungs are clear to auscultation, no crackles or wheezes. Heart:  normal rate and regular rhythm.   Msk:  no CVA tenderness   Impression & Recommendations:  Problem # 1:  URINARY TRACT INFECTION, RECURRENT (ICD-599.0) repeat urine cx and start Keflex. Orders: UA Dipstick w/o Micro (manual) (09811) T-Culture, Urine ([id number])  Her updated medication list for this problem includes:    Cephalexin 500 Mg Caps (Cephalexin) ..... One by mouth three times a day for 7 days  Patient Instructions: 1)  Drink plenty of fluids up to 3-4 quarts a day. Cranberry juice is especially  recommended in addition to large amounts of water. Avoid caffeine & carbonated drinks, they tend to irritate the bladder, Return in 3-5 days if you're not better: sooner if you're feeling worse.  Prescriptions: CEPHALEXIN 500 MG CAPS (CEPHALEXIN) one by mouth three times a day for 7 days  #21 x 0   Entered and Authorized by:   Evelena Peat MD   Signed by:   Evelena Peat MD on 10/08/2009   Method used:   Electronically to        [facility] (retail)       [address]       [place], [place]  [address]       Ph: [phone]       Fax: [phone]   RxID:   [id number]   Laboratory Results   Urine Tests    Routine Urinalysis   Color: yellow Appearance: Clear Glucose: negative   (Normal Range: Negative) Bilirubin: negative   (Normal Range: Negative) Ketone: negative   (Normal Range: Negative) Spec. Gravity: 1.020   (Normal Range: 1.003-1.035) Blood: negative   (Normal Range: Negative) pH: 5.0   (Normal Range: 5.0-8.0) Protein: trace   (Normal Range: Negative) Urobilinogen: 0.2   (Normal Range: 0-1) Nitrite: positive   (Normal Range: Negative) Leukocyte Esterace: small   (Normal Range: Negative)    Comments: Sid Falcon LPN  October 08, 2009 4:20 PM

## 2010-08-31 NOTE — Letter (Signed)
Summary: [facility]  [facility]   Imported By: Maryln Gottron 10/16/2009 14:07:13  _____________________________________________________________________  External Attachment:    Type:   Image     Comment:   External Document

## 2010-08-31 NOTE — Assessment & Plan Note (Signed)
Summary: abd pain/MM   Visit Type:  Follow-up Visit Primary Care Provider:  Evelena Peat  Chief Complaint:  abdominal pain.  History of Present Illness: Patient is here for work-in f/u.  Was seen  5 days ago by Tana Coast, PAC and continues to have severe abd pain.  Recent EGD during hospitalization for atypical chest pain, r/o for M, CTA chest was negative for PE. She had tiny nonspecific upper lobe lung nodules, compression of T5 (age indeterminate).  Six ED visits in 8/11. Two since her D/C on 03/20/10. Normal LFTs. Lipase normal. WBC normal. Hgb runs in 9 range. Cardiac markers negative multiple times.   EGD 03/19/10, Dr. Rourk-->. Noncritical-appearing Schatzki's ring otherwise unremarkable esophagus status post passage of a 56-French Maloney dilator. Small hiatal hernia. Antral erythema with focal area of adenomatous-appearing mucosa of uncertain significance status post biopsy (bx benign).   Prior w/u in [place] includes several EGD/ED by Dr. Sheryn Bison. EM in 2006, low LES pressure but no evidence of achalasia. Ambulatory pH probe study showed high DeMeester score but unclear if was done on acid suppression.    She has failed Protonix, Prevacid and Prilosec in the past. Continues to c/o 10/10 epigastric/LUQ pain that radiates to entire abd.  Denies GU symptoms, denies significant back pain.  Hx significant degenerative back changes on past MRI.  No rash. c.o nausea, no vomiting.  Wt stable at present.  Current Problems (verified): 1)  B12 Deficiency  (ICD-266.2) 2)  Weight Loss  (ICD-783.21) 3)  Edema Leg  (ICD-782.3) 4)  Viral Uri  (ICD-465.9) 5)  Urinary Tract Infection, Recurrent  (ICD-599.0) 6)  Otogenic Pain  (ICD-388.71) 7)  Dysphagia  (ICD-787.29) 8)  Reflux Esophagitis  (ICD-530.11) 9)  Epigastric Pain, Chronic  (ICD-789.06) 10)  Inflamed Seborrheic Keratosis  (ICD-702.11) 11)  Fatigue  (ICD-780.79) 12)  Dysuria  (ICD-788.1) 13)  Back Pain, Lumbar  (ICD-724.2) 14)   Hypertension  (ICD-401.9) 15)  Anemia, Chronic  (ICD-281.9) 16)  Diverticulosis, Colon  (ICD-562.10) 17)  Hx of Esophageal Stricture  (ICD-530.3) 18)  Hiatal Hernia  (ICD-553.3) 19)  Depression, Situational  (ICD-309.0) 20)  Hematuria  (ICD-599.7) 21)  Follow-up, High Risk Treatment Nec  (ICD-V67.51) 22)  Osteoporosis Nos  (ICD-733.00) 23)  Constipation  (ICD-564.00) 24)  Bereavement, Uncomplicated  (ICD-V62.82) 25)  Allergic Rhinitis  (ICD-477.9) 26)  Overactive Bladder  (ICD-596.51) 27)  Anemia, Normocytic, Chronic  (ICD-285.9) 28)  Gerd  (ICD-530.81)  Current Medications (verified): 1)  Cyanocobalamin 1000 Mcg/ml Soln (Cyanocobalamin) .... Inject One Ml Im Monthly 2)  Monoject Syringe 25g X 1" 3 Ml Misc (Syringe/needle (Disp)) .... Use Monthly As Directed 3)  Sertraline Hcl 50 Mg Tabs (Sertraline Hcl) .... One Fourth Tablet Daily 4)  Nexium 40 Mg Cpdr (Esomeprazole Magnesium) .... Take 1 Tablet By Mouth Two Times A Day 5)  Mylanta .... 2 Tablespoons Every 4 Hours 6)  Pepcid .... One Tablet Twice Daily With The Nexium  Allergies (verified): 1)  ! Demerol 2)  ! Motrin 3)  ! Darvocet 4)  ! Morphine 5)  ! Darvon 6)  ! Caffeine 7)  ! Bactrim 8)  ! Penicillin V Potassium (Penicillin V Potassium)  Past History:  Past Medical History: Last updated: 04/07/2010 GERD / Esophageal strictures Allergic rhinitis Arthritis Diverticulosis Hypertension UTI, recurrent osteoporosis Lumbar stenosis Chronic normocytic anemia EGD/ED, 03/19/10--> Noncritical-appearing Schatzki's ring otherwise unremarkable esophagus status post passage of a 56-French Maloney dilator. Small hiatal hernia. Antral erythema with focal areaof adenomatous-appearing mucosa of uncertain  significance status post biopsy. Path negative. B12 def     Past Surgical History: Last updated: 10/21/2008 Appendectomy 1960 Cholecystectomy 1999 Hysterectomy 1969 Tonsillectomy 1954  Review of Systems      See  HPI General:  Complains of fatigue and malaise; denies fever, chills, sweats, anorexia, weakness, weight loss, and sleep disorder. CV:  Denies chest pains, angina, palpitations, syncope, dyspnea on exertion, orthopnea, PND, peripheral edema, and claudication. Resp:  Denies dyspnea at rest, dyspnea with exercise, cough, sputum, wheezing, coughing up blood, and pleurisy. GI:  See HPI; Denies jaundice. GU:  Denies urinary burning, blood in urine, nocturnal urination, urinary frequency, and urinary incontinence. MS:  Complains of joint pain / LOM; denies joint swelling, joint stiffness, joint deformity, muscle weakness, muscle cramps, muscle atrophy, leg pain at night, leg pain with exertion, and shoulder pain / LOM hand / wrist pain (CTS); occasional. Derm:  Denies rash, itching, dry skin, hives, moles, warts, and unhealing ulcers. Psych:  Denies depression, anxiety, memory loss, suicidal ideation, hallucinations, paranoia, phobia, and confusion. Heme:  Denies bruising, bleeding, and enlarged lymph nodes.  Vital Signs:  Patient profile:   77 year old female Menstrual status:  postmenopausal Height:      62 inches Weight:      136 pounds BMI:     24.96 Temp:     98.0 degrees F oral Pulse rate:   72 / minute BP sitting:   130 / 80  (left arm) Cuff size:   regular  Vitals Entered By: Cloria Spring LPN (April 12, 2010 2:58 PM)  Physical Exam  General:  Elderly, Well developed, well nourished, no acute distress. Head:  Normocephalic and atraumatic. Eyes:  sclera nonicteric Mouth:  No deformity or lesions, dentition normal. Neck:  No deformities, masses, or tenderness noted. Lungs:  Clear throughout to auscultation. Heart:  Regular rate and rhythm; no murmurs, rubs,  or bruits. Abdomen:  Positive bowel sounds x4, no bruits.  Mod tenderness to entire abd, severe tenderness to epigastrum and Bilat upper quads.  thin, without guarding, and with rebound.  Neg Carnett's.  No CVAT. Msk:   patient has kyphotic posture. No specific point tenderness. Pulses:  Normal pulses noted. Extremities:  No clubbing, cyanosis, edema or deformities noted. Neurologic:  Alert and  oriented x4;  grossly normal neurologically. Skin:  Intact without significant lesions or rashes. Cervical Nodes:  No significant cervical adenopathy. Psych:  Alert and cooperative. Normal mood and affect.  Impression & Recommendations:  Problem # 1:  ABDOMINAL PAIN (ICD-789.00) Mostleyepigastric, bilat upper quads, but radiates to entire abd.  No discrete back pain.  ? referred back pain, radiculopathy vs. AAA, ischemia, less likely volvulus, pancreatitis, SOD, diverticulitis. Orders: Est. Patient Level III ([address])  Problem # 2:  ANEMIA, CHRONIC (ICD-281.9)  Problem # 3:  GERD (ICD-530.81) See#1  Problem # 4:  EPIGASTRIC PAIN, CHRONIC (ICD-789.06) Gastritis on EGD. Multiple PPIs.  Little improvement.  See #1.    Patient Instructions: 1)  CT abd/pelvis w/ IV/oral contrast ASAP

## 2010-08-31 NOTE — Letter (Signed)
Summary: CONSULTATION  CONSULTATION   Imported By: Rexene Alberts 03/23/2010 11:57:17  _____________________________________________________________________  External Attachment:    Type:   Image     Comment:   External Document

## 2010-08-31 NOTE — Assessment & Plan Note (Signed)
Summary: shoulder pain/dm   Vital Signs:  Patient profile:   77 year old female Menstrual status:  postmenopausal Temp:     98.7 degrees F oral BP sitting:   140 / 78  (left arm) Cuff size:   large  Vitals Entered By: Sid Falcon LPN (September 11, 2009 9:47 AM) CC: bil shoulder/neck pain X 2 weeks, sinus congestion     Menstrual Status postmenopausal Last PAP Result Partial Hx   History of Present Illness: Patient seen for the following items.  First issue is that she's had onset yesterday bodyaches and sore throat. Clear postnasal drainage. No fever. No cough. Generalized malaise. Mild bilateral ear pressure.  Second issue is that she's had about 2 weeks of some upper back and neck pains radiating toward both shoulders. No chest pain or shortness of breath. Movement exacerbates. Tylenol and heat help. No radiculopathy symptoms. No weakness.  Allergies: 1)  ! Demerol 2)  ! Motrin 3)  ! Darvocet 4)  ! Morphine 5)  ! Darvon 6)  ! Caffeine 7)  ! Bactrim 8)  ! Penicillin V Potassium (Penicillin V Potassium)  Past History:  Past Medical History: Last updated: 05/25/2009 GERD / Esophageal strictures Allergic rhinitis Arthritis Diverticulosis Hypertension UTI osteoporosis Lumbar stenosis Chronic normocytic anemia  Past Surgical History: Last updated: 10/21/2008 Appendectomy 1960 Cholecystectomy 1999 Hysterectomy 1969 Tonsillectomy 1954 PMH reviewed for relevance  Review of Systems      See HPI  Physical Exam  General:  Well-developed,well-nourished,in no acute distress; alert,appropriate and cooperative throughout examination Eyes:  No corneal or conjunctival inflammation noted. EOMI. Perrla. Funduscopic exam benign, without hemorrhages, exudates or papilledema. Vision grossly normal. Ears:  External ear exam shows no significant lesions or deformities.  Otoscopic examination reveals clear canals, tympanic membranes are intact bilaterally without bulging,  retraction, inflammation or discharge. Hearing is grossly normal bilaterally. Nose:  External nasal examination shows no deformity or inflammation. Nasal mucosa are pink and moist without lesions or exudates. Mouth:  Oral mucosa and oropharynx without lesions or exudates.  Teeth in good repair. Neck:  No deformities, masses, or tenderness noted. Lungs:  Normal respiratory effort, chest expands symmetrically. Lungs are clear to auscultation, no crackles or wheezes. Heart:  normal rate, regular rhythm, and no gallop.   Msk:  she has some nonspecific tenderness trapezius muscles bilaterally. No spinal tenderness. Neurologic:  full strength upper extremities Skin:  Intact without suspicious lesions or rashes   Impression & Recommendations:  Problem # 1:  VIRAL URI (ICD-465.9) use Astelin as needed for nasal congestive symptoms. Continue Tylenol and plenty of fluids.  Problem # 2:  BACK PAIN, UPPER (ICD-724.5) patient intolerant of many medications including muscle relaxers. Continue Tylenol and moist heat.  Consider PT if no better in couple of weeks.  Complete Medication List: 1)  Caltrate 600+d 600-400 Mg-unit Tabs (Calcium carbonate-vitamin d) .... Two times a day 2)  Vit C 500mg   .... One daily 3)  Ferrous Sulfate 325 (65 Fe) Mg Tbec (Ferrous sulfate) .... One daily 4)  Nexium 40 Mg Cpdr (Esomeprazole magnesium) .... One tab two times a day 5)  Multi-day Plus Iron Tabs (Multiple vitamins-iron) .... Once daily 6)  Vitamin D 2000 Unit Tabs (Cholecalciferol) .... Once daily 7)  Amlodipine Besylate 5 Mg Tabs (Amlodipine besylate) .... One by mouth once daily 8)  Cyanocobalamin 1000 Mcg/ml Soln (Cyanocobalamin) .... Inject one ml im monthly 9)  Calcitonin (salmon) 200 Unit/act Soln (Calcitonin (salmon)) .... One spray to alternate nostril daily 10)  Astelin 137 Mcg/spray Soln (Azelastine hcl) .Marland Kitchen.. 1-2 sprays per nostril two times a day as needed  Patient Instructions: 1)  Get plenty of  rest, drink lots of clear liquids, and use Tylenol or Ibuprofen for fever and comfort. Return in 7-10 days if you're not better: sooner if you'er feeling worse.  Prescriptions: ASTELIN 137 MCG/SPRAY SOLN (AZELASTINE HCL) 1-2 sprays per nostril two times a day as needed  #1 x 3   Entered and Authorized by:   Evelena Peat MD   Signed by:   Evelena Peat MD on 09/11/2009   Method used:   Print then Give to Patient   RxID:   [id number]

## 2010-08-31 NOTE — Assessment & Plan Note (Signed)
Summary: POST HOSP F/U (GI PROBLEMS) - B12 INJ // RS   Vital Signs:  Patient profile:   77 year old female Menstrual status:  postmenopausal Weight:      141 pounds Temp:     97.8 degrees F oral BP sitting:   130 / 80  (left arm) Cuff size:   regular  Vitals Entered By: Sid Falcon LPN (April 01, 2010 1:43 PM)  History of Present Illness: Patient seen for post hospital followup.  She's had significant problems recently with recurrent midepigastric pain radiating bilaterally. She was admitted to [facility] 8-17 thru 03-20-2010 with chest pain and ruled out for acute coronary syndrome. Patient negative enzymes. Underwent EGD significant for Schatzki ring which was dilated. Patient was switched to AcipHex but continues to have some discomfort with several types of foods. She had antral erythema and biopsies results unknown at this time. She has gone back to Nexium with some improvement. She has been intolerant of Carafate in the past. She supplements with Pepcid and Maalox with some mild temporary relief. No evidence for recent peptic ulcer disease on exam.  Patient had mild 5 pound weight loss recently. She is dealing with the stress of losing 4 brothers within the past year. Just some difficulties with sleep and decreased mood frequently. Some loss of interest in activities. No suicidal ideation. Feels anxious frequently.  Both pt and daughter feel stress may be playing signif role in her recurrent GI symptoms.  Pt went to ER again last night with similar sxs as above.  Improved with GI "cocktail".  Allergies: 1)  ! Demerol 2)  ! Motrin 3)  ! Darvocet 4)  ! Morphine 5)  ! Darvon 6)  ! Caffeine 7)  ! Bactrim 8)  ! Penicillin V Potassium (Penicillin V Potassium)  Past History:  Past Medical History: Last updated: 11/12/2009 GERD / Esophageal strictures Allergic rhinitis Arthritis Diverticulosis Hypertension UTI, recurrent osteoporosis Lumbar stenosis Chronic  normocytic anemia  Past Surgical History: Last updated: 10/21/2008 Appendectomy 1960 Cholecystectomy 1999 Hysterectomy 1969 Tonsillectomy 1954  Family History: Last updated: 05-30-2009 Father: deceased 60 Leukemia Mother: deceased 65 Staph Infection after hip replacement; HTN 4 brothers: DM, COPD, reflux, Multiple Myeloma 1 child: healthy Family History of Prostate Cancer:Brothers x2  Social History: Last updated: 02/05/2007 Divorced Former Smoker Alcohol use-no Drug use-no Retired: [employer] employee  Risk Factors: Alcohol Use: 0 (03/10/2010) Caffeine Use: 0 (03/10/2010) Exercise: yes (03/10/2010)  Risk Factors: Smoking Status: never (03/10/2010) PMH-FH-SH reviewed for relevance  Review of Systems       The patient complains of anorexia, weight loss, and severe indigestion/heartburn.  The patient denies fever, chest pain, syncope, dyspnea on exertion, peripheral edema, prolonged cough, headaches, hemoptysis, melena, hematochezia, and incontinence.    Physical Exam  General:  Well-developed,well-nourished,in no acute distress; alert,appropriate and cooperative throughout examination Mouth:  Oral mucosa and oropharynx without lesions or exudates.  Teeth in good repair. Neck:  No deformities, masses, or tenderness noted. Lungs:  Normal respiratory effort, chest expands symmetrically. Lungs are clear to auscultation, no crackles or wheezes. Heart:  normal rate and regular rhythm.   Abdomen:  patient has some mild tenderness mid epigastric region. No hepatomegaly or splenomegaly. No guarding or rebound. Extremities:  no edema. Psych:  normally interactive, good eye contact, not anxious appearing, and not depressed appearing.     Impression & Recommendations:  Problem # 1:  REFLUX ESOPHAGITIS (ICD-530.11) cont Nexium with samples given.    Problem # 2:  DEPRESSION, SITUATIONAL (ICD-309.0) ZUNG questionnaire administered and she scores 54 which places her in mild  depression range. Start sertraline 25 mg daily and reassess in 2-3 weeks  Problem # 3:  WEIGHT LOSS (ICD-783.21) ? sec to # 2. Orders: T- * Misc. Laboratory test [phone]) Specimen Handling ([address])  Problem # 4:  ABDOMINAL PAIN, EPIGASTRIC (ICD-789.06) pt and daughter have questions about possible celiac disease.  We explained ways this can present. Doubt but will check tissue transglutaminase antibody to assess.  Problem # 5:  B12 DEFICIENCY (ICD-266.2) daughter instructed in how to administer at home.  Complete Medication List: 1)  Caltrate 600+d 600-400 Mg-unit Tabs (Calcium carbonate-vitamin d) .... Two times a day 2)  Vit C 500mg   .... One daily 3)  Ferrous Sulfate 325 (65 Fe) Mg Tbec (Ferrous sulfate) .... One daily 4)  Multi-day Plus Iron Tabs (Multiple vitamins-iron) .... Once daily 5)  Vitamin D 2000 Unit Tabs (Cholecalciferol) .... Once daily 6)  Amlodipine Besylate 5 Mg Tabs (Amlodipine besylate) .... One by mouth once daily 7)  Cyanocobalamin 1000 Mcg/ml Soln (Cyanocobalamin) .... Inject one ml im monthly 8)  Calcitonin (salmon) 200 Unit/act Soln (Calcitonin (salmon)) .... One spray to alternate nostril daily 9)  Astelin 137 Mcg/spray Soln (Azelastine hcl) .Marland Kitchen.. 1-2 sprays per nostril two times a day as needed 10)  Polyethylene Glycol 3350 Powd (Polyethylene glycol 3350) .... One capful daily as directed 11)  Furosemide 20 Mg Tabs (Furosemide) .... One by mouth once daily 12)  Pantoprazole Sodium 40 Mg Tbec (Pantoprazole sodium) .... One by mouth daily 13)  Monoject Syringe 25g X 1" 3 Ml Misc (Syringe/needle (disp)) .... Use monthly as directed 14)  Sertraline Hcl 50 Mg Tabs (Sertraline hcl) .... One half tablet daily  Other Orders: Vit B12 1000 mcg (J3420) Admin of Therapeutic Inj  intramuscular or subcutaneous (09811) Venipuncture (91478)  Patient Instructions: 1)  schedule followup in 2-3 weeks to reassess Prescriptions: SERTRALINE HCL 50 MG TABS (SERTRALINE HCL)  one half tablet daily  #30 x 3   Entered and Authorized by:   Evelena Peat MD   Signed by:   Evelena Peat MD on 04/01/2010   Method used:   Electronically to        [facility] (retail)       [address]       [place], [place]  [address]       Ph: [phone]       Fax: [phone]   RxID:   [id number] CYANOCOBALAMIN 1000 MCG/ML SOLN (CYANOCOBALAMIN) inject one ml IM monthly  #71ml x 1   Entered by:   Sid Falcon LPN   Authorized by:   Evelena Peat MD   Signed by:   Sid Falcon LPN on 34/74/2595   Method used:   Electronically to        [facility] (retail)       [address]       [place], [place]  [address]       Ph: [phone]       Fax: [phone]   RxID:   [id number] MONOJECT SYRINGE 25G X 1" 3 ML MISC (SYRINGE/NEEDLE (DISP)) use monthly as directed  #25 x 0   Entered by:   Sid Falcon LPN   Authorized by:   Evelena Peat MD   Signed by:   Sid Falcon LPN on  04/01/2010   Method used:   Electronically to        [facility] (retail)       [address]       [place], [place]  [address]       Ph: [phone]       Fax: [phone]   RxID:   [id number]    Medication Administration  Injection # 1:    Medication: Vit B12 1000 mcg    Diagnosis: ANEMIA, CHRONIC (ICD-281.9)    Route: IM    Site: L deltoid    Exp Date: 10/31/2011    Lot #: [id number]    Mfr: [employer]    Patient tolerated injection without complications    Given by: Sid Falcon LPN (April 01, 2010 1:57 PM)  Orders Added: 1)  Vit B12 1000 mcg [J3420] 2)  Admin of Therapeutic Inj  intramuscular or subcutaneous [96372] 3)  Est. Patient Level IV [01027] 4)  T- * Misc. Laboratory test [99999] 5)  Venipuncture [36415] 6)  Specimen Handling [99000]

## 2010-08-31 NOTE — Miscellaneous (Signed)
Summary: Certification and Plan of Care/[facility]  Certification and Plan of Care/[facility]   Imported By: Maryln Gottron 06/17/2010 11:15:42  _____________________________________________________________________  External Attachment:    Type:   Image     Comment:   External Document

## 2010-08-31 NOTE — Letter (Signed)
Summary: [facility]  [facility]   Imported By: Maryln Gottron 08/06/2009 13:49:12  _____________________________________________________________________  External Attachment:    Type:   Image     Comment:   External Document

## 2010-08-31 NOTE — Assessment & Plan Note (Signed)
Summary: b12 inj/njr  Nurse Visit   Allergies: 1)  ! Demerol 2)  ! Motrin 3)  ! Darvocet 4)  ! Morphine 5)  ! Darvon 6)  ! Caffeine 7)  ! Bactrim 8)  ! Penicillin V Potassium (Penicillin V Potassium)  Medication Administration  Injection # 1:    Medication: Vit B12 1000 mcg    Diagnosis: ANEMIA, CHRONIC (ICD-281.9)    Route: IM    Site: L deltoid    Exp Date: 03/01/2010    Lot #: [id number]    Mfr: [employer] Regent    Patient tolerated injection without complications    Given by: Sid Falcon LPN (August 04, 2009 12:27 PM)  Orders Added: 1)  Vit B12 1000 mcg [J3420] 2)  Admin of Therapeutic Inj  intramuscular or subcutaneous [96372] Prescriptions: CALCITONIN (SALMON) 200 UNIT/ACT SOLN (CALCITONIN (SALMON)) one spray to each nostril daily  #3.7 ml x 3   Entered by:   Sid Falcon LPN   Authorized by:   Evelena Peat MD   Signed by:   Sid Falcon LPN on 96/10/5407   Method used:   Electronically to        [facility]   [address] (retail)       [address]       [place], [place]  [address]       Ph: [phone]       Fax: [phone]   RxID:   [id number]

## 2010-08-31 NOTE — Progress Notes (Signed)
Summary: Pt req to change from Nexium to Protonix  Phone Note Call from Patient Call back at [facility] Phone [phone]   Caller: Patient Summary of Call: Pt called is req to get the Nexium changed to Protonix. Pt says that Nexium is too strong. Pls advise.  Initial call taken by: Lucy Antigua,  March 24, 2010 11:33 AM  Follow-up for Phone Call        She was just seen by gastroenterologist (Dr Benard Rink) who placed her on Aciphex. We should have her follow his recommendations at this time for PPI therapy. Follow-up by: Evelena Peat MD,  March 24, 2010 1:03 PM  Additional Follow-up for Phone Call Additional follow up Details #1::        I called pt back and informed them that she should follow Dr. Benard Rink recommendations for PPI therapy at this time, as noted above. Pt said that they will contact Dr. Benard Rink. Additional Follow-up by: Lucy Antigua,  March 24, 2010 1:10 PM

## 2010-08-31 NOTE — Assessment & Plan Note (Signed)
Summary: 1 month rov/njr   Vital Signs:  Patient profile:   77 year old female Menstrual status:  postmenopausal Weight:      135 pounds Temp:     97.5 degrees F oral BP sitting:   150 / 80  (left arm) Cuff size:   regular  Vitals Entered By: Sid Falcon LPN (May 12, 2010 10:54 AM)  History of Present Illness: Patient here to discuss the following issues.  Acute new problem of back pain which is really more her left posterior axillary line mid thorax. Onset approximately 1 week ago. Pain off and on. Moderate severity.  Burning quality. No rash. Worse with movement. Tylenol without much relief. Denies any cough, hemoptysis, or dyspnea. No fever.  Increased constipation. Drinks plenty of fluids. Fiber intake Limited. She's had some gastritis issues and fearfull of eating many foods. Avoid citric foods. Not using any stool softener.  History osteoporosis. Intolerant of bisphosphonates. Has refused medication such as Reclast or Prolia. She does remain on Miacalcin..  Not taking Vit D or calcium consistently.  Vit D 32 5/11.  Gerd on nexium and taking regularly.  Allergies: 1)  ! Demerol 2)  ! Motrin 3)  ! Darvocet 4)  ! Morphine 5)  ! Darvon 6)  ! Caffeine 7)  ! Bactrim 8)  ! Penicillin V Potassium (Penicillin V Potassium)  Past History:  Past Medical History: Last updated: Apr 21, 2010 GERD / Esophageal strictures Allergic rhinitis Arthritis Diverticulosis Hypertension UTI, recurrent osteoporosis Lumbar stenosis Chronic normocytic anemia EGD/ED, 03/19/10--> Noncritical-appearing Schatzki's ring otherwise unremarkable esophagus status post passage of a 56-French Maloney dilator. Small hiatal hernia. Antral erythema with focal areaof adenomatous-appearing mucosa of uncertain significance status post biopsy. Path negative. B12 def     Past Surgical History: Last updated: 10/21/2008 Appendectomy 1960 Cholecystectomy 1999 Hysterectomy 1969 Tonsillectomy  1954  Family History: Last updated: 04/21/2010 Father: deceased 97 Leukemia Mother: deceased 21 Staph Infection after hip replacement; HTN 4 brothers: DM, COPD, reflux, Multiple Myeloma 1 child: healthy Family History of Prostate Cancer:Brothers x2 No FH of CRC.  Social History: Last updated: 02/05/2007 Divorced Former Smoker Alcohol use-no Drug use-no Retired: Corporate treasurer  Risk Factors: Alcohol Use: 0 (03/10/2010) Caffeine Use: 0 (03/10/2010) Exercise: yes (03/10/2010)  Risk Factors: Smoking Status: never (03/10/2010) PMH-FH-SH reviewed for relevance  Review of Systems  The patient denies anorexia, fever, weight loss, hoarseness, chest pain, syncope, dyspnea on exertion, peripheral edema, prolonged cough, headaches, hemoptysis, abdominal pain, melena, hematochezia, and incontinence.    Physical Exam  General:  Well-developed,well-nourished,in no acute distress; alert,appropriate and cooperative throughout examination Mouth:  Oral mucosa and oropharynx without lesions or exudates.  Teeth in good repair. Neck:  No deformities, masses, or tenderness noted. Lungs:  Normal respiratory effort, chest expands symmetrically. Lungs are clear to auscultation, no crackles or wheezes. Heart:  normal rate and regular rhythm.   Abdomen:  Bowel sounds positive,abdomen soft and non-tender without masses, organomegaly or hernias noted. Msk:  patient has some poorly localized tenderness left thoracic posterior axillary line area on the left. No visible rashes. Extremities:  No clubbing, cyanosis, edema, or deformity noted with normal full range of motion of all joints.     Impression & Recommendations:  Problem # 1:  OSTEOPOROSIS NOS (ICD-733.00)  refill Miacalcin. Discussed multiple treatment options and she is reluctant to consider other therapies. Needs better calcium intake  Her updated medication list for this problem includes:    Miacalcin 200 Unit/act Soln (Calcitonin  (salmon)) ..... Use one  spray alternate nostrils daily  Problem # 2:  GERD (ICD-530.81)  Her updated medication list for this problem includes:    Nexium 40 Mg Cpdr (Esomeprazole magnesium) .Marland Kitchen... Take 1 tablet by mouth two times a day  Problem # 3:  BACK PAIN, THORACIC REGION (ICD-724.1) Assessment: New  suspect musculoskeletal. Trial of tramadol. Watch for any rash suggestive of shingles  Her updated medication list for this problem includes:    Tramadol Hcl 50 Mg Tabs (Tramadol hcl) .Marland Kitchen... 1-2 by mouth q 6 hours as needed pain  Orders: Prescription Created Electronically [phone])  Problem # 4:  CONSTIPATION (ICD-564.00) Assessment: New patient needs to increase her fiber intake. She'll try over-the-counter stool softener  Complete Medication List: 1)  Cyanocobalamin 1000 Mcg/ml Soln (Cyanocobalamin) .... Inject one ml im monthly 2)  Monoject Syringe 25g X 1" 3 Ml Misc (Syringe/needle (disp)) .... Use monthly as directed 3)  Nexium 40 Mg Cpdr (Esomeprazole magnesium) .... Take 1 tablet by mouth two times a day 4)  Mylanta  .... 2 tablespoons every 4 hours 5)  Pepcid  .... One tablet twice daily with the nexium 6)  Diazepam 2 Mg Tabs (Diazepam) .... One by mouth once daily as needed 7)  Miacalcin 200 Unit/act Soln (Calcitonin (salmon)) .... Use one spray alternate nostrils daily 8)  Tramadol Hcl 50 Mg Tabs (Tramadol hcl) .Marland Kitchen.. 1-2 by mouth q 6 hours as needed pain  Patient Instructions: 1)  Consider stool softener such as colace or senokot S. 2)  Consider use of Prunes. 3)  Tums 500mg  two daily. 4)  Eat more high fiber foods such as high fiber cereals and nonacidic fruits. 5)  Please schedule a follow-up appointment in 3 months .  Prescriptions: TRAMADOL HCL 50 MG TABS (TRAMADOL HCL) 1-2 by mouth q 6 hours as needed pain  #60 x 1   Entered and Authorized by:   Evelena Peat MD   Signed by:   Evelena Peat MD on 05/12/2010   Method used:   Electronically to        [facility] (retail)       [address]       [place], [place]  [address]       Ph: [phone]       Fax: [phone]   RxID:   [id number] MIACALCIN 200 UNIT/ACT SOLN (CALCITONIN (SALMON)) use one spray alternate nostrils daily  #1 x 11   Entered and Authorized by:   Evelena Peat MD   Signed by:   Evelena Peat MD on 05/12/2010   Method used:   Electronically to        [facility] [address] (retail)       [address]       [place], [place]  [address]       Ph: [phone]       Fax: [phone]   RxID:   [id number]

## 2010-08-31 NOTE — Assessment & Plan Note (Signed)
Summary: st/chills/njr   Vital Signs:  Patient profile:   77 year old female Menstrual status:  postmenopausal Temp:     99.5 degrees F oral BP sitting:   150 / 80  (left arm) Cuff size:   regular  Vitals Entered By: Sid Falcon LPN (May 26, 2010 2:49 PM)  History of Present Illness: Patient seen for acute febrile illness. She gives history that this past Sunday she went to local emergency room with left rib cage pain.  No reported injury.  Was given Toradol. Rib x-rays negative. Presents now with one to 2 day history of fever, cough productive of yellow sputum. No nasal congestion. Mild sore throat. Denies any dysuria, nausea, vomiting, or diarrhea. No abdominal pain. Mild low back pain. Tylenol and Motrin with some relief of fever.m  Generalized weakness.  No burning with urination but does have hx of freq UTI.  Allergies: 1)  ! Demerol 2)  ! Motrin 3)  ! Darvocet 4)  ! Morphine 5)  ! Darvon 6)  ! Caffeine 7)  ! Bactrim 8)  ! Penicillin V Potassium (Penicillin V Potassium)  Past History:  Past Medical History: Last updated: 20-Apr-2010 GERD / Esophageal strictures Allergic rhinitis Arthritis Diverticulosis Hypertension UTI, recurrent osteoporosis Lumbar stenosis Chronic normocytic anemia EGD/ED, 03/19/10--> Noncritical-appearing Schatzki's ring otherwise unremarkable esophagus status post passage of a 56-French Maloney dilator. Small hiatal hernia. Antral erythema with focal areaof adenomatous-appearing mucosa of uncertain significance status post biopsy. Path negative. B12 def     Past Surgical History: Last updated: 10/21/2008 Appendectomy 1960 Cholecystectomy 1999 Hysterectomy 1969 Tonsillectomy 1954  Family History: Last updated: 04/20/10 Father: deceased 81 Leukemia Mother: deceased 3 Staph Infection after hip replacement; HTN 4 brothers: DM, COPD, reflux, Multiple Myeloma 1 child: healthy Family History of Prostate Cancer:Brothers x2 No FH of  CRC.  Social History: Last updated: 02/05/2007 Divorced Former Smoker Alcohol use-no Drug use-no Retired: Ship broker employee  Risk Factors: Alcohol Use: 0 (03/10/2010) Caffeine Use: 0 (03/10/2010) Exercise: yes (03/10/2010)  Risk Factors: Smoking Status: never (03/10/2010) PMH-FH-SH reviewed for relevance  Review of Systems       The patient complains of anorexia, fever, and muscle weakness.  The patient denies weight loss, chest pain, syncope, dyspnea on exertion, peripheral edema, prolonged cough, headaches, hemoptysis, abdominal pain, melena, hematochezia, severe indigestion/heartburn, and hematuria.    Physical Exam  General:  thin, somewhat frail appearing 77 year old female. Ears:  External ear exam shows no significant lesions or deformities.  Otoscopic examination reveals clear canals, tympanic membranes are intact bilaterally without bulging, retraction, inflammation or discharge. Hearing is grossly normal bilaterally. Mouth:  Oral mucosa and oropharynx without lesions or exudates.  Teeth in good repair. Neck:  No deformities, masses, or tenderness noted. Lungs:  patient has rales left base-clear slightly with deep breathing. Right base clear. No wheezes. No retractions. Heart:  normal rate and regular rhythm.   Abdomen:  soft and non-tender.   Extremities:  no pitting edema. Neurologic:  alert & oriented X3 and cranial nerves II-XII intact.   generalized weakness.  No focal weakness. Skin:  no rashes.   Cervical Nodes:  No lymphadenopathy noted Psych:  normally interactive, good eye contact, not anxious appearing, and not depressed appearing.     Impression & Recommendations:  Problem # 1:  FEVER UNSPECIFIED (ICD-780.60) Assessment New  ? community acquired pneumonia, LLL.  CXR to further assess.  Pt has hx freq UTI but no current dysuria. Sample Avelox provided and start 400mg  by  mouth once daily .  Complete Medication List: 1)  Cyanocobalamin 1000 Mcg/ml Soln  (Cyanocobalamin) .... Inject one ml im monthly 2)  Monoject Syringe 25g X 1" 3 Ml Misc (Syringe/needle (disp)) .... Use monthly as directed 3)  Nexium 40 Mg Cpdr (Esomeprazole magnesium) .... Take 1 tablet by mouth two times a day 4)  Mylanta  .... 2 tablespoons every 4 hours 5)  Pepcid  .... One tablet twice daily with the nexium 6)  Diazepam 2 Mg Tabs (Diazepam) .... One by mouth once daily as needed 7)  Miacalcin 200 Unit/act Soln (Calcitonin (salmon)) .... Use one spray alternate nostrils daily 8)  Tramadol Hcl 50 Mg Tabs (Tramadol hcl) .Marland Kitchen.. 1-2 by mouth q 6 hours as needed pain 9)  Avelox 400 Mg Tabs (Moxifloxacin hcl) .... One by mouth once daily  Other Orders: T-2 View CXR (71020TC)  Patient Instructions: 1)  Avelox 400 mg one daily. 2)  Schedule follow up appt for tomorrow. Prescriptions: AVELOX 400 MG TABS (MOXIFLOXACIN HCL) one by mouth once daily  #10 x 0   Entered and Authorized by:   Evelena Peat MD   Signed by:   Evelena Peat MD on 05/26/2010   Method used:   Electronically to        [facility] [address] (retail)       [address]       [place], [place]  [address]       Ph: [phone]       Fax: [phone]   RxID:   [id number]    Orders Added: 1)  T-2 View CXR [71020TC] 2)  Est. Patient Level IV [40102]

## 2010-08-31 NOTE — Assessment & Plan Note (Signed)
Summary: FATIGUE // RS   Vital Signs:  Patient profile:   77 year old female Menstrual status:  postmenopausal Weight:      134 pounds Temp:     98.4 degrees F oral BP sitting:   130 / 70  (left arm) Cuff size:   regular  Vitals Entered By: Sid Falcon LPN (April 14, 2010 10:54 AM) CC: Fatigue, not feeling well, unsteady on her feet   History of Present Illness: Patient is seen with complaints of fatigue and feeling somewhat unsteady on her feet. She is convinced this is related to dropping hemoglobin. She's not had any indicators of active bleeding.  Recent TSH normal.  Has chronic anemia with hgb around 9 range.  Patient had multiple emergency room visits and has seen gastroenterologist recently with mid epigastric pain. Refer to excellent dictated note of 9 -12-11 for details regarding her workup. She's had recent EGD along with CT abdomen and pelvis which did not show any acute abnormality. She has chronic anemia which is normocytic and several other labs recently have been unremarkable.  Patient has had some recent weight loss which she attributes to her fear of eating foods that may aggravate her " gastritis".  patient takes stool low-dose sertraline estimates this may be causing more fatigue. She feels her depressive symptoms are stable.  New problem of 2 episodes of very transient whitish discoloration left ring and middle finger which lasted only 5 or 10 minutes. This resolved after she put her hand in dependent position and "milked" some blood into her fingers. She's not had a severe finger or hand pain. No prior history of Raynaud's. No new skin rashes. No significant arthralgias  Pt has some chronic anxiety and has taken low dose diazepam in past very sparingly at 2mg  one half tablet daily as needed.  She is requesting refills today.  Allergies: 1)  ! Demerol 2)  ! Motrin 3)  ! Darvocet 4)  ! Morphine 5)  ! Darvon 6)  ! Caffeine 7)  ! Bactrim 8)  ! Penicillin V  Potassium (Penicillin V Potassium)  Past History:  Past Medical History: Last updated: 04-21-10 GERD / Esophageal strictures Allergic rhinitis Arthritis Diverticulosis Hypertension UTI, recurrent osteoporosis Lumbar stenosis Chronic normocytic anemia EGD/ED, 03/19/10--> Noncritical-appearing Schatzki's ring otherwise unremarkable esophagus status post passage of a 56-French Maloney dilator. Small hiatal hernia. Antral erythema with focal areaof adenomatous-appearing mucosa of uncertain significance status post biopsy. Path negative. B12 def     Past Surgical History: Last updated: 10/21/2008 Appendectomy 1960 Cholecystectomy 1999 Hysterectomy 1969 Tonsillectomy 1954  Family History: Last updated: 2010-04-21 Father: deceased 79 Leukemia Mother: deceased 28 Staph Infection after hip replacement; HTN 4 brothers: DM, COPD, reflux, Multiple Myeloma 1 child: healthy Family History of Prostate Cancer:Brothers x2 No FH of CRC.  Social History: Last updated: 02/05/2007 Divorced Former Smoker Alcohol use-no Drug use-no Retired: Ship broker employee  Risk Factors: Alcohol Use: 0 (03/10/2010) Caffeine Use: 0 (03/10/2010) Exercise: yes (03/10/2010)  Risk Factors: Smoking Status: never (03/10/2010) PMH-FH-SH reviewed for relevance  Review of Systems       The patient complains of weight loss.  The patient denies anorexia, fever, hoarseness, chest pain, syncope, dyspnea on exertion, prolonged cough, headaches, hemoptysis, melena, hematochezia, suspicious skin lesions, and depression.    Physical Exam  General:  Well-developed,well-nourished,in no acute distress; alert,appropriate and cooperative throughout examination Mouth:  Oral mucosa and oropharynx without lesions or exudates.  Teeth in good repair. Neck:  No deformities, masses, or tenderness noted.  Lungs:  Normal respiratory effort, chest expands symmetrically. Lungs are clear to auscultation, no crackles or  wheezes. Heart:  normal rate and regular rhythm.   Extremities:  no pitting edema. She has good capillary refill both hands. Both hands are warm to touch. Radial pulses are normal bilaterally Neurologic:  alert & oriented X3 and cranial nerves II-XII intact.     Impression & Recommendations:  Problem # 1:  FATIGUE (ICD-780.79) prob multifactorial..  repeat Hbg.  D/c sertraline.  Problem # 2:  WEIGHT LOSS (ICD-783.21)  Problem # 3:  RAYNAUDS SYNDROME (ICD-443.0) Assessment: New normal circulation today.  Problem # 4:  ANEMIA, CHRONIC (ICD-281.9)  Her updated medication list for this problem includes:    Cyanocobalamin 1000 Mcg/ml Soln (Cyanocobalamin) ..... Inject one ml im monthly  Orders: Venipuncture (13244) Specimen Handling (01027) TLB-CBC Platelet - w/Differential (85025-CBCD)  Problem # 5:  ANXIETY STATE, UNSPECIFIED (ICD-300.00) d/c sertraline.  She knows to use Diazepam very sparingly. The following medications were removed from the medication list:    Sertraline Hcl 50 Mg Tabs (Sertraline hcl) ..... One fourth tablet daily Her updated medication list for this problem includes:    Diazepam 2 Mg Tabs (Diazepam) ..... One by mouth once daily as needed  Complete Medication List: 1)  Cyanocobalamin 1000 Mcg/ml Soln (Cyanocobalamin) .... Inject one ml im monthly 2)  Monoject Syringe 25g X 1" 3 Ml Misc (Syringe/needle (disp)) .... Use monthly as directed 3)  Nexium 40 Mg Cpdr (Esomeprazole magnesium) .... Take 1 tablet by mouth two times a day 4)  Mylanta  .... 2 tablespoons every 4 hours 5)  Pepcid  .... One tablet twice daily with the nexium 6)  Diazepam 2 Mg Tabs (Diazepam) .... One by mouth once daily as needed  Patient Instructions: 1)  Stop sertraline 2)  Please schedule a follow-up appointment in 1 month.  Prescriptions: DIAZEPAM 2 MG TABS (DIAZEPAM) one by mouth once daily as needed  #30 x 1   Entered and Authorized by:   Evelena Peat MD   Signed by:    Evelena Peat MD on 04/14/2010   Method used:   Print then Give to Patient   RxID:   [id number]

## 2010-08-31 NOTE — Progress Notes (Signed)
Summary: call in protonix prescription  Phone Note Call from Patient   Reason for Call: Refill Medication, Talk to Nurse Summary of Call: pt called she said that the Aciphex isnt helping her and wanted to know if RMR could call her in some Protonix instead. She uses [facility] and her number is [phone] Initial call taken by: Diana Eves,  March 25, 2010 9:01 AM     Appended Document: call in protonix prescription    Prescriptions: PANTOPRAZOLE SODIUM 40 MG TBEC (PANTOPRAZOLE SODIUM) one by mouth daily  #30 x 11   Entered and Authorized by:   Leanna Battles. Dixon Boos   Signed by:   Leanna Battles Dixon Boos on 03/25/2010   Method used:   Electronically to        [facility] (retail)       [address]       [place], [place]  [address]       Ph: [phone]       Fax: [phone]   RxID:   [id number]     Appended Document: call in protonix prescription [facility] Rx sent to [facility].

## 2010-08-31 NOTE — Progress Notes (Signed)
Summary: Requesting something for muscle spasms  Phone Note Call from Patient Call back at Home Phone [phone]   Caller: Patient--live call Call For: Evelena Peat MD Summary of Call: having muscle spasms. went to er last night and was given Percocet. Not helping . refused ov for today. wants Harriett Sine to return call. Initial call taken by: Warnell Forester,  June 14, 2010 8:04 AM  Follow-up for Phone Call        Pt sneezed yesterday and experienced severe left side back pain.  She did go to the ER and all X-rays were neg for fx.  Told her she was probably having  muscle spasms.  Percocet is not helping, requesting something for muscle spasms. [facility] Follow-up by: Sid Falcon LPN,  June 14, 2010 9:29 AM  Additional Follow-up for Phone Call Additional follow up Details #1::        try low dose cyclobenzaprine 5 mg one by mouth q8 hours as needed with caution for possible sedation. Disp #20 with no refill. Additional Follow-up by: Evelena Peat MD,  June 14, 2010 1:14 PM    Additional Follow-up for Phone Call Additional follow up Details #2::    Rx called in, pt informed.  If it is too sedating, may try 1/2 tab every 8 hours.  Pt does not plan on driving or leaving the house.   Follow-up by: Sid Falcon LPN,  June 14, 2010 1:30 PM  New/Updated Medications: CYCLOBENZAPRINE HCL 5 MG TABS (CYCLOBENZAPRINE HCL) one tab every 8 hours prn Prescriptions: CYCLOBENZAPRINE HCL 5 MG TABS (CYCLOBENZAPRINE HCL) one tab every 8 hours prn  #20 x 0   Entered by:   Sid Falcon LPN   Authorized by:   Evelena Peat MD   Signed by:   Sid Falcon LPN on 82/95/6213   Method used:   Electronically to        [facility] (retail)       [address]       [place], [place]  [address]       Ph: [phone]       Fax: [phone]   RxID:   [id number]

## 2010-08-31 NOTE — Assessment & Plan Note (Signed)
Summary: 6 month rov/njr/pt coming in fasting/pt rsc/cjr/b-12 inj/cjr   Vital Signs:  Patient profile:   77 year old female Menstrual status:  postmenopausal Height:      62 inches Weight:      145 pounds Temp:     97.8 degrees F oral BP sitting:   160 / 84  (left arm) Cuff size:   regular  Vitals Entered By: Sid Falcon LPN (Dec 03, 1608 8:28 AM) CC: 6 month follow-up, pt fasting, Hypertension Management   History of Present Illness: Patient here for followup multiple medical problems as follows.  Hypertension treated amlodipine 5 mg daily. Did not take medication this morning. Some late day peripheral edema otherwise no side effect. Denies any dizziness, headaches or palpitations. Blood pressure is up and well-controlled.  History normocytic anemia. Vitamin B12 deficiency. Needs injection.  History osteoporosis. Vitamin D deficiency. DEXA Scan 09/2008. Patient takes needed calcium. Intolerant of bisphosphonates. Reluctant to consider other meds treatments. No recent falls or fractures.  History of GERD stable on Nexium 40 mg b.i.d.  Hypertension History:      She complains of peripheral edema, but denies headache, chest pain, palpitations, dyspnea with exertion, orthopnea, PND, neurologic problems, and syncope.        Positive major cardiovascular risk factors include female age 31 years old or older and hypertension.  Negative major cardiovascular risk factors include non-tobacco-user status.     Allergies: 1)  ! Demerol 2)  ! Motrin 3)  ! Darvocet 4)  ! Morphine 5)  ! Darvon 6)  ! Caffeine 7)  ! Bactrim 8)  ! Penicillin V Potassium (Penicillin V Potassium)  Past History:  Past Medical History: Last updated: 11/12/2009 GERD / Esophageal strictures Allergic rhinitis Arthritis Diverticulosis Hypertension UTI, recurrent osteoporosis Lumbar stenosis Chronic normocytic anemia  Past Surgical History: Last updated: 10/21/2008 Appendectomy 1960 Cholecystectomy  1999 Hysterectomy 1969 Tonsillectomy 1954  Social History: Last updated: 02/05/2007 Divorced Former Smoker Alcohol use-no Drug use-no Retired: Corporate treasurer PMH-FH-SH reviewed for relevance  Review of Systems  The patient denies anorexia, fever, weight loss, weight gain, chest pain, syncope, dyspnea on exertion, prolonged cough, headaches, hemoptysis, abdominal pain, melena, hematochezia, severe indigestion/heartburn, hematuria, and incontinence.    Physical Exam  General:  Well-developed,well-nourished,in no acute distress; alert,appropriate and cooperative throughout examination Head:  Normocephalic and atraumatic without obvious abnormalities. No apparent alopecia or balding. Mouth:  Oral mucosa and oropharynx without lesions or exudates.  Teeth in good repair. Neck:  No deformities, masses, or tenderness noted. Lungs:  Normal respiratory effort, chest expands symmetrically. Lungs are clear to auscultation, no crackles or wheezes. Heart:  normal rate and regular rhythm.   Extremities:  has trace pitting edema lower legs bilaterally Neurologic:  alert & oriented X3 and cranial nerves II-XII intact.     Impression & Recommendations:  Problem # 1:  HYPERTENSION (ICD-401.9) suboptimal control today probably related to patient not taking medication this morning. Her updated medication list for this problem includes:    Amlodipine Besylate 5 Mg Tabs (Amlodipine besylate) ..... One by mouth once daily  Orders: TLB-BMP (Basic Metabolic Panel-BMET) (80048-METABOL) TLB-Lipid Panel (80061-LIPID) Venipuncture (96045)  Problem # 2:  OSTEOPOROSIS NOS (ICD-733.00) continue treatments as below. Discussed alternative treatments for osteoporosis such as Reclast or Prolia. Her updated medication list for this problem includes:    Caltrate 600+d 600-400 Mg-unit Tabs (Calcium carbonate-vitamin d) .Marland Kitchen..Marland Kitchen Two times a day    Vitamin D 2000 Unit Tabs (Cholecalciferol) ..... Once daily  Calcitonin (salmon) 200 Unit/act Soln (Calcitonin (salmon)) ..... One spray to alternate nostril daily  Orders: T-Vitamin D (25-Hydroxy) [phone]) Venipuncture (09811)  Problem # 3:  ANEMIA, CHRONIC (ICD-281.9)  Her updated medication list for this problem includes:    Ferrous Sulfate 325 (65 Fe) Mg Tbec (Ferrous sulfate) ..... One daily    Cyanocobalamin 1000 Mcg/ml Soln (Cyanocobalamin) ..... Inject one ml im monthly  Orders: Vit B12 1000 mcg (J3420) Admin of Therapeutic Inj  intramuscular or subcutaneous (91478) Venipuncture (29562)  Problem # 4:  GERD (ICD-530.81)  Her updated medication list for this problem includes:    Nexium 40 Mg Cpdr (Esomeprazole magnesium) ..... One tab two times a day  Complete Medication List: 1)  Caltrate 600+d 600-400 Mg-unit Tabs (Calcium carbonate-vitamin d) .... Two times a day 2)  Vit C 500mg   .... One daily 3)  Ferrous Sulfate 325 (65 Fe) Mg Tbec (Ferrous sulfate) .... One daily 4)  Nexium 40 Mg Cpdr (Esomeprazole magnesium) .... One tab two times a day 5)  Multi-day Plus Iron Tabs (Multiple vitamins-iron) .... Once daily 6)  Vitamin D 2000 Unit Tabs (Cholecalciferol) .... Once daily 7)  Amlodipine Besylate 5 Mg Tabs (Amlodipine besylate) .... One by mouth once daily 8)  Cyanocobalamin 1000 Mcg/ml Soln (Cyanocobalamin) .... Inject one ml im monthly 9)  Calcitonin (salmon) 200 Unit/act Soln (Calcitonin (salmon)) .... One spray to alternate nostril daily 10)  Astelin 137 Mcg/spray Soln (Azelastine hcl) .Marland Kitchen.. 1-2 sprays per nostril two times a day as needed 11)  Polyethylene Glycol 3350 Powd (Polyethylene glycol 3350) .... One capful daily as directed 12)  Cephalexin 500 Mg Caps (Cephalexin) .... One by mouth three times a day for 7 days  Other Orders: TLB-CBC Platelet - w/Differential (85025-CBCD) TLB-B12, Serum-Total ONLY (13086-V78)  Hypertension Assessment/Plan:      The patient's hypertensive risk group is category B: At least one  risk factor (excluding diabetes) with no target organ damage.  Today's blood pressure is 160/84.  Her blood pressure goal is < 140/90.  Patient Instructions: 1)  Continue monthly B12 injections. 2)  Continue calcium and Vit D supplements. 3)  Please schedule a follow-up appointment in 3 months .    Medication Administration  Injection # 1:    Medication: Vit B12 1000 mcg    Diagnosis: ANEMIA, CHRONIC (ICD-281.9)    Route: IM    Site: L deltoid    Exp Date: 04/02/2011    Lot #: [id number]    Mfr: [employer] Regent    Patient tolerated injection without complications    Given by: Sid Falcon LPN (Dec 04, 2950 9:06 AM)  Orders Added: 1)  Est. Patient Level IV [84132] 2)  TLB-CBC Platelet - w/Differential [85025-CBCD] 3)  TLB-B12, Serum-Total ONLY [82607-B12] 4)  T-Vitamin D (25-Hydroxy) [44010-27253] 5)  TLB-BMP (Basic Metabolic Panel-BMET) [80048-METABOL] 6)  TLB-Lipid Panel [80061-LIPID] 7)  Vit B12 1000 mcg [J3420] 8)  Admin of Therapeutic Inj  intramuscular or subcutaneous [96372] 9)  Venipuncture [66440]

## 2010-08-31 NOTE — Progress Notes (Signed)
Summary: FALL AT [facility]ICE  Phone Note From Other Clinic   Summary of Call: Patient parked in the lot at the [place] office and fell into the bushes when walking into the office. She was assisted by other patients into a wheelchair and brought into the front lobby. This is when I first saw the patient.   She had no complaints. Pt says she had been having trouble with weakness in her legs but no problems driving. I assisted patient to xray (reason for visit to [facility] office) and then back to her car. She still reported no complaints from the fall. I saw no obivious cuts or scratches on exposed skin areas. She also advised me that she had a f/u tomorrow w/her PCP and would call their office with any change in symptoms.  Initial call taken by: Lamar Sprinkles, CMA,  May 26, 2010 4:37 PM  Follow-up for Phone Call        I spoke with pt on phone.  Above noted.  She had no difficulty driving but progressive problems with ambulating.  She lives alone and cannot manage if unable to ambulate and thus I have recommended that she be transported to ER for further eval.  May need admission for further eval. Follow-up by: Evelena Peat MD,  May 26, 2010 5:36 PM

## 2010-08-31 NOTE — Assessment & Plan Note (Signed)
Summary: gi issues//ccm   Vital Signs:  Patient profile:   77 year old female Temp:     98.1 degrees F oral BP sitting:   150 / 80  (left arm) Cuff size:   regular  Vitals Entered By: Sid Falcon LPN (August 25, 2009 10:15 AM) CC: Pain upper abd X 1 week   History of Present Illness: Patient seen with one week history of midepigastric burning. Long history of GI issues with hiatal hernia and distal esophageal range and reflux. Takes Nexium 40 mg b.i.d. regularly. Discomfort described as burning and worse after eating. Bland foods aggravate less. Recent EGD in November with dilatation of the esophagus.  no recent exertional chest symptoms. Patient thinks symptoms may be related to increased stressors.  3 brothers have died in the past year and fourth recently referred to hospice with terminal cancer.  Crying spells occur infrequently. No significant sleep difficulties. No suicidal ideation.  Allergies: 1)  ! Demerol 2)  ! Motrin 3)  ! Darvocet 4)  ! Morphine 5)  ! Darvon 6)  ! Caffeine 7)  ! Bactrim 8)  ! Penicillin V Potassium (Penicillin V Potassium)  Past History:  Past Medical History: Last updated: 05/25/2009 GERD / Esophageal strictures Allergic rhinitis Arthritis Diverticulosis Hypertension UTI osteoporosis Lumbar stenosis Chronic normocytic anemia  Review of Systems  The patient denies anorexia, fever, weight loss, chest pain, hemoptysis, abdominal pain, melena, and hematochezia.    Physical Exam  General:  Well-developed,well-nourished,in no acute distress; alert,appropriate and cooperative throughout examination Mouth:  Oral mucosa and oropharynx without lesions or exudates.  Teeth in good repair. Neck:  No deformities, masses, or tenderness noted. Lungs:  Normal respiratory effort, chest expands symmetrically. Lungs are clear to auscultation, no crackles or wheezes. Heart:  normal rate and regular rhythm.   Abdomen:  soft.  nondistended. Normal bowel  sounds. Mild midepigastric tenderness. No hepatomegaly or splenomegaly. No guarding or rebound   Impression & Recommendations:  Problem # 1:  ABDOMINAL PAIN, EPIGASTRIC (ICD-789.06) suspect some of her symptoms may be exacerbated by stress. Patient will take over-the-counter Zantac as supplement. Prompt followup with her gastroenterologist if symptoms persist  Complete Medication List: 1)  Caltrate 600+d 600-400 Mg-unit Tabs (Calcium carbonate-vitamin d) .... Two times a day 2)  Vit C 500mg   .... One daily 3)  Ferrous Sulfate 325 (65 Fe) Mg Tbec (Ferrous sulfate) .... One daily 4)  Nexium 40 Mg Cpdr (Esomeprazole magnesium) .... One tab two times a day 5)  Multi-day Plus Iron Tabs (Multiple vitamins-iron) .... Once daily 6)  Vitamin D 2000 Unit Tabs (Cholecalciferol) .... Once daily 7)  Amlodipine Besylate 5 Mg Tabs (Amlodipine besylate) .... One by mouth once daily 8)  Cyanocobalamin 1000 Mcg/ml Soln (Cyanocobalamin) .... Inject one ml im monthly 9)  Calcitonin (salmon) 200 Unit/act Soln (Calcitonin (salmon)) .... One spray to alternate nostril daily  Patient Instructions: 1)  May supplement with over-the-counter Zantac as needed 2)  Followup promptly if you have any swallowing difficulties, black stools, or vomiting of any blood. 3)  Continue regular use of Nexium. 4)  Followup with Dr. Jarold Motto if symptoms persist over the next couple of weeks.

## 2010-08-31 NOTE — Letter (Signed)
Summary: Generic Letter  [facility]  [address]   [place], [place] [address]   Phone: [phone]  Fax: [phone]    05/22/2007  Gloria [name] [address] [place], [place]  [address]  To Whom It May Concern  Patient my resume work duties.   If you have any questions please feel free to contact me immediately.  Sincerely,        Franchot Heidelberg MD [facility]

## 2010-09-02 NOTE — Assessment & Plan Note (Signed)
Summary: 2 week f/u//alp   Vital Signs:  Patient profile:   77 year old female Menstrual status:  postmenopausal Weight:      132 pounds Temp:     98.1 degrees F oral Pulse rate:   78 / minute Pulse rhythm:   regular Resp:     12 per minute BP sitting:   152 / 82  (left arm) Cuff size:   regular  Vitals Entered By: Sid Falcon LPN (August 25, 2010 10:59 AM)  History of Present Illness: Followup hypertension. Refer to prior note. Generally patient feels well. No headaches, dizziness, shortness of breath, or chest pains.  Patient had pneumonia 3 months ago had transient atrial fibrillation following that was briefly on diltiazem. Blood pressures stabilized and she eventually stopped medication.   she had no evidence for recurrent atrial fibrillation that was felt to be transient secondary probably to pneumonia. She stopped Dilltiazem herself secondary to some orthostatic symptoms.  Allergies: 1)  ! Demerol 2)  ! Motrin 3)  ! Darvocet 4)  ! Morphine 5)  ! Darvon 6)  ! Caffeine 7)  ! Bactrim 8)  ! Penicillin V Potassium (Penicillin V Potassium) 9)  ! Dilaudid (Hydromorphone Hcl)  Past History:  Past Medical History: Last updated: 04/07/2010 GERD / Esophageal strictures Allergic rhinitis Arthritis Diverticulosis Hypertension UTI, recurrent osteoporosis Lumbar stenosis Chronic normocytic anemia EGD/ED, 03/19/10--> Noncritical-appearing Schatzki's ring otherwise unremarkable esophagus status post passage of a 56-French Maloney dilator. Small hiatal hernia. Antral erythema with focal areaof adenomatous-appearing mucosa of uncertain significance status post biopsy. Path negative. B12 def    PMH reviewed for relevance  Review of Systems  The patient denies anorexia, fever, weight loss, chest pain, syncope, dyspnea on exertion, peripheral edema, prolonged cough, headaches, hemoptysis, and abdominal pain.    Physical Exam  General:  Well-developed,well-nourished,in no  acute distress; alert,appropriate and cooperative throughout examination Mouth:  Oral mucosa and oropharynx without lesions or exudates.  Teeth in good repair. Neck:  No deformities, masses, or tenderness noted. Lungs:  Normal respiratory effort, chest expands symmetrically. Lungs are clear to auscultation, no crackles or wheezes. Heart:  normal rate and regular rhythm.   Extremities:  no edema.   Impression & Recommendations:  Problem # 1:  HYPERTENSION (ICD-401.9) Assessment Deteriorated  add low-dose losartan 50 mg daily  Her updated medication list for this problem includes:    Losartan Potassium 50 Mg Tabs (Losartan potassium) ..... One by mouth once daily  Complete Medication List: 1)  Cyanocobalamin 1000 Mcg/ml Soln (Cyanocobalamin) .... Inject one ml im monthly 2)  Monoject Syringe 25g X 1" 3 Ml Misc (Syringe/needle (disp)) .... Use monthly as directed 3)  Nexium 40 Mg Cpdr (Esomeprazole magnesium) .... Take 1 tablet by mouth two times a day 4)  Mylanta  .... 2 tablespoons every 4 hours 5)  Pepcid  .... One tablet twice daily with the nexium 6)  Diazepam 2 Mg Tabs (Diazepam) .... One by mouth once daily as needed 7)  Miacalcin 200 Unit/act Soln (Calcitonin (salmon)) .... Use one spray alternate nostrils daily 8)  Nystatin 100000 Unit/gm Crea (Nystatin) .... Apply to affected rash twic daily 9)  Losartan Potassium 50 Mg Tabs (Losartan potassium) .... One by mouth once daily 10)  Triamcinolone Acetonide 0.1 % Crea (Triamcinolone acetonide) .... Apply to affected rash two times a day as needed  Patient Instructions: 1)  Please schedule a follow-up appointment in 1 month.  2)  Check your  Blood Pressure regularly . If it  is above:140/90   you should make an appointment. Prescriptions: TRIAMCINOLONE ACETONIDE 0.1 % CREA (TRIAMCINOLONE ACETONIDE) apply to affected rash two times a day as needed  #30 gm x 0   Entered and Authorized by:   Evelena Peat MD   Signed by:   Evelena Peat MD on 08/25/2010   Method used:   Electronically to        [facility] [address] (retail)       [address]       [place], [place]  [address]       Ph: [phone]       Fax: [phone]   RxID:   [id number] LOSARTAN POTASSIUM 50 MG TABS (LOSARTAN POTASSIUM) one by mouth once daily  #30 x 5   Entered and Authorized by:   Evelena Peat MD   Signed by:   Evelena Peat MD on 08/25/2010   Method used:   Electronically to        [facility] (retail)       [address]       [place], [place]  [address]       Ph: [phone]       Fax: [phone]   RxID:   [id number]

## 2010-09-02 NOTE — Letter (Signed)
Summary: AUTH FOR THE RELEASE OF MEDICAL INFO  AUTH FOR THE RELEASE OF MEDICAL INFO   Imported By: Rexene Alberts 07/22/2010 10:46:40  _____________________________________________________________________  External Attachment:    Type:   Image     Comment:   External Document

## 2010-09-02 NOTE — Assessment & Plan Note (Signed)
Summary: 3 month rov/njr   Vital Signs:  Patient profile:   77 year old female Menstrual status:  postmenopausal Weight:      132 pounds Temp:     98.1 degrees F oral BP sitting:   170 / 90  (left arm) Cuff size:   regular  Vitals Entered By: Sid Falcon LPN (August 11, 2010 10:15 AM)   History of Present Illness: Patient here for medical followup  Recent pneumonia. Cough has resolved. No recurrent fever. Generalized malaise.  She is ambulating more.  New problem of pruritic rash under both breasts. Has tried hydrocortisone cream and cornstarch without improvement.  Recent atrial fibrillation during hospitalization with pneumonia. Patient was treated with multiple medications including digoxin and diltiazem but has not had any recurrent arrhythmia. Poor Coumadin candidate secondary to risk of GI bleed.  Chronic anemia with recent hemoglobin 9.4. No orthostatic symptoms.  Patient has osteoporosis and has taken miacalcin nasal spray. We have many times gone over other options for more aggressive management. She has been intolerant of bisphosphonates. She refuses Reclast  or Prolia GERD stable on Nexium.  Pt requesting samples of medication.  Allergies: 1)  ! Demerol 2)  ! Motrin 3)  ! Darvocet 4)  ! Morphine 5)  ! Darvon 6)  ! Caffeine 7)  ! Bactrim 8)  ! Penicillin V Potassium (Penicillin V Potassium) 9)  ! Dilaudid (Hydromorphone Hcl)  Past History:  Past Medical History: Last updated: 2010/04/16 GERD / Esophageal strictures Allergic rhinitis Arthritis Diverticulosis Hypertension UTI, recurrent osteoporosis Lumbar stenosis Chronic normocytic anemia EGD/ED, 03/19/10--> Noncritical-appearing Schatzki's ring otherwise unremarkable esophagus status post passage of a 56-French Maloney dilator. Small hiatal hernia. Antral erythema with focal areaof adenomatous-appearing mucosa of uncertain significance status post biopsy. Path negative. B12 def     Past Surgical  History: Last updated: 10/21/2008 Appendectomy 1960 Cholecystectomy 1999 Hysterectomy 1969 Tonsillectomy 1954  Family History: Last updated: 04/16/2010 Father: deceased 87 Leukemia Mother: deceased 52 Staph Infection after hip replacement; HTN 4 brothers: DM, COPD, reflux, Multiple Myeloma 1 child: healthy Family History of Prostate Cancer:Brothers x2 No FH of CRC.  Social History: Last updated: 02/05/2007 Divorced Former Smoker Alcohol use-no Drug use-no Retired: Corporate treasurer  Risk Factors: Alcohol Use: 0 (03/10/2010) Caffeine Use: 0 (03/10/2010) Exercise: yes (03/10/2010)  Risk Factors: Smoking Status: never (03/10/2010) PMH-FH-SH reviewed for relevance  Review of Systems  The patient denies anorexia, fever, weight loss, hoarseness, chest pain, syncope, dyspnea on exertion, peripheral edema, prolonged cough, headaches, hemoptysis, abdominal pain, melena, hematochezia, severe indigestion/heartburn, and depression.    Physical Exam  General:  Well-developed,well-nourished,in no acute distress; alert,appropriate and cooperative throughout examination Ears:  External ear exam shows no significant lesions or deformities.  Otoscopic examination reveals clear canals, tympanic membranes are intact bilaterally without bulging, retraction, inflammation or discharge. Hearing is grossly normal bilaterally. Mouth:  Oral mucosa and oropharynx without lesions or exudates.  Teeth in good repair. Neck:  No deformities, masses, or tenderness noted. Lungs:  Normal respiratory effort, chest expands symmetrically. Lungs are clear to auscultation, no crackles or wheezes. Heart:  normal rate and regular rhythm.   Abdomen:  soft and non-tender.   Msk:  kyphotic posture. Extremities:  no pitting edema. Neurologic:  alert & oriented X3 and cranial nerves II-XII intact.   Skin:  rash under both breasts erythematous and some papules with no signif scaling.  No pustules. Cervical Nodes:  No  lymphadenopathy noted Psych:  normally interactive, good eye contact, not anxious appearing, and  not depressed appearing.     Impression & Recommendations:  Problem # 1:  ATRIAL FIBRILLATION (ICD-427.31) currently sinus rhythm.  Suspect related to recent PNA  Problem # 2:  HYPERTENSION (ICD-401.9) poor control today.  Reasses 2 weeks.  Problem # 3:  CANDIDIASIS, SKIN (ICD-112.3) Assessment: New nystatin cream.  Problem # 4:  GERD (ICD-530.81)  Her updated medication list for this problem includes:    Nexium 40 Mg Cpdr (Esomeprazole magnesium) .Marland Kitchen... Take 1 tablet by mouth two times a day  Problem # 5:  OSTEOPOROSIS NOS (ICD-733.00) pt refuses other rx ooptions. Her updated medication list for this problem includes:    Miacalcin 200 Unit/act Soln (Calcitonin (salmon)) ..... Use one spray alternate nostrils daily  Complete Medication List: 1)  Cyanocobalamin 1000 Mcg/ml Soln (Cyanocobalamin) .... Inject one ml im monthly 2)  Monoject Syringe 25g X 1" 3 Ml Misc (Syringe/needle (disp)) .... Use monthly as directed 3)  Nexium 40 Mg Cpdr (Esomeprazole magnesium) .... Take 1 tablet by mouth two times a day 4)  Mylanta  .... 2 tablespoons every 4 hours 5)  Pepcid  .... One tablet twice daily with the nexium 6)  Diazepam 2 Mg Tabs (Diazepam) .... One by mouth once daily as needed 7)  Miacalcin 200 Unit/act Soln (Calcitonin (salmon)) .... Use one spray alternate nostrils daily 8)  Nystatin 100000 Unit/gm Crea (Nystatin) .... Apply to affected rash twic daily  Patient Instructions: 1)  Please schedule a follow-up appointment in 2 weeks.  2)  Limit your Sodium(salt) .  Prescriptions: NYSTATIN 100000 UNIT/GM CREA (NYSTATIN) apply to affected rash twic daily  #30 gm x 1   Entered and Authorized by:   Evelena Peat MD   Signed by:   Evelena Peat MD on 08/11/2010   Method used:   Electronically to        [facility] (retail)       [address]       [place], [place]  [address]       Ph: [phone]       Fax: [phone]   RxID:   [id number]    Orders Added: 1)  Est. Patient Level IV [84132]

## 2010-09-03 ENCOUNTER — Ambulatory Visit (INDEPENDENT_AMBULATORY_CARE_PROVIDER_SITE_OTHER): Payer: Medicare Other | Admitting: Family Medicine

## 2010-09-03 ENCOUNTER — Encounter: Payer: Self-pay | Admitting: Family Medicine

## 2010-09-03 VITALS — BP 140/76 | HR 84 | Temp 98.1°F | Resp 16 | Ht 62.0 in | Wt 130.0 lb

## 2010-09-03 DIAGNOSIS — I951 Orthostatic hypotension: Secondary | ICD-10-CM

## 2010-09-03 DIAGNOSIS — N390 Urinary tract infection, site not specified: Secondary | ICD-10-CM

## 2010-09-03 DIAGNOSIS — H04129 Dry eye syndrome of unspecified lacrimal gland: Secondary | ICD-10-CM

## 2010-09-03 DIAGNOSIS — H04123 Dry eye syndrome of bilateral lacrimal glands: Secondary | ICD-10-CM

## 2010-09-03 LAB — POCT URINALYSIS DIPSTICK
Bilirubin, UA: NEGATIVE
Glucose, UA: NEGATIVE
Ketones, UA: NEGATIVE
Spec Grav, UA: 1.015

## 2010-09-03 MED ORDER — CYCLOSPORINE 0.05 % OP EMUL: 1.0000 [drp] | Freq: Two times a day (BID) | OPHTHALMIC | Status: DC

## 2010-09-03 MED ORDER — CEPHALEXIN 500 MG PO CAPS: 500.0000 mg | ORAL_CAPSULE | Freq: Three times a day (TID) | ORAL | Status: AC

## 2010-09-03 NOTE — Progress Notes (Signed)
  Subjective:    Patient ID: Gloria [name], female    DOB: 02-16-1930, 77 y.o.   MRN: [id number]  HPI Patient seen with following items  Recurrent urinary tract infection history. Doing well until couple days ago she also urinary frequency and burning with urination. Denies any nausea or vomiting. No fever or chills. Drinking plenty of fluids. No infections since last July. Has tolerated Keflex well in the past.  Patient has history of dry eyes.  She has been using over-the-counter lubricants without improvement. Denies any blurred vision or eye drainage. No history of any herpes infection around the eye. She is requesting prescription for Restasis  Episode of low blood pressure this morning with recorded blood pressure of around 85/50. She had some orthostatic symptoms. After eating some sodium and increase fluid symptoms have resolved She had losartan dosage this morning and feels fine at this time. No chest pains or dyspnea.   Review of Systems     Objective:   Physical Exam    patient is alert in no distress Oropharynx is moist and clear chest is clear to auscultation Heart regular rhythm and rate Abdomen soft nontender Back no CVA tenderness Extremities no pitting edema Skin no rash     Assessment & Plan:  #1  recurrent urinary tract infection. Urine culture obtained. Start Keflex 500 mg 3 times a day for 7 days. History of multiple drug allergies #2  bilateral dry eyes. Restasis eyedrops one drop each eye twice a day #3  episode of orthostasis this morning. Resolved with increase fluid intake. Continue to monitor.

## 2010-09-03 NOTE — Patient Instructions (Signed)
Urinary Tract Infection (UTI)   Infections in the urinary tract can start in several places. A bladder infection (cystitis), a kidney infection (pyelonephritis), and a prostate infection (prostatitis) are different types of urinary tract infections. They usually get better if treated with antibiotics. Antibiotics are medicines that can help kill bacteria germs. Take all the medicine given to you until it is gone. You may feel better in a few days, but TAKE ALL MEDICINE or the infection may not respond and become more difficult to treat.    HOME CARE INSTRUCTIONS  Drink a lot of fluid, 3 to 4 quarts a day. Cranberry juice is especially recommended, in addition to large amounts of water.  Avoid caffeine, tea and carbonated beverages. They tend to irritate the bladder.  Alcohol may irritate the prostate.  Only take over-the-counter or prescription medicines for pain, discomfort, or fever as directed by your caregiver.   FINDING OUT THE RESULTS OF YOUR TEST Not all test results are available during your visit. If your test results are not back during the visit, make an appointment with your caregiver to find out the results. Do not assume everything is normal if you have not heard from your caregiver or the medical facility. It is important for you to follow up on all of your test results.    TO PREVENT FURTHER INFECTIONS:  Empty the bladder often. Avoid holding urine for long periods of time.  After a bowel movement, women should cleanse from front to back. Use each tissue only once.  Empty the bladder before and after sexual intercourse.  If you develop back pain, fever, sickness to your stomach (nausea), vomiting, or your problems (symptoms) are no better in 3 days, return to your caregiver. Return sooner if you are getting worse.   SEEK IMMEDIATE MEDICAL CARE IF YOU:  Develop severe back pain or lower abdominal pain.  Develop chills and fever.  Develop nausea or vomiting.  Have  continued burning or discomfort with urination.   MAKE SURE YOU:   Understand these instructions.   Will watch your condition.  Will get help right away if you are not doing well or get worse.   Document Released: 04/27/2005  Document Re-Released: 08/09/2009 [employer] Patient Information 2011 [employer]. 

## 2010-09-04 ENCOUNTER — Encounter: Payer: Self-pay | Admitting: Family Medicine

## 2010-09-06 LAB — URINE CULTURE

## 2010-09-22 ENCOUNTER — Ambulatory Visit (INDEPENDENT_AMBULATORY_CARE_PROVIDER_SITE_OTHER): Payer: Medicare Other | Admitting: Family Medicine

## 2010-09-22 ENCOUNTER — Encounter: Payer: Self-pay | Admitting: Family Medicine

## 2010-09-22 VITALS — BP 140/78 | Temp 97.7°F | Ht 61.0 in | Wt 130.0 lb

## 2010-09-22 DIAGNOSIS — Z833 Family history of diabetes mellitus: Secondary | ICD-10-CM

## 2010-09-22 DIAGNOSIS — I10 Essential (primary) hypertension: Secondary | ICD-10-CM

## 2010-09-22 DIAGNOSIS — Z8349 Family history of other endocrine, nutritional and metabolic diseases: Secondary | ICD-10-CM

## 2010-09-22 LAB — GLUCOSE, POCT (MANUAL RESULT ENTRY): POC Glucose: 80

## 2010-09-22 NOTE — Progress Notes (Signed)
  Subjective:    Patient ID: Gloria [name], female    DOB: June 17, 1930, 77 y.o.   MRN: [id number]  HPI  Patient seen for followup hypertension. Initiated losartan 50 mg daily last visit. She had had several blood pressures over 140 systolic. Patient only took medication for a couple of days. She did not have any side effects but had several readings in the low 100s systolic range. She states blood pressure was 109/70 yesterday. No orthostatic symptoms at this time. Denies any shortness of breath or chest pain.   Patient requests blood sugar. Positive family history in several siblings. No symptoms of hyperglycemia.   Review of Systems  denies any  fever, chills, chest pain , or dizziness    Objective:   Physical Exam     Patient is alert and in no distress. Blood pressure 140 over 78   oropharynx is clear Chest clear to auscultation Heart regular rhythm and rate Extremities no edema    Assessment & Plan:  #1 hypertension currently stable by home readings off medication. Probable whitecoat syndrome. Routine followup 4 months.  #2 positive family history type 2 diabetes. Patient requests blood sugar check

## 2010-09-22 NOTE — Patient Instructions (Signed)
Sodium-Controlled Diet   Sodium is a mineral. It is abundant in many foods. Sodium may be found naturally. It can be added during the making of a food. The most common form is salt, which is composed of sodium and chloride. Reducing your sodium intake involves changing your eating behavior.    The following guidelines will help you reduce the sodium in your diet:  Stop using the salt shaker.  Use salt sparingly in cooking and baking.  Substitute sodium-free seasonings and spices.  Do not use a salt substitute (potassium chloride) without your caregiver's permission.  Include a variety of fresh, unprocessed foods in your diet.  Limit the use of processed and convenience foods high in sodium.  Use the general guidelines listed below.   USE THE FOLLOWING FOODS SPARINGLY:  BREADS/STARCHES                              l Commercial bread stuffing; commercial pancake or waffle mixes; coating mixes; waffles. Croutons. Prepared (boxed or frozen) potato, rice, or noodle mixes that contain salt or sodium. Salted [place] fries or hash browns. Salted popcorn, breads, crackers, chips, or snack foods.     VEGETABLES                                             l Vegetables canned with salt or prepared in cream, butter, or cheese sauces. Sauerkraut. Tomato or vegetable juices canned with salt. Fresh vegetables are allowed if rinsed thoroughly.    FRUIT                                                  l Fruit is okay to eat.    MEAT & SUBSTITUTES                 l Salted or smoked meats such as bacon or Canadian bacon; chipped or corned beef; hot dogs; salt pork; luncheon meats; pastrami; ham; sausage. Canned or smoked fish, poultry, or meat; processed cheese or cheese spreads blue or Roquefort cheese; battered, frozen fish products; prepared spaghetti sauce; baked beans; Reuben sandwiches. Salted nuts. Caviar.    MILK       l Limit buttermilk to 1 cup per week.         SOUPS & COMBINATION  FOODS            l Bouillon cubes; canned or dried soups; broth; consomm (prepared with salt or sodium). Convenience (frozen or packaged) dinners with more than 600 mg sodium, pot pies; pizza; Asian food; fast food cheeseburgers and specialty sandwiches.    DESSERTS & SWEETS                                l Eat in moderation. l More than 1 serving per day of regular (salted) desserts; pie; commercial fruit snack pies; commercial snack cakes; canned puddings.    FATS & OILS        l Eat in moderation.  l Gravy mixes or canned gravy. No more than 1 to 2 Tbsp. of salad dressing ([place] or [place] type). Chip dips.    BEVERAGES                                                  l See those listed under vegetable and milk groups.    CONDIMENTS/ MISCELLANEOUS           l ketchup, mustard, meat sauces, salsa, regular (salted) soy sauce or mustard, dill pickles, olives, meat tenderizer. Prepared horseradish or pickle relish. [place] processed cocoa; baking powder or baking soda used medicinally. Worcestershire sauce. "Light" salt. Salt substitute unless approved by caregiver.   Document Released: 01/07/2002  Document Re-Released: 08/09/2009 [facility] Patient Information 2011 [place], [place].Hypertension (High Blood Pressure)   As your heart beats, it forces blood through your arteries. This force is your blood pressure. If the pressure is too high, it is called hypertension (HTN) or high blood pressure. HTN is dangerous because you may have it and not know it. High blood pressure may mean that your heart has to work harder to pump blood. Your arteries may be narrow or stiff. The extra work puts you at risk for heart disease, stroke, and other problems.    Blood pressure consists of two numbers, a higher number over a lower, 110/72, for example. It is stated as "110 over 72." The ideal is below 120 for the top number (systolic) and under 80 for the bottom  (diastolic).   You should pay close attention to your blood pressure if you have certain conditions such as:  Heart failure.  Prior heart attack.   Diabetes  Chronic kidney disease.   Prior stroke.  Multiple risk factors for heart disease.    To see if you have HTN, your blood pressure should be measured while you are seated with your arm held at the level of the heart. It should be measured at least twice. A one-time elevated blood pressure reading (especially in the Emergency Department) does not mean that you need treatment. There may be conditions in which the blood pressure is different between your right and left arms. It is important to see your caregiver soon for a recheck.   Most people have essential hypertension which means that there is not a specific cause. This type of high blood pressure may be lowered by changing lifestyle factors such as:  Stress.  Smoking.  Lack of exercise.  Excessive weight.  Drug/tobacco/alcohol use.   Eating less salt.     Most people do not have symptoms from high blood pressure until it has caused damage to the body. Effective treatment can often prevent, delay or reduce that damage.   TREATMENT: Treatment for high blood pressure, when a cause has been identified, is directed at the cause. There are a large number of medications to treat HTN. These fall into several categories, and your caregiver will help you select the medicines that are best for you. Medications may have side effects. You should review side effects with your caregiver.   If your blood pressure stays high after you have made lifestyle changes or started on medicines,   Your medication(s) may need to be changed.  Other problems may need to be addressed.  Be certain you understand your prescriptions, and know how and when to  take your medicine.  Be sure to follow up with your caregiver within the time frame advised (usually within two weeks) to have your blood  pressure rechecked and to review your medications.  If you are taking more than one medicine to lower your blood pressure, make sure you know how and at what times they should be taken. Taking two medicines at the same time can result in blood pressure that is too low.   SEEK IMMEDIATE MEDICAL CARE IF YOU DEVELOP:  A severe headache, blurred or changing vision, or confusion.  Unusual weakness or numbness, or a faint feeling.  Severe chest or abdominal pain, vomiting, or breathing problems.   It is your responsibility to follow up with your caregiver in order to determine if your elevated blood pressure should be treated.     MAKE SURE YOU:   Understand these instructions.   Will watch your condition.  Will get help right away if you are not doing well or get worse.   Document Released: 12/29/2005  Document Re-Released: 10/14/2008 [facility] Patient Information 2011 [place], [place].

## 2010-09-27 ENCOUNTER — Other Ambulatory Visit: Payer: Self-pay | Admitting: Family Medicine

## 2010-09-27 DIAGNOSIS — Z8719 Personal history of other diseases of the digestive system: Secondary | ICD-10-CM

## 2010-10-06 ENCOUNTER — Other Ambulatory Visit: Payer: Self-pay | Admitting: Family Medicine

## 2010-10-12 LAB — DIFFERENTIAL
Basophils Absolute: 0 10*3/uL (ref 0.0–0.1)
Basophils Relative: 1 % (ref 0–1)
Eosinophils Absolute: 0.4 10*3/uL (ref 0.0–0.7)
Eosinophils Relative: 8 % — ABNORMAL HIGH (ref 0–5)
Monocytes Absolute: 0.4 10*3/uL (ref 0.1–1.0)
Monocytes Relative: 10 % (ref 3–12)

## 2010-10-12 LAB — CBC
HCT: 26.6 % — ABNORMAL LOW (ref 36.0–46.0)
Hemoglobin: 8.9 g/dL — ABNORMAL LOW (ref 12.0–15.0)
MCH: 29.8 pg (ref 26.0–34.0)
MCHC: 33.6 g/dL (ref 30.0–36.0)
MCV: 88.7 fL (ref 78.0–100.0)
RDW: 14 % (ref 11.5–15.5)

## 2010-10-13 LAB — BLOOD GAS, ARTERIAL
Acid-base deficit: 1.2 mmol/L (ref 0.0–2.0)
O2 Content: 2 L/min
O2 Saturation: 98.5 %
Patient temperature: 37
TCO2: 21.4 mmol/L (ref 0–100)
pCO2 arterial: 35.7 mmHg (ref 35.0–45.0)

## 2010-10-13 LAB — CBC
HCT: 24.8 % — ABNORMAL LOW (ref 36.0–46.0)
HCT: 26.5 % — ABNORMAL LOW (ref 36.0–46.0)
HCT: 26.7 % — ABNORMAL LOW (ref 36.0–46.0)
HCT: 27.8 % — ABNORMAL LOW (ref 36.0–46.0)
Hemoglobin: 8.9 g/dL — ABNORMAL LOW (ref 12.0–15.0)
Hemoglobin: 9.3 g/dL — ABNORMAL LOW (ref 12.0–15.0)
MCH: 29.7 pg (ref 26.0–34.0)
MCH: 29.8 pg (ref 26.0–34.0)
MCH: 29.8 pg (ref 26.0–34.0)
MCHC: 33.3 g/dL (ref 30.0–36.0)
MCHC: 33.5 g/dL (ref 30.0–36.0)
MCHC: 33.6 g/dL (ref 30.0–36.0)
MCHC: 34 g/dL (ref 30.0–36.0)
MCV: 88.6 fL (ref 78.0–100.0)
MCV: 88.6 fL (ref 78.0–100.0)
MCV: 88.8 fL (ref 78.0–100.0)
MCV: 89 fL (ref 78.0–100.0)
Platelets: 196 10*3/uL (ref 150–400)
Platelets: 198 10*3/uL (ref 150–400)
Platelets: 204 10*3/uL (ref 150–400)
Platelets: 217 10*3/uL (ref 150–400)
RBC: 2.7 MIL/uL — ABNORMAL LOW (ref 3.87–5.11)
RDW: 13.5 % (ref 11.5–15.5)
RDW: 13.6 % (ref 11.5–15.5)
RDW: 13.8 % (ref 11.5–15.5)
RDW: 14 % (ref 11.5–15.5)
WBC: 3.9 10*3/uL — ABNORMAL LOW (ref 4.0–10.5)
WBC: 4.4 10*3/uL (ref 4.0–10.5)
WBC: 4.5 10*3/uL (ref 4.0–10.5)
WBC: 5.5 10*3/uL (ref 4.0–10.5)

## 2010-10-13 LAB — BASIC METABOLIC PANEL
BUN: 10 mg/dL (ref 6–23)
BUN: 15 mg/dL (ref 6–23)
BUN: 19 mg/dL (ref 6–23)
BUN: 27 mg/dL — ABNORMAL HIGH (ref 6–23)
BUN: 7 mg/dL (ref 6–23)
CO2: 21 mEq/L (ref 19–32)
CO2: 24 mEq/L (ref 19–32)
Calcium: 8.1 mg/dL — ABNORMAL LOW (ref 8.4–10.5)
Calcium: 8.4 mg/dL (ref 8.4–10.5)
Calcium: 8.4 mg/dL (ref 8.4–10.5)
Calcium: 8.5 mg/dL (ref 8.4–10.5)
Chloride: 103 mEq/L (ref 96–112)
Chloride: 108 mEq/L (ref 96–112)
Chloride: 109 mEq/L (ref 96–112)
Chloride: 112 mEq/L (ref 96–112)
Creatinine, Ser: 0.97 mg/dL (ref 0.4–1.2)
Creatinine, Ser: 1.08 mg/dL (ref 0.4–1.2)
Creatinine, Ser: 1.09 mg/dL (ref 0.4–1.2)
Creatinine, Ser: 1.27 mg/dL — ABNORMAL HIGH (ref 0.4–1.2)
GFR calc Af Amer: 49 mL/min — ABNORMAL LOW (ref >60)
GFR calc non Af Amer: 49 mL/min — ABNORMAL LOW (ref >60)
GFR calc non Af Amer: 49 mL/min — ABNORMAL LOW (ref >60)
GFR calc non Af Amer: >60 mL/min (ref >60)
Glucose, Bld: 123 mg/dL — ABNORMAL HIGH (ref 70–99)
Glucose, Bld: 83 mg/dL (ref 70–99)
Glucose, Bld: 84 mg/dL (ref 70–99)
Glucose, Bld: 87 mg/dL (ref 70–99)
Potassium: 3.9 mEq/L (ref 3.5–5.1)
Potassium: 4 mEq/L (ref 3.5–5.1)
Potassium: 4.1 mEq/L (ref 3.5–5.1)
Sodium: 138 mEq/L (ref 135–145)
Sodium: 138 mEq/L (ref 135–145)

## 2010-10-13 LAB — DIFFERENTIAL
Basophils Absolute: 0 10*3/uL (ref 0.0–0.1)
Basophils Absolute: 0 10*3/uL (ref 0.0–0.1)
Basophils Absolute: 0 10*3/uL (ref 0.0–0.1)
Basophils Absolute: 0 10*3/uL (ref 0.0–0.1)
Basophils Relative: 0 % (ref 0–1)
Basophils Relative: 0 % (ref 0–1)
Basophils Relative: 1 % (ref 0–1)
Basophils Relative: 1 % (ref 0–1)
Eosinophils Absolute: 0 10*3/uL (ref 0.0–0.7)
Eosinophils Absolute: 0.3 10*3/uL (ref 0.0–0.7)
Eosinophils Absolute: 0.5 10*3/uL (ref 0.0–0.7)
Eosinophils Relative: 0 % (ref 0–5)
Eosinophils Relative: 11 % — ABNORMAL HIGH (ref 0–5)
Eosinophils Relative: 3 % (ref 0–5)
Eosinophils Relative: 6 % — ABNORMAL HIGH (ref 0–5)
Lymphocytes Relative: 25 % (ref 12–46)
Lymphs Abs: 0.8 10*3/uL (ref 0.7–4.0)
Lymphs Abs: 1.1 10*3/uL (ref 0.7–4.0)
Lymphs Abs: 1.2 10*3/uL (ref 0.7–4.0)
Monocytes Absolute: 0.3 10*3/uL (ref 0.1–1.0)
Monocytes Absolute: 0.3 10*3/uL (ref 0.1–1.0)
Monocytes Absolute: 0.4 10*3/uL (ref 0.1–1.0)
Monocytes Absolute: 0.4 10*3/uL (ref 0.1–1.0)
Monocytes Relative: 10 % (ref 3–12)
Monocytes Relative: 7 % (ref 3–12)
Monocytes Relative: 8 % (ref 3–12)
Monocytes Relative: 9 % (ref 3–12)
Neutro Abs: 2.5 10*3/uL (ref 1.7–7.7)
Neutro Abs: 4.3 10*3/uL (ref 1.7–7.7)
Neutrophils Relative %: 52 % (ref 43–77)

## 2010-10-13 LAB — CARDIAC PANEL(CRET KIN+CKTOT+MB+TROPI)
CK, MB: 1.5 ng/mL (ref 0.3–4.0)
CK, MB: 1.6 ng/mL (ref 0.3–4.0)
CK, MB: 2.3 ng/mL (ref 0.3–4.0)
Relative Index: INVALID (ref 0.0–2.5)
Relative Index: INVALID (ref 0.0–2.5)
Total CK: 57 U/L (ref 7–177)
Troponin I: 0.03 ng/mL (ref 0.00–0.06)
Troponin I: 0.05 ng/mL (ref 0.00–0.06)

## 2010-10-13 LAB — TYPE AND SCREEN
ABO/RH(D): A NEG
Unit division: 0

## 2010-10-13 LAB — INFLUENZA PANEL BY PCR (TYPE A & B): Influenza B By PCR: NEGATIVE

## 2010-10-13 LAB — HEMOCCULT GUIAC POC 1CARD (OFFICE): Fecal Occult Bld: NEGATIVE

## 2010-10-13 LAB — MAGNESIUM: Magnesium: 1.9 mg/dL (ref 1.5–2.5)

## 2010-10-15 LAB — CBC
HCT: 28 % — ABNORMAL LOW (ref 36.0–46.0)
HCT: 28.4 % — ABNORMAL LOW (ref 36.0–46.0)
Hemoglobin: 9 g/dL — ABNORMAL LOW (ref 12.0–15.0)
MCH: 30 pg (ref 26.0–34.0)
MCH: 30.4 pg (ref 26.0–34.0)
MCH: 30.6 pg (ref 26.0–34.0)
MCH: 30.7 pg (ref 26.0–34.0)
MCHC: 33.3 g/dL (ref 30.0–36.0)
MCHC: 33.4 g/dL (ref 30.0–36.0)
MCHC: 34.1 g/dL (ref 30.0–36.0)
MCHC: 33.1 g/dL (ref 30.0–36.0)
MCV: 90.2 fL (ref 78.0–100.0)
MCV: 90.3 fL (ref 78.0–100.0)
MCV: 91.1 fL (ref 78.0–100.0)
Platelets: 243 10*3/uL (ref 150–400)
Platelets: 245 10*3/uL (ref 150–400)
Platelets: 246 10*3/uL (ref 150–400)
Platelets: 260 10*3/uL (ref 150–400)
RBC: 3 MIL/uL — ABNORMAL LOW (ref 3.87–5.11)
RBC: 3.02 MIL/uL — ABNORMAL LOW (ref 3.87–5.11)
RBC: 3.08 MIL/uL — ABNORMAL LOW (ref 3.87–5.11)
RDW: 13.6 % (ref 11.5–15.5)
RDW: 14 % (ref 11.5–15.5)
RDW: 13.9 % (ref 11.5–15.5)

## 2010-10-15 LAB — POCT CARDIAC MARKERS
CKMB, poc: <1 ng/mL — ABNORMAL LOW (ref 1.0–8.0)
Myoglobin, poc: 93 ng/mL (ref 12–200)
Troponin i, poc: <0.05 ng/mL (ref 0.00–0.09)
Troponin i, poc: <0.05 ng/mL (ref 0.00–0.09)
Troponin i, poc: <0.05 ng/mL (ref 0.00–0.09)
Troponin i, poc: <0.05 ng/mL (ref 0.00–0.09)

## 2010-10-15 LAB — DIFFERENTIAL
Basophils Absolute: 0 10*3/uL (ref 0.0–0.1)
Basophils Absolute: 0 10*3/uL (ref 0.0–0.1)
Basophils Absolute: 0 10*3/uL (ref 0.0–0.1)
Basophils Relative: 0 % (ref 0–1)
Eosinophils Absolute: 0.1 10*3/uL (ref 0.0–0.7)
Eosinophils Absolute: 0.2 10*3/uL (ref 0.0–0.7)
Eosinophils Absolute: 0.2 10*3/uL (ref 0.0–0.7)
Eosinophils Relative: 3 % (ref 0–5)
Eosinophils Relative: 3 % (ref 0–5)
Eosinophils Relative: 4 % (ref 0–5)
Eosinophils Relative: 5 % (ref 0–5)
Lymphocytes Relative: 25 % (ref 12–46)
Lymphocytes Relative: 27 % (ref 12–46)
Lymphocytes Relative: 33 % (ref 12–46)
Lymphs Abs: 1 10*3/uL (ref 0.7–4.0)
Lymphs Abs: 1.1 10*3/uL (ref 0.7–4.0)
Lymphs Abs: 1.5 10*3/uL (ref 0.7–4.0)
Monocytes Absolute: 0.3 10*3/uL (ref 0.1–1.0)
Monocytes Absolute: 0.4 10*3/uL (ref 0.1–1.0)
Monocytes Relative: 8 % (ref 3–12)
Neutro Abs: 2.8 10*3/uL (ref 1.7–7.7)
Neutro Abs: 3.4 10*3/uL (ref 1.7–7.7)
Neutrophils Relative %: 63 % (ref 43–77)

## 2010-10-15 LAB — COMPREHENSIVE METABOLIC PANEL
ALT: 26 U/L (ref 0–35)
ALT: 34 U/L (ref 0–35)
AST: 23 U/L (ref 0–37)
AST: 33 U/L (ref 0–37)
Albumin: 3.8 g/dL (ref 3.5–5.2)
BUN: 24 mg/dL — ABNORMAL HIGH (ref 6–23)
CO2: 24 mEq/L (ref 19–32)
Calcium: 9.1 mg/dL (ref 8.4–10.5)
Chloride: 104 mEq/L (ref 96–112)
Chloride: 106 mEq/L (ref 96–112)
Creatinine, Ser: 0.92 mg/dL (ref 0.4–1.2)
Creatinine, Ser: 1.04 mg/dL (ref 0.4–1.2)
GFR calc Af Amer: >60 mL/min (ref >60)
GFR calc Af Amer: >60 mL/min (ref >60)
GFR calc non Af Amer: 51 mL/min — ABNORMAL LOW (ref >60)
Sodium: 136 mEq/L (ref 135–145)
Sodium: 141 mEq/L (ref 135–145)
Total Bilirubin: 0.4 mg/dL (ref 0.3–1.2)
Total Bilirubin: 0.5 mg/dL (ref 0.3–1.2)
Total Protein: 7.5 g/dL (ref 6.0–8.3)

## 2010-10-15 LAB — D-DIMER, QUANTITATIVE: D-Dimer, Quant: 1.59 ug/mL-FEU — ABNORMAL HIGH (ref 0.00–0.48)

## 2010-10-15 LAB — CARDIAC PANEL(CRET KIN+CKTOT+MB+TROPI)
CK, MB: 1.5 ng/mL (ref 0.3–4.0)
Relative Index: INVALID (ref 0.0–2.5)
Total CK: 65 U/L (ref 7–177)
Troponin I: 0.02 ng/mL (ref 0.00–0.06)

## 2010-10-15 LAB — BASIC METABOLIC PANEL
BUN: 27 mg/dL — ABNORMAL HIGH (ref 6–23)
CO2: 25 mEq/L (ref 19–32)
CO2: 26 mEq/L (ref 19–32)
Calcium: 8.8 mg/dL (ref 8.4–10.5)
Chloride: 104 mEq/L (ref 96–112)
Creatinine, Ser: 0.82 mg/dL (ref 0.4–1.2)
Creatinine, Ser: 1.02 mg/dL (ref 0.4–1.2)
Glucose, Bld: 104 mg/dL — ABNORMAL HIGH (ref 70–99)
Glucose, Bld: 82 mg/dL (ref 70–99)

## 2010-10-15 LAB — CK TOTAL AND CKMB (NOT AT ARMC)
CK, MB: 1.9 ng/mL (ref 0.3–4.0)
CK, MB: 2 ng/mL (ref 0.3–4.0)
Relative Index: INVALID (ref 0.0–2.5)

## 2010-10-15 LAB — POCT I-STAT, CHEM 8
Calcium, Ion: 1.12 mmol/L (ref 1.12–1.32)
Chloride: 110 mEq/L (ref 96–112)
HCT: 30 % — ABNORMAL LOW (ref 36.0–46.0)
TCO2: 20 mmol/L (ref 0–100)

## 2010-10-15 LAB — TROPONIN I
Troponin I: 0.01 ng/mL (ref 0.00–0.06)
Troponin I: 0.02 ng/mL (ref 0.00–0.06)

## 2010-11-08 ENCOUNTER — Ambulatory Visit (INDEPENDENT_AMBULATORY_CARE_PROVIDER_SITE_OTHER): Payer: Medicare Other | Admitting: Family Medicine

## 2010-11-08 ENCOUNTER — Encounter: Payer: Self-pay | Admitting: Family Medicine

## 2010-11-08 VITALS — BP 140/68 | Temp 98.7°F | Wt 130.0 lb

## 2010-11-08 DIAGNOSIS — R350 Frequency of micturition: Secondary | ICD-10-CM

## 2010-11-08 DIAGNOSIS — R21 Rash and other nonspecific skin eruption: Secondary | ICD-10-CM

## 2010-11-08 LAB — POCT URINALYSIS DIPSTICK
Bilirubin, UA: POSITIVE
Glucose, UA: NEGATIVE
Nitrite, UA: NEGATIVE

## 2010-11-08 NOTE — Patient Instructions (Signed)
Try prescription steroid cream to rash twice daily and be in touch 2 weeks if no better.

## 2010-11-08 NOTE — Progress Notes (Signed)
  Subjective:    Patient ID: Gloria [name], female    DOB: 10-Jul-1930, 77 y.o.   MRN: [id number]  HPI Patient seen for the following issues  3-4 days history of urine frequency. No burning. Frequent UTIs in the past. Denies any fever, chills, nausea, vomiting, or diarrhea. No significant caffeine use. Occasional nocturia. Some urinary urgency.  Pruritic rash perianal region noted last week. Tried Vaseline and Neosporin without improvement. No recent change in bowel habits. No alleviating factors.   Review of Systems  Constitutional: Negative for fever and chills.  Respiratory: Negative for cough and shortness of breath.   Cardiovascular: Negative for chest pain, palpitations and leg swelling.  Gastrointestinal: Negative for diarrhea and blood in stool.  Genitourinary: Positive for frequency. Negative for dysuria, hematuria, decreased urine volume and vaginal discharge.  Neurological: Negative for dizziness.  Hematological: Negative for adenopathy.       Objective:   Physical Exam  Constitutional: She appears well-developed and well-nourished.  Cardiovascular: Normal rate and regular rhythm.   Pulmonary/Chest: Effort normal and breath sounds normal. She has no wheezes. She has no rales.  Musculoskeletal: She exhibits no edema.  Skin:       Nonspecific mild erythema around the perianal region. Couple excoriations. No pustules. No vesicles. No anal fissure. Nontender to palpation. No warmth.          Assessment & Plan:  #1 urine frequency. No evidence for UTI on dipstick. Reduction in caffeine. May have some mild urgency. Observe for now #2 mild perianal dermatitis. Triamcinolone cream twice daily and touch base 2 weeks if no better

## 2010-11-18 ENCOUNTER — Encounter: Payer: Self-pay | Admitting: Family Medicine

## 2010-11-18 ENCOUNTER — Ambulatory Visit (INDEPENDENT_AMBULATORY_CARE_PROVIDER_SITE_OTHER): Payer: Medicare Other | Admitting: Family Medicine

## 2010-11-18 VITALS — BP 130/70 | Temp 97.8°F | Wt 131.0 lb

## 2010-11-18 DIAGNOSIS — L03319 Cellulitis of trunk, unspecified: Secondary | ICD-10-CM

## 2010-11-18 DIAGNOSIS — L02215 Cutaneous abscess of perineum: Secondary | ICD-10-CM

## 2010-11-18 NOTE — Patient Instructions (Signed)
Continue warm soaks or warm compresses once or twice daily. Follow up promptly for any fever or increased swelling

## 2010-11-18 NOTE — Progress Notes (Signed)
  Subjective:    Patient ID: Gloria [name], female    DOB: 09-Sep-1929, 77 y.o.   MRN: [id number]  HPI Patient is seen with swollen area perineum which she first noticed Sunday. This started spontaneously draining couple days ago. Has been using warm compresses daily. Essentially nonpainful. No fever or chills. No history of similar skin lesion in this region previously.   Review of Systems  Constitutional: Negative for fever, chills, activity change and unexpected weight change.       Objective:   Physical Exam  Constitutional: She appears well-developed and well-nourished.  Cardiovascular: Normal rate, regular rhythm and normal heart sounds.   Pulmonary/Chest: No respiratory distress. She has no wheezes. She has no rales.  Skin:       In the perineal area just anterior to the anus and just to the right side she has area of erythema approximately 1 cm diameter. She has central area of exudate with no active purulent drainage. Nontender to palpation. No necrosis.          Assessment & Plan:  Probable spontaneous draining abscess perineum. This appears to be resolving. Continue warm soaks and reassess 2 weeks to make sure this is  resolving fully.  No cellulitis changes at this time.

## 2010-12-01 ENCOUNTER — Encounter: Payer: Self-pay | Admitting: Family Medicine

## 2010-12-01 ENCOUNTER — Ambulatory Visit (INDEPENDENT_AMBULATORY_CARE_PROVIDER_SITE_OTHER): Payer: Medicare Other | Admitting: Family Medicine

## 2010-12-01 VITALS — BP 140/78 | Temp 98.8°F | Wt 130.0 lb

## 2010-12-01 DIAGNOSIS — L089 Local infection of the skin and subcutaneous tissue, unspecified: Secondary | ICD-10-CM

## 2010-12-01 MED ORDER — DOXYCYCLINE HYCLATE 100 MG PO CAPS: 100.0000 mg | ORAL_CAPSULE | Freq: Two times a day (BID) | ORAL | Status: DC

## 2010-12-01 NOTE — Patient Instructions (Signed)
Use antibacterial soap such as Lever 2000  or dial.  Use warm compresses to the pustular area twice daily

## 2010-12-01 NOTE — Progress Notes (Signed)
  Subjective:    Patient ID: Gloria [name], female    DOB: [birth year], 77 y.o.   MRN: [id number]  HPI Patient here for followup recent spontaneously draining abscess perineum. No further drainage. Has some induration. No pain. Has another pruritic rash which she just noticed over the weekend pelvic area. She wondered if secondary to bug bite. No fever or chills.  History of osteoporosis. Takes calcium and vitamin D. She has been intolerant of bisphosphonates. She has refused other treatments such as Reclast or Prolia.   Review of Systems  Constitutional: Negative for fever and chills.       Objective:   Physical Exam  Constitutional: She appears well-developed and well-nourished.  Cardiovascular: Normal rate, regular rhythm and normal heart sounds.   Pulmonary/Chest: Effort normal and breath sounds normal. No respiratory distress. She has no wheezes. She has no rales.  Skin:       Patient has small area of induration as previously noted but erythema and drainage have fully cleared. She has small pustule right labia majora region with no induration no fluctuance          Assessment & Plan:  #1 recent perineal abscess which has drained spontaneously and is cleared at this point no further treatment necessary #2 small pustule right labia majora region. Question staph.   No cellulitis changes.  Try antibacterial soap and doxycycline 100 mg twice a day for 10 days

## 2010-12-14 NOTE — Assessment & Plan Note (Signed)
 HEALTHCARE                         GASTROENTEROLOGY OFFICE NOTE   NAME:KNIGHTKaitlan, Bin                        MRN:          [id number]  DATE:03/01/2007                            DOB:          [birth year]    Alejandro continues with some indigestion despite taking Protonix.  She is  also describing periodic solid food dysphagia.  Her last endoscopy and  dilation was in September of 2007.  She had a peptic stricture at that  time.  Biopsies did not show evidence of Barrett's mucosa or  eosinophilic esophagitis.  She has had previous evidence of NSAID injury  to her gut.  She is up to date on her colonoscopy and denies significant  lower GI problems at this time.   Vital signs were all normal.  General Physical Exam was not repeated.   RECOMMENDATIONS:  1. Continue Protonix 40 mg twice a day.  2. Outpatient endoscopy and dilatation.  3. Check screening laboratory parameters.  The patient apparently is      on B12 replacement therapy.  Does have a history of chronic anemia      with high iron levels.  Previous B12 level in January of 2006 was      274.     Vania Rea. Jarold Motto, MD, Caleen Essex, FAGA  Electronically Signed    DRP/MedQ  DD: 03/01/2007  DT: 03/01/2007  Job #: [id number]

## 2010-12-14 NOTE — Letter (Signed)
May 18, 2007    [facility]   RE:  BRECK, HOLLINGER  MRN:  [id number]  /  DOB:  May 01, 1930   Dear Londell Moh:   I would appreciate it if you could see Danyka Merlin for her chronic,  normochromic, normocytic anemia, which apparently has been a problem for  at least 15 years.  She has seen a physician in your group, but she  cannot recall his name.  She had bone marrow exam some 15 years ago.  She continues to be anemic without any specific etiology.  Recent  laboratory data showed a 30% iron saturation with a serum ferritin of  760, normal folate and B12 levels, negative anti-parietal antibodies,  negative anti-intrinsic factor antibodies, and a hemoglobin of 9.9,  hematocrit of 29, MCV 89, MCHC 34, and normal white cell and platelet  counts.   I recently did an endoscopy on Mrs. Martine because of some dysphagia.  She had a benign peptic stricture of her esophagus that was dilated.  She was placed on Carafate therapy since she has had no response to past  PPIs.  She is currently asymptomatic without abdominal pain or  dysphagia.   I would appreciate it if you could see Mrs. Sanford for a possible repeat  bone marrow examination.  I have had her discontinue her iron  supplements, and she no longer is on B12 injections, which she  apparently took in the past.    Sincerely,      Vania Rea. Jarold Motto, MD, Caleen Essex, FAGA  Electronically Signed    DRP/MedQ  DD: 05/18/2007  DT: 05/19/2007  Job #: [id number]   CC:    Franchot Heidelberg, M.D.

## 2010-12-17 NOTE — Op Note (Signed)
[name], Gloria [name]                 ACCOUNT NO.:  [id number]   MEDICAL RECORD NO.:  [id number]          PATIENT TYPE:  AMB   LOCATION:  ENDO                         FACILITY:  [facility]   PHYSICIAN:  Vania Rea. Jarold Motto, M.D. [facility] OF BIRTH:  1930/07/10   DATE OF PROCEDURE:  01/20/2005  DATE OF DISCHARGE:  01/20/2005                                 OPERATIVE REPORT   ESOPHAGEAL MANOMETRY:  Esophageal manometry completed as is follows:   1.  Upper esophageal sphincter.  There is not enough data on this trace for      me to review to comment on upper esophageal sphincter.  2.  Lower esophageal sphincter.  Again, this trace is of poor quality      because there is no baseline recording of the lower esophageal      sphincter.  It appears to be incompetent at approximately 10-15 mmHg.      It is hard to comment on whether there is adequate relaxation on      swallowing.  3.  Motility pattern.  There are normally propagated peristaltic waves      throughout the length of the esophagus on wet and dry swallows.  There      is 100% peristalsis, mean amplitude and contraction is 120 mmHg.   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  There is no evidence of an esophageal motility  disorder.        DRP/MEDQ  D:  02/08/2005  T:  02/08/2005  Job:  [id number]

## 2010-12-17 NOTE — Op Note (Signed)
NAMESHARION, GRIEVES                 ACCOUNT NO.:  [id number]   MEDICAL RECORD NO.:  [id number]          PATIENT TYPE:  AMB   LOCATION:  ENDO                         FACILITY:  [facility]   PHYSICIAN:  Vania Rea. Jarold Motto, M.D. [facility] OF BIRTH:  06-19-1930   DATE OF PROCEDURE:  01/20/2005  DATE OF DISCHARGE:  01/20/2005                                 OPERATIVE REPORT   24-HOUR PH PROBE STUDY REPORT:  This study was completed on January 21, 2005.  It is a very unimpressive study  for acid reflux findings.  There is no significant proximal acid reflux.  Distally, there is no acid reflux in upright position, but a slightly  increased distal acid reflux pattern is noted in the recumbent position, but  the percent of time with the pH of less than 4, being 4.5%, normal less than  1.2%.  Total DeMeester score is 23.9, normal less than 22.   ASSESSMENT:  On review of this report, I am not sure it is somewhat  __________in the long period of acid reflux at night appears to be perhaps  positional in nature.   RECOMMENDATIONS:  On reviewing the study, the patient would do well with PPI  30 minutes before supper at night which should care of her nocturnal acid  reflux.        DRP/MEDQ  D:  02/08/2005  T:  02/08/2005  Job:  [id number]

## 2010-12-17 NOTE — Assessment & Plan Note (Signed)
[facility]                         GASTROENTEROLOGY OFFICE NOTE   NAME:KNIGHTLoney, [name]                        MRN:          [id number]  DATE:11/09/2006                            DOB:          01-Aug-1930    Gloria [name] had worsening reflux symptoms and pain in her substernal area since  going to generic Protonix. She denies dysphagia. She also has rather  marked constipation, has to occasionally use laxatives. She had a  colonoscopy 3 years ago which was unremarkable except for  diverticulosis. She has had documented chronic acid reflux and had a  dilation done a year ago. Biopsies showed no evidence of eosinophilic  esophagitis. She is status post cholecystectomy. She has had no anorexia  or weight loss, in fact is gaining weight.   She weighs 142 pounds. Her blood pressure is 122/78. Pulse was 88 and  regular.  Examination of the abdomen was unremarkable except for some tenderness  of the subxiphoid area.   RECOMMENDATIONS:  1. Change back to prescription Protonix. I have given her samples for      40 mg twice a day 30 minutes before meals with standard antireflux      maneuvers.  2. Trial of Amitiza 24 mcg twice a day with meals.  3. The patient is to call for a progress report in one week's time.     Vania Rea. Jarold Motto, MD, Caleen Essex, FAGA  Electronically Signed    DRP/MedQ  DD: 11/09/2006  DT: 11/09/2006  Job #: [id number]

## 2010-12-28 ENCOUNTER — Encounter: Payer: Self-pay | Admitting: Family Medicine

## 2010-12-28 LAB — HM MAMMOGRAPHY: HM Mammogram: NEGATIVE

## 2011-01-19 ENCOUNTER — Ambulatory Visit: Payer: Medicare Other | Admitting: Family Medicine

## 2011-01-25 ENCOUNTER — Ambulatory Visit (INDEPENDENT_AMBULATORY_CARE_PROVIDER_SITE_OTHER): Payer: Medicare Other | Admitting: Family Medicine

## 2011-01-25 ENCOUNTER — Encounter: Payer: Self-pay | Admitting: Family Medicine

## 2011-01-25 VITALS — BP 140/70 | HR 78 | Temp 97.7°F | Wt 133.0 lb

## 2011-01-25 DIAGNOSIS — R21 Rash and other nonspecific skin eruption: Secondary | ICD-10-CM

## 2011-01-25 DIAGNOSIS — M48061 Spinal stenosis, lumbar region without neurogenic claudication: Secondary | ICD-10-CM | POA: Insufficient documentation

## 2011-01-25 MED ORDER — TRIAMCINOLONE ACETONIDE 0.1 % EX CREA: TOPICAL_CREAM | Freq: Two times a day (BID) | CUTANEOUS | Status: DC

## 2011-01-25 NOTE — Progress Notes (Signed)
Subjective:    Patient ID: Gloria [name], female    DOB: 1930-07-10, 77 y.o.   MRN: [id number]  HPI Patient seen for medical followup. She has history of B12 deficiency, chronic anemia, elevated blood pressure with probable whitecoat syndrome, GERD, esophageal stricture, urinary urgency, osteoporosis, and lumbar stenosis. Currently ambulates with cane. Requesting a walker. No recent falls. She feels ambulation is limited with cane.    GERD controlled with Nexium 40 mg daily. No side effects. Remains on monthly B12 injections which her son administers at home. She has refused further treatments for osteoporosis but remains on calcium and vitamin D.  She is recurrent pruritic rash anal region relief with triamcinolone and requesting refill  Past Medical History  Diagnosis Date  . Allergy   . B12 deficiency   . Hypertension   . Atrial fibrillation 2011  . GERD (gastroesophageal reflux disease)   . Osteoporosis   . Recurrent UTI   . Esophageal stricture    Past Surgical History  Procedure Date  . Tonsillectomy   . Abdominal hysterectomy 1969    partial, no cancer  . Appendectomy   . Cholecystectomy 1999    reports that she quit smoking about 4 months ago. Her smoking use included Cigarettes. She has a 6 pack-year smoking history. She does not have any smokeless tobacco history on file. She reports that she does not drink alcohol or use illicit drugs. family history includes Cancer in her brother; Diabetes in her brother; Hypertension in her father and mother; and Leukemia in her father. Allergies  Allergen Reactions  . Caffeine     REACTION: Heart racing  . Hydromorphone Hcl   . Ibuprofen     REACTION: Decreased hearing  . Meperidine Hcl     REACTION: Severe palpatations  . Morphine     REACTION: Hallucinations  . Penicillins   . Propoxyphene Hcl     REACTION: Hallucinations  . Propoxyphene N-Acetaminophen     REACTION: Hallucinations  . Sulfamethoxazole W/Trimethoprim    REACTION: Rash      Review of Systems  Constitutional: Negative for fever, activity change, appetite change, fatigue and unexpected weight change.  Respiratory: Negative for cough and shortness of breath.   Cardiovascular: Negative for chest pain, palpitations and leg swelling.  Gastrointestinal: Negative for abdominal pain.  Genitourinary: Negative for dysuria.  Neurological: Negative for dizziness and headaches.  Hematological: Negative for adenopathy.  Psychiatric/Behavioral: Negative for dysphoric mood.       Objective:   Physical Exam  Constitutional: She is oriented to person, place, and time. She appears well-developed and well-nourished. No distress.  HENT:  Mouth/Throat: Oropharynx is clear and moist.  Neck: Neck supple. No thyromegaly present.  Cardiovascular: Normal rate and regular rhythm.   Pulmonary/Chest: Effort normal and breath sounds normal. No respiratory distress. She has no wheezes. She has no rales.  Musculoskeletal:       Trace nonpitting edema legs bilaterally  Neurological: She is alert and oriented to person, place, and time. No cranial nerve deficit.  Skin: No rash noted.  Psychiatric: She has a normal mood and affect. Her behavior is normal.          Assessment & Plan:  #1 lumbar stenosis. Patient requesting prescription for walker and this is reasonable. She feels this will increase her ambulation. Prescription written #2 GERD stable. Continue Nexium 40 mg daily #3 recurrent rash. Nonspecific dermatitis. Refill triamcinolone  #4 osteoporosis. Emphasize importance of weight-bearing exercise both calcium and vitamin D. Patient  reluctant to consider other osteoporosis therapies

## 2011-02-11 ENCOUNTER — Telehealth: Payer: Self-pay | Admitting: *Deleted

## 2011-02-11 DIAGNOSIS — L089 Local infection of the skin and subcutaneous tissue, unspecified: Secondary | ICD-10-CM

## 2011-02-11 MED ORDER — DOXYCYCLINE HYCLATE 100 MG PO CAPS: 100.0000 mg | ORAL_CAPSULE | Freq: Two times a day (BID) | ORAL | Status: AC

## 2011-02-11 NOTE — Telephone Encounter (Signed)
OK to refill Doxycycline 100 mg po bid for 10d and needs f/u to reassess if no better.

## 2011-02-11 NOTE — Telephone Encounter (Signed)
Itchy rash on bottom x3-4 days.  Dr Caryl Never gave her doxycycline before and would like a rx called into [facility]

## 2011-02-11 NOTE — Telephone Encounter (Signed)
Please advise 

## 2011-02-11 NOTE — Telephone Encounter (Signed)
Pt informed

## 2011-03-01 ENCOUNTER — Ambulatory Visit (INDEPENDENT_AMBULATORY_CARE_PROVIDER_SITE_OTHER): Payer: Medicare Other | Admitting: Family Medicine

## 2011-03-01 ENCOUNTER — Encounter: Payer: Self-pay | Admitting: Family Medicine

## 2011-03-01 VITALS — BP 160/88 | Temp 98.3°F | Wt 136.0 lb

## 2011-03-01 DIAGNOSIS — M79672 Pain in left foot: Secondary | ICD-10-CM

## 2011-03-01 DIAGNOSIS — M79609 Pain in unspecified limb: Secondary | ICD-10-CM

## 2011-03-01 MED ORDER — TRAMADOL HCL 50 MG PO TABS: 50.0000 mg | ORAL_TABLET | Freq: Four times a day (QID) | ORAL | Status: AC | PRN

## 2011-03-01 NOTE — Patient Instructions (Signed)
Continue to monitor home blood pressure and be in touch if > 140/90

## 2011-03-01 NOTE — Progress Notes (Signed)
  Subjective:    Patient ID: Gloria [name], female    DOB: 1930-02-11, 77 y.o.   MRN: [id number]  HPI Patient seen for left foot pain. No injury. Chronic intermittent foot pain for several years. Has seen podiatrist previously and apparently has had several injections. Location is anterior and inferior to lateral malleolus. No swelling or ecchymosis. No erythema or warmth. No history of gout. Pain worse with ambulation.  Tylenol without relief. Intolerant of several medications   Review of Systems  Cardiovascular: Negative for leg swelling.  Musculoskeletal: Negative for myalgias and back pain.  Skin: Negative for rash.       Objective:   Physical Exam  Constitutional: She appears well-developed and well-nourished.  Cardiovascular: Normal rate, regular rhythm and normal heart sounds.   Pulmonary/Chest: Effort normal and breath sounds normal. No respiratory distress. She has no wheezes. She has no rales.  Musculoskeletal:       Left foot reveals no warmth or erythema. Full range of motion left ankle. No localized bony tenderness. Achilles intact. Full strength with plantar flexion dorsiflexion. Good distal foot pulses          Assessment & Plan:  Left ankle/foot pain.  Question tendinitis. Patient requesting cortisone injection. Expressed reluctance -specifically concern for weakening tendons in this region. She'll followup with podiatrist. Trial of tramadol 50 mg every 6 hours when necessary and icing. No problem-specific assessment & plan notes found for this encounter.

## 2011-04-25 ENCOUNTER — Ambulatory Visit (INDEPENDENT_AMBULATORY_CARE_PROVIDER_SITE_OTHER): Payer: Medicare Other | Admitting: Family Medicine

## 2011-04-25 ENCOUNTER — Encounter: Payer: Self-pay | Admitting: Family Medicine

## 2011-04-25 DIAGNOSIS — M546 Pain in thoracic spine: Secondary | ICD-10-CM

## 2011-04-25 DIAGNOSIS — I10 Essential (primary) hypertension: Secondary | ICD-10-CM

## 2011-04-25 MED ORDER — CYCLOBENZAPRINE HCL 5 MG PO TABS: 5.0000 mg | ORAL_TABLET | Freq: Three times a day (TID) | ORAL | Status: DC | PRN

## 2011-04-25 MED ORDER — LOSARTAN POTASSIUM 50 MG PO TABS: 50.0000 mg | ORAL_TABLET | Freq: Every day | ORAL | Status: DC

## 2011-04-25 NOTE — Progress Notes (Signed)
Subjective:    Patient ID: Gloria [name], female    DOB: 1930/06/15, 77 y.o.   MRN: [id number]  HPI Patient seen with back pain mostly mid thoracic area. Onset last Wednesday after picking up case of water bottles. Did not recall any sudden pain with lifting. Pain is somewhat poorly localized. She went to urgent care Sunday and was instructed to use heat and Tylenol. Heat seems to make her pain worse. Tylenol helps somewhat. Pain is worse with movement. Moderate severity. Denies any cough, fever, chills, pleuritic pain, or dyspnea.  Patient called EMS last night secondary to pain and blood pressure was 210/110. She has previously been on blood pressure medications including losartan and amlodipine but apparently these were both discontinued because of subsequent low blood pressure. She denies any recent headache. No chest pain.  Past Medical History  Diagnosis Date  . Allergy   . B12 deficiency   . Hypertension   . Atrial fibrillation 2011  . GERD (gastroesophageal reflux disease)   . Osteoporosis   . Recurrent UTI   . Esophageal stricture    Past Surgical History  Procedure Date  . Tonsillectomy   . Abdominal hysterectomy 1969    partial, no cancer  . Appendectomy   . Cholecystectomy 1999    reports that she quit smoking about 7 months ago. Her smoking use included Cigarettes. She has a 6 pack-year smoking history. She does not have any smokeless tobacco history on file. She reports that she does not drink alcohol or use illicit drugs. family history includes Cancer in her brother; Diabetes in her brother; Hypertension in her father and mother; and Leukemia in her father. Allergies  Allergen Reactions  . Caffeine     REACTION: Heart racing  . Hydromorphone Hcl   . Ibuprofen     REACTION: Decreased hearing  . Meperidine Hcl     REACTION: Severe palpatations  . Morphine     REACTION: Hallucinations  . Penicillins   . Propoxyphene Hcl     REACTION: Hallucinations  .  Propoxyphene N-Acetaminophen     REACTION: Hallucinations  . Sulfamethoxazole W/Trimethoprim     REACTION: Rash      Review of Systems  Constitutional: Negative for fever, chills, fatigue and unexpected weight change.  Respiratory: Negative for cough and shortness of breath.   Cardiovascular: Negative for chest pain.  Gastrointestinal: Negative for abdominal pain.  Genitourinary: Negative for dysuria.  Musculoskeletal: Positive for back pain. Negative for arthralgias.  Skin: Negative for rash.  Neurological: Negative for dizziness, syncope and headaches.       Objective:   Physical Exam  Constitutional: She is oriented to person, place, and time. She appears well-developed and well-nourished.  Neck: Neck supple.  Cardiovascular: Normal rate and regular rhythm.   Pulmonary/Chest: Effort normal and breath sounds normal. No respiratory distress. She has no wheezes. She has no rales.  Musculoskeletal:       No thoracic spine tenderness. She has some parathoracic muscular tenderness and tension to palpation  Neurological: She is alert and oriented to person, place, and time. No cranial nerve deficit.  Skin: No rash noted.  Psychiatric: She has a normal mood and affect. Her behavior is normal.          Assessment & Plan:  #1 thoracic back pain. Low suspicion for compression fracture. Appears to be more muscular.  Trial low-dose cyclobenzaprine 5 mg and cautioned about potential side effects. Reassess one week #2 hypertension. Currently untreated. Start back losartan  50 mg daily and reassess one week

## 2011-04-25 NOTE — Patient Instructions (Signed)
Start back losartan 50 mg one daily Take cyclobenzaprine as needed for back pain and may continue with Tylenol  Watch out for potential side effects with cyclobenzaprine such as sedation or constipation Followup sooner for any shortness of breath or chest pain

## 2011-04-26 ENCOUNTER — Other Ambulatory Visit: Payer: Self-pay | Admitting: Family Medicine

## 2011-04-26 NOTE — Telephone Encounter (Signed)
Flexeril 5 mg comes in generic and should be very inexpensive.  Even out of pocket should not be too bad for this med.  Let's see what issue is with getting this filled.  Does she know of another muscle relaxer her insurance would cover?

## 2011-04-26 NOTE — Telephone Encounter (Signed)
Pt did not get flexeril due to Ins problem

## 2011-04-26 NOTE — Telephone Encounter (Signed)
We have not had her on this previously.  I would NOT recommend mixing with the Flexeril we gave yesterday.

## 2011-04-26 NOTE — Telephone Encounter (Signed)
Pt is requesting new rx klonopine 0.5mg  call into [facility] [phone]. Pt was seen yesterday for pinched nerve.

## 2011-04-27 ENCOUNTER — Ambulatory Visit: Payer: Medicare Other | Admitting: Family Medicine

## 2011-04-27 MED ORDER — CLONAZEPAM 0.5 MG PO TABS: ORAL_TABLET | ORAL | Status: DC

## 2011-04-27 NOTE — Telephone Encounter (Signed)
Klonopin is not a muscle relaxer and is not indicated for back pain.  Does she want to try another muscle relaxer (other than Flexeril)?

## 2011-04-27 NOTE — Telephone Encounter (Signed)
Per Dr Caryl Never, OK to try Klonopin 0.5, 1/2 to 1 tab at bedtime prn  #30 with 1 refill

## 2011-04-27 NOTE — Telephone Encounter (Signed)
Pt informed, she took 1/4 of her daughter's with good relief

## 2011-04-27 NOTE — Telephone Encounter (Signed)
Pt does not want flexeril at all. Pt wants  klonopine 0.5mg .

## 2011-04-29 ENCOUNTER — Telehealth: Payer: Self-pay

## 2011-04-29 NOTE — Telephone Encounter (Signed)
Pt called and stated that she was having chest pains;  Upon getting on the telephone pt stated she thought she was having some pain from her hital hernia or a pulled muscle.  Pt stated she did not have nausea or vomiting.  Pt aware that Dr. Caryl Never left early  On 04/29/11. Offered pt appt with another physician, urgent care or ER. Pt declined and stated she had an appt for Oct. 1, 2012 and she would wait until then.

## 2011-05-02 ENCOUNTER — Encounter: Payer: Self-pay | Admitting: Family Medicine

## 2011-05-02 ENCOUNTER — Ambulatory Visit (INDEPENDENT_AMBULATORY_CARE_PROVIDER_SITE_OTHER): Payer: Medicare Other | Admitting: Family Medicine

## 2011-05-02 DIAGNOSIS — M546 Pain in thoracic spine: Secondary | ICD-10-CM

## 2011-05-02 DIAGNOSIS — I10 Essential (primary) hypertension: Secondary | ICD-10-CM

## 2011-05-02 NOTE — Progress Notes (Signed)
  Subjective:    Patient ID: Gloria [name], female    DOB: 1929/10/14, 77 y.o.   MRN: [id number]  HPI Followup hypertension and thoracic back pain. We started back losartan 50 mg daily just tolerated well. Compliant with therapy. Not checking blood pressures at home. Pain mid thoracic region and left of the spine. She's had pain over the weekend- some L periscapular . No chest pain. Denies dyspnea or cough. No pleuritic pain. Denies fever or chills. No skin rash.  Prescribed Flexeril but she had concern for possible side effects. She never started this. Did start low-dose lorazepam 0.5 mg at night and she is sleeping much better and feels this has helped her muscle tension. Thoracic back pain is about 50% improved.   Review of Systems  Constitutional: Negative for fever and chills.  Respiratory: Negative for cough, shortness of breath and wheezing.   Cardiovascular: Negative for chest pain, palpitations and leg swelling.  Gastrointestinal: Negative for abdominal pain.  Musculoskeletal: Positive for back pain.  Skin: Negative for rash.  Neurological: Negative for dizziness and headaches.       Objective:   Physical Exam  Constitutional: She appears well-developed and well-nourished. No distress.  HENT:  Right Ear: External ear normal.  Left Ear: External ear normal.  Mouth/Throat: Oropharynx is clear and moist.  Neck: Neck supple.  Cardiovascular: Normal rate and regular rhythm.   Pulmonary/Chest: Effort normal and breath sounds normal. No respiratory distress. She has no wheezes. She has no rales.  Musculoskeletal: She exhibits no edema.       No thoracic spine tenderness. She has some muscular tenderness just below the left scapular muscle. No skin rash. No visible swelling.  Skin: No rash noted.          Assessment & Plan:  #1 hypertension. Improved but not quite to goal. Reassess in one month and titrate losartan then if necessary #2 thoracic back pain. Improved. Suspect  muscular. Continue heat and nighttime use of Klonopin.

## 2011-05-02 NOTE — Telephone Encounter (Signed)
Pt here for OV Monday.

## 2011-05-02 NOTE — Patient Instructions (Signed)
Monitor blood pressure and record readings and bring back next visit.

## 2011-05-04 LAB — CBC
HCT: 28 — ABNORMAL LOW
Hemoglobin: 9.3 — ABNORMAL LOW
MCHC: 33.4
MCV: 89.9
Platelets: 182
RBC: 3.11 — ABNORMAL LOW
RDW: 13.3
WBC: 4.7

## 2011-05-04 LAB — POCT URINALYSIS DIP (DEVICE)
Bilirubin Urine: NEGATIVE
Hgb urine dipstick: NEGATIVE
Nitrite: NEGATIVE
Protein, ur: NEGATIVE
pH: 5.5

## 2011-05-04 LAB — OCCULT BLOOD X 1 CARD TO LAB, STOOL: Fecal Occult Bld: NEGATIVE

## 2011-05-04 LAB — LACTIC ACID, PLASMA: Lactic Acid, Venous: 0.6

## 2011-05-04 LAB — POCT I-STAT, CHEM 8
Calcium, Ion: 1.2
Glucose, Bld: 106 — ABNORMAL HIGH
HCT: 29 — ABNORMAL LOW
Hemoglobin: 9.9 — ABNORMAL LOW
Potassium: 3.8

## 2011-05-23 ENCOUNTER — Other Ambulatory Visit: Payer: Self-pay | Admitting: Family Medicine

## 2011-05-23 MED ORDER — POLYETHYLENE GLYCOL 3350 17 GM/SCOOP PO POWD: 17.0000 g | Freq: Every day | ORAL | Status: AC | PRN

## 2011-05-23 NOTE — Telephone Encounter (Signed)
Pt is req refill of Polyethylene Glycol [facility] [place]. Pt said that this med is used to help boweles move.

## 2011-05-23 NOTE — Telephone Encounter (Signed)
May refill once. 

## 2011-05-23 NOTE — Telephone Encounter (Signed)
I do not see in Epic that this has ever been on her med list?

## 2011-06-03 ENCOUNTER — Encounter: Payer: Self-pay | Admitting: Family Medicine

## 2011-06-03 ENCOUNTER — Ambulatory Visit (INDEPENDENT_AMBULATORY_CARE_PROVIDER_SITE_OTHER): Payer: Medicare Other | Admitting: Family Medicine

## 2011-06-03 VITALS — BP 150/80 | Temp 97.8°F | Wt 133.0 lb

## 2011-06-03 DIAGNOSIS — R1013 Epigastric pain: Secondary | ICD-10-CM

## 2011-06-03 LAB — CBC WITH DIFFERENTIAL/PLATELET
Eosinophils Absolute: 0.1 10*3/uL (ref 0.0–0.7)
MCHC: 34.2 g/dL (ref 30.0–36.0)
MCV: 90.1 fl (ref 78.0–100.0)
Monocytes Absolute: 0.4 10*3/uL (ref 0.1–1.0)
Neutrophils Relative %: 66.5 % (ref 43.0–77.0)
Platelets: 181 10*3/uL (ref 150.0–400.0)
WBC: 4.6 10*3/uL (ref 4.5–10.5)

## 2011-06-03 LAB — LIPASE: Lipase: 62 U/L — ABNORMAL HIGH (ref 11.0–59.0)

## 2011-06-03 MED ORDER — CALCITONIN (SALMON) 200 UNIT/ACT NA SOLN: 1.0000 | Freq: Every day | NASAL | Status: DC

## 2011-06-03 MED ORDER — ESOMEPRAZOLE MAGNESIUM 40 MG PO CPDR: 40.0000 mg | DELAYED_RELEASE_CAPSULE | Freq: Two times a day (BID) | ORAL | Status: DC

## 2011-06-03 NOTE — Patient Instructions (Signed)
Add Pepcid to your Nexium for the next week. Touch base by early next week if no better.

## 2011-06-03 NOTE — Progress Notes (Signed)
  Subjective:    Patient ID: Gloria [name], female    DOB: [birth year], 77 y.o.   MRN: [id number]  HPI  Acute visit. Two-week history of epigastric abdominal pain. Occasional radiation to the left side. She had relatively constant symptoms but better last night. Burning quality. Denies any associated nausea, vomiting, diarrhea, or appetite or weight changes. She has black stools and taking iron but no true melena. Takes Nexium twice daily. History of gastritis on EGD August of 2011.  Neegative Helicobacter Pylori that time. Denies chest pain. Pain 7/10 intensity at worst. No alleviating factors. Symptoms worse at night. No relation to eating. Prior history of appendectomy and cholecystectomy. No exertional symptoms.  Past Medical History  Diagnosis Date  . Allergy   . B12 deficiency   . Hypertension   . Atrial fibrillation 2011  . GERD (gastroesophageal reflux disease)   . Osteoporosis   . Recurrent UTI   . Esophageal stricture    Past Surgical History  Procedure Date  . Tonsillectomy   . Abdominal hysterectomy 1969    partial, no cancer  . Appendectomy   . Cholecystectomy 1999    reports that she quit smoking about 8 months ago. Her smoking use included Cigarettes. She has a 6 pack-year smoking history. She does not have any smokeless tobacco history on file. She reports that she does not drink alcohol or use illicit drugs. family history includes Cancer in her brother; Diabetes in her brother; Hypertension in her father and mother; and Leukemia in her father. Allergies  Allergen Reactions  . Caffeine     REACTION: Heart racing  . Hydromorphone Hcl   . Ibuprofen     REACTION: Decreased hearing  . Meperidine Hcl     REACTION: Severe palpatations  . Morphine     REACTION: Hallucinations  . Penicillins   . Propoxyphene Hcl     REACTION: Hallucinations  . Propoxyphene N-Acetaminophen     REACTION: Hallucinations  . Sulfamethoxazole W/Trimethoprim     REACTION: Rash       Review of Systems  Constitutional: Negative for fever, chills, appetite change and unexpected weight change.  Respiratory: Negative for cough and shortness of breath.   Cardiovascular: Negative for chest pain.  Gastrointestinal: Positive for abdominal pain. Negative for nausea, vomiting, diarrhea, constipation and blood in stool.  Genitourinary: Negative for dysuria.       Objective:   Physical Exam  Constitutional: She appears well-developed and well-nourished.  Cardiovascular: Normal rate, regular rhythm and normal heart sounds.   Pulmonary/Chest: Effort normal and breath sounds normal. No respiratory distress. She has no wheezes. She has no rales.  Abdominal: Soft. Bowel sounds are normal. She exhibits no distension and no mass. There is no rebound and no guarding.       Minimal tenderness epigastric region otherwise normal exam          Assessment & Plan:  Epigastric abdominal pain. Etiology unclear at this time. She denied associated nausea or vomiting. Past history of gastritis. No significant risk factors for recurrent gastritis. Check CBC and lipase. Supplement with Pepcid once or twice daily and consider upper GI done next week if no further improvements

## 2011-06-04 ENCOUNTER — Other Ambulatory Visit: Payer: Self-pay | Admitting: Family Medicine

## 2011-06-06 ENCOUNTER — Ambulatory Visit: Payer: Medicare Other | Admitting: Family Medicine

## 2011-06-07 NOTE — Telephone Encounter (Signed)
This had been sent to [facility] - new order request to [facility]

## 2011-06-07 NOTE — Progress Notes (Signed)
Quick Note:  Pt informed ______ 

## 2011-06-10 ENCOUNTER — Ambulatory Visit (INDEPENDENT_AMBULATORY_CARE_PROVIDER_SITE_OTHER): Payer: Medicare Other | Admitting: Family Medicine

## 2011-06-10 ENCOUNTER — Encounter: Payer: Self-pay | Admitting: Family Medicine

## 2011-06-10 VITALS — BP 140/80 | Temp 98.1°F | Wt 131.0 lb

## 2011-06-10 DIAGNOSIS — R1013 Epigastric pain: Secondary | ICD-10-CM

## 2011-06-10 NOTE — Progress Notes (Signed)
  Subjective:    Patient ID: Gloria [name], female    DOB: 1930-05-09, 77 y.o.   MRN: [id number]  HPI  Patient seen with abdominal pains epigastric region. Refer to prior note. She has prior history of gastritis. She takes Nexium 40 mg twice daily and supplement with Pepcid. She is about 20% improved-since last visit. Symptoms wax and wane. She has been very carefull with bland diet. She has burning sensation with some foods and water. Location is epigastric. No significant radiation. No vomiting. No melena. No hematemesis. No weight changes. Recent labs hemoglobin stable 8.9 with lipase only minimally elevated at 62. Low clinical suspicion for pancreatitis.  She has seen gastroenterologist previously with previous endoscopy August 2011. Previous helicobacter pylori testing negative.  Review of Systems  Constitutional: Negative for fever, appetite change and unexpected weight change.  HENT: Negative for sore throat and trouble swallowing.   Respiratory: Negative for shortness of breath.   Cardiovascular: Negative for chest pain.  Gastrointestinal: Positive for abdominal pain. Negative for nausea, vomiting, diarrhea and constipation.  Genitourinary: Negative for dysuria.  Neurological: Negative for dizziness.  Psychiatric/Behavioral: Negative for dysphoric mood.       Objective:   Physical Exam  Constitutional: She appears well-developed and well-nourished.  HENT:  Mouth/Throat: Oropharynx is clear and moist.  Neck: Neck supple. No thyromegaly present.  Cardiovascular: Normal rate and regular rhythm.   Pulmonary/Chest: Effort normal and breath sounds normal. No respiratory distress. She has no wheezes. She has no rales.  Abdominal: Soft. She exhibits no distension and no mass. There is no guarding.       Minimal tenderness epigastric region  Lymphadenopathy:    She has no cervical adenopathy.          Assessment & Plan:  Persistent epigastric pain in a patient with history of  gastritis, GERD, esophageal stricture. She has no dysphagia this time. She is maintained on high-dose proton pump inhibitor. Referral back to her gastroenterologist for their opinion.

## 2011-06-14 ENCOUNTER — Ambulatory Visit (INDEPENDENT_AMBULATORY_CARE_PROVIDER_SITE_OTHER): Payer: Medicare Other | Admitting: Urgent Care

## 2011-06-14 ENCOUNTER — Other Ambulatory Visit: Payer: Self-pay | Admitting: Urgent Care

## 2011-06-14 ENCOUNTER — Encounter: Payer: Self-pay | Admitting: Internal Medicine

## 2011-06-14 DIAGNOSIS — K219 Gastro-esophageal reflux disease without esophagitis: Secondary | ICD-10-CM

## 2011-06-14 DIAGNOSIS — R1013 Epigastric pain: Secondary | ICD-10-CM

## 2011-06-14 MED ORDER — DEXLANSOPRAZOLE 60 MG PO CPDR: 60.0000 mg | DELAYED_RELEASE_CAPSULE | Freq: Every day | ORAL | Status: DC

## 2011-06-14 NOTE — Patient Instructions (Signed)
Go directly to get your lab work today If you have severe pain, you will need to go directly to the emergency room Stop Nexium Start Dexilant 60 mg daily Try to stay on clear liquid diet for the next 24 hours

## 2011-06-14 NOTE — Progress Notes (Signed)
Referring Provider: Kristian Covey, MD Primary Care Physician:  Kristian Covey, MD Primary Gastroenterologist:  Dr. Jena Gauss  Chief Complaint  Patient presents with  . Abdominal Pain    burning  . Gastrophageal Reflux   HPI:  Gloria [name] is a 77 y.o. female here for follow up for epigastric pain, radiates around to ribcage x 2 weeks.  Hx chronic dyspepsia & epigastric pain, along w/ GERD.  Evaluated 04/2011, but did not return to compete evaluation as she developed pneumonia & was lost in follow-up.  More recently, she had an elevated lipase (mild).  She has been on Nexium 40mg  BID.  Not skipping any doses.  Taking mylanta & tylenol prn.  Pain 5-7/10 on scale.  "Burning"  Denies n/v.  Denies fever or chills.  Afraid to eat due to pain worse w/ eating.  Denies dysphagia or odynophagia.  BM constipated.  BM q 3-4 days.  Feels like she would feel better if she had BM.  No weakness w/ pain. Hx severe DDD & lumbar stenosis.  Hx T5 compression.  Denies rectal bleeding or melena.  Chronic normocytic anemia w/ B12 deficiency.  Past Medical History  Diagnosis Date  . Allergy   . B12 deficiency   . Hypertension   . Atrial fibrillation 2011  . GERD (gastroesophageal reflux disease)   . Osteoporosis   . Recurrent UTI   . Esophageal stricture   . Arthritis   . Diverticulosis   . Normocytic anemia   . Chronic epigastric pain     EM Dr. Jarold Motto 2006, low LES pressure, high Demeester, unclear if pt on suppression  . Lumbar stenosis   . Chronic anemia     without evidence of iron deficiency in 2005/2008  . Degenerative disc disease     Past Surgical History  Procedure Date  . Tonsillectomy   . Abdominal hysterectomy 1969    partial, no cancer  . Appendectomy   . Cholecystectomy 1999  . Esophagogastroduodenoscopy 03/19/10    noncritical appearing Schatzki's ring/small hiatal hernia, adenomatous-appearing mucosa-bx benign  . Esophagogastroduodenoscopy 06/04/09    Dr Cherylynn Ridges  .  Colonoscopy 2005    reported Dr Doretha Imus    Current Outpatient Prescriptions  Medication Sig Dispense Refill  . calcitonin, salmon, (MIACALCIN/FORTICAL) 200 UNIT/ACT nasal spray USE ONE SPRAY ALTERNATING NOSTRILS DAILY  3.7 mL  5  . calcium-vitamin D 250-100 MG-UNIT per tablet Take 1 tablet by mouth 2 (two) times daily.        . Cholecalciferol (VITAMIN D3) 3000 UNITS TABS Take by mouth daily.        . clonazePAM (KLONOPIN) 0.5 MG tablet May use 1/2 to 1 tab at bedtime as needed  30 tablet  1  . cyanocobalamin (,VITAMIN B-12,) 1000 MCG/ML injection Inject 1,000 mcg into the muscle every 30 (thirty) days.        . ferrous sulfate 325 (65 FE) MG tablet Take 325 mg by mouth daily with breakfast.        . polyethylene glycol powder (GLYCOLAX) powder Take 17 g by mouth daily as needed.  255 g  1  . triamcinolone (KENALOG) 0.1 % cream Apply topically 2 (two) times daily.  30 g  5  . dexlansoprazole (DEXILANT) 60 MG capsule Take 1 capsule (60 mg total) by mouth daily.  31 capsule  11   Allergies as of 06/14/2011 - Review Complete 06/14/2011  Allergen Reaction Noted  . Caffeine  02/05/2007  . Hydromorphone hcl  06/11/2010  .  Ibuprofen  02/05/2007  . Meperidine hcl  02/05/2007  . Morphine  02/05/2007  . Penicillins  10/21/2008  . Propoxyphene hcl  02/05/2007  . Propoxyphene n-acetaminophen  02/05/2007  . Sulfamethoxazole w/trimethoprim  05/02/2007    Family History  Problem Relation Age of Onset  . Hypertension Mother   . Hypertension Father   . Leukemia Father   . Diabetes Brother   . Cancer Brother     melanoma,bone, metastatic unk origin    History   Social History  . Marital Status: Widowed    Spouse Name: N/A    Number of Children: 1  . Years of Education: N/A   Occupational History  . retired; Ship broker    Social History Main Topics  . Smoking status: Former Smoker -- 0.3 packs/day for 20 years    Types: Cigarettes    Quit date: 09/22/2010  . Smokeless  tobacco: Not on file  . Alcohol Use: No  . Drug Use: No  . Sexually Active: Not on file   Other Topics Concern  . Not on file   Social History Narrative   1 daughter lives nearbyPt lives alone  Review of Systems: Gen: Denies any fever, chills, sweats, anorexia, fatigue, weakness, malaise, weight loss, and sleep disorder CV: Denies chest pain, angina, palpitations, syncope, orthopnea, PND, peripheral edema, and claudication. Resp: Denies dyspnea at rest, dyspnea with exercise, cough, sputum, wheezing, coughing up blood, and pleurisy. GI: Denies vomiting blood, jaundice, and fecal incontinence.   Denies dysphagia or odynophagia. Derm: Denies rash, itching, dry skin, hives, moles, warts, or unhealing ulcers.  Psych: Denies depression, anxiety, memory loss, suicidal ideation, hallucinations, paranoia, and confusion. Heme: Denies bruising, bleeding, and enlarged lymph nodes.  Physical Exam: BP 137/74  Pulse 87  Temp(Src) 97.4 F (36.3 C) (Temporal)  Ht 5' 2.5" (1.588 m)  Wt 131 lb 12.8 oz (59.784 kg)  BMI 23.72 kg/m2 General:   Alert,  Well-developed, well-nourished, pleasant and cooperative in NAD Head:  Normocephalic and atraumatic. Eyes:  Sclera clear, no icterus.   Conjunctiva pink. Mouth:  No deformity or lesions, oropharynx pink and moist. Neck:  Supple; no masses or thyromegaly. Heart:  Regular rate and rhythm; no murmurs, clicks, rubs,  or gallops. Abdomen:  Soft and nondistended. Mod epigastric tenderness.  Negative Carnett sign.  No masses, hepatosplenomegaly or hernias noted. Normal bowel sounds, without guarding, and without rebound.   Msk:  Symmetrical without gross deformities. +Kyphosis. Pulses:  Normal pulses noted. Extremities:  With clubbing.  No edema. Neurologic:  Alert and  oriented x4;  grossly normal neurologically. Skin:  Intact without significant lesions or rashes. Cervical Nodes:  No significant cervical adenopathy. Psych:  Alert and cooperative. Normal  mood and affect.

## 2011-06-15 ENCOUNTER — Ambulatory Visit (HOSPITAL_COMMUNITY)
Admission: RE | Admit: 2011-06-15 | Discharge: 2011-06-15 | Disposition: A | Discharge status: Home / Self Care | Payer: Medicare Other | Source: Ambulatory Visit | Attending: Urgent Care | Admitting: Urgent Care

## 2011-06-15 ENCOUNTER — Encounter: Payer: Self-pay | Admitting: Urgent Care

## 2011-06-15 ENCOUNTER — Other Ambulatory Visit: Payer: Self-pay | Admitting: Urgent Care

## 2011-06-15 ENCOUNTER — Ambulatory Visit: Payer: Medicare Other | Admitting: Gastroenterology

## 2011-06-15 DIAGNOSIS — J479 Bronchiectasis, uncomplicated: Secondary | ICD-10-CM | POA: Insufficient documentation

## 2011-06-15 DIAGNOSIS — R109 Unspecified abdominal pain: Secondary | ICD-10-CM | POA: Insufficient documentation

## 2011-06-15 LAB — CBC WITH DIFFERENTIAL/PLATELET
Basophils Absolute: 0 10*3/uL (ref 0.0–0.1)
Eosinophils Absolute: 0 10*3/uL (ref 0.0–0.7)
Lymphocytes Relative: 34 % (ref 12–46)
Lymphs Abs: 1.7 10*3/uL (ref 0.7–4.0)
MCH: 28.2 pg (ref 26.0–34.0)
Neutrophils Relative %: 57 % (ref 43–77)
Platelets: 189 10*3/uL (ref 150–400)
RBC: 3.19 MIL/uL — ABNORMAL LOW (ref 3.87–5.11)
WBC: 5.1 10*3/uL (ref 4.0–10.5)

## 2011-06-15 LAB — AMYLASE: Amylase: 114 U/L — ABNORMAL HIGH (ref 0–105)

## 2011-06-15 LAB — HEPATIC FUNCTION PANEL
ALT: 12 U/L (ref 0–35)
Total Protein: 7.7 g/dL (ref 6.0–8.3)

## 2011-06-15 LAB — CREATININE, SERUM: Creat: 1.28 mg/dL — ABNORMAL HIGH (ref 0.50–1.10)

## 2011-06-15 MED ORDER — IOHEXOL 300 MG/ML  SOLN
100.0000 mL | Freq: Once | INTRAMUSCULAR | Status: AC | PRN
  Administered 2011-06-15: 100 mL via INTRAVENOUS

## 2011-06-15 NOTE — Assessment & Plan Note (Addendum)
Pt was sent for CT a/p w/ IV/orall contrast to r/o pancreatitis given mildly elevated amylase & lipase.  This exam was benign.  I believe her pain may be due to chronic DDD, lumbar stenosis & T5 compression which would correspond w/ pain at the xiphoid process.  She may benefit from further imaging of her thoracic spine & management if appropriate, ie physical therapy, etc.  Given her chronic anemia, we also need to r/o chronic GI bleed.  She has hx B-12 def & has never shown evidence of iron deficiency in past.  No hx h pylori.  Hx gastritis.  If you have severe pain, you will need to go directly to the emergency room Try to stay on clear liquid diet for the next 24 hours

## 2011-06-15 NOTE — Progress Notes (Signed)
Quick Note:  Please let patient know her CT scan is okay. There is no evidence of pancreatitis. She does have progressive lung bronchiectasis and should follow up with Kristian Covey, MD for this. She should continue Dexilant 60 mg daily. Given her anemia, we need to Hemoccult 3 stools. Thanks  [email or web address] W, MD     ______

## 2011-06-15 NOTE — Assessment & Plan Note (Signed)
Stop Nexium Start Dexilant 60 mg daily

## 2011-06-15 NOTE — Progress Notes (Signed)
Quick Note:  Please call patient. She will need a CT scan of the abdomen and pelvis with IV and oral contrast today reason abdominal pain/elevated amylase and lipase She will need a creatinine if not already done. Let her know she still has anemia although it is stable. Some of her enzymes related to her pancreas are elevated. She may have pancreatitis.   ______

## 2011-06-16 NOTE — Progress Notes (Signed)
Quick Note:  Pt aware, hemoccult cards in mail to pt.  Routed copy of CT to Dr. Caryl Never in epic.  ______

## 2011-06-16 NOTE — Progress Notes (Signed)
Cc to PCP 

## 2011-06-21 ENCOUNTER — Ambulatory Visit (INDEPENDENT_AMBULATORY_CARE_PROVIDER_SITE_OTHER): Payer: Medicare Other | Admitting: Family Medicine

## 2011-06-21 ENCOUNTER — Encounter: Payer: Self-pay | Admitting: Family Medicine

## 2011-06-21 DIAGNOSIS — R109 Unspecified abdominal pain: Secondary | ICD-10-CM

## 2011-06-21 NOTE — Patient Instructions (Signed)
Be in touch if you have any recurrent abdominal pain.

## 2011-06-21 NOTE — Progress Notes (Signed)
  Subjective:    Patient ID: Gloria [name], female    DOB: 1929-08-25, 77 y.o.   MRN: [id number]  HPI  Patient seen for followup of recent abdominal pain. She had mostly epigastric pain. We obtained a lipase which was only minimally elevated. She has history of reflux esophagitis, GERD, and esophageal stricture. No recent dysphagia. Previous endoscopy over one year ago. We referred back to gastroenterology. CT scan revealed no acute abnormalities. No evidence for pancreatitis. Possibly some slight progression of lung bronchiectasis. Patient was switched to Dexilant from Nexium but she did not tolerate. She is back now on Nexium. Her symptoms have essentially resolved. She had several labs per gastroenterology. She has chronic anemia which is normocytic and has been worked up extensively including by hematologist with no clear etiology. She had no stool changes. Has had recent colonoscopy. Has not noted any stool changes such as bloody stools or melena.  She has history of T5 compression fracture but this is remote and she has no spinal tenderness whatsoever.  No back pain at this time.  Review of Systems  Constitutional: Negative for fever, chills, appetite change and unexpected weight change.  Respiratory: Negative for shortness of breath.   Cardiovascular: Negative for chest pain.  Gastrointestinal: Negative for nausea, vomiting, abdominal pain, diarrhea, constipation, blood in stool and abdominal distention.  Neurological: Negative for syncope.       Objective:   Physical Exam  Constitutional: She is oriented to person, place, and time. She appears well-developed and well-nourished.  Cardiovascular: Normal rate and regular rhythm.   Pulmonary/Chest: Effort normal and breath sounds normal. No respiratory distress. She has no wheezes. She has no rales.  Abdominal: Soft. She exhibits no distension and no mass. There is no tenderness. There is no rebound and no guarding.  Musculoskeletal: She  exhibits no edema.  Neurological: She is alert and oriented to person, place, and time.          Assessment & Plan:  Abdominal pain improved. Etiology not clear. She remains on Nexium. If pain should recur we'll discuss with her gastroenterologist. She was given recent Hemoccult cards but has not completed.  Her chronic normocytic anemia is unchanged.

## 2011-06-29 ENCOUNTER — Ambulatory Visit: Payer: Medicare Other | Admitting: Family Medicine

## 2011-07-22 ENCOUNTER — Ambulatory Visit (INDEPENDENT_AMBULATORY_CARE_PROVIDER_SITE_OTHER): Payer: Medicare Other | Admitting: Family Medicine

## 2011-07-22 ENCOUNTER — Encounter: Payer: Self-pay | Admitting: Family Medicine

## 2011-07-22 VITALS — BP 160/90 | Temp 97.8°F | Wt 130.0 lb

## 2011-07-22 DIAGNOSIS — J329 Chronic sinusitis, unspecified: Secondary | ICD-10-CM

## 2011-07-22 MED ORDER — AZITHROMYCIN 250 MG PO TABS: ORAL_TABLET | ORAL | Status: AC

## 2011-07-22 MED ORDER — CYCLOSPORINE 0.05 % OP EMUL: 1.0000 [drp] | Freq: Two times a day (BID) | OPHTHALMIC | Status: AC

## 2011-07-22 NOTE — Patient Instructions (Signed)

## 2011-07-22 NOTE — Progress Notes (Signed)
  Subjective:    Patient ID: Gloria [name], female    DOB: 07/09/1930, 77 y.o.   MRN: [id number]  HPI  Acute visit. Patient seen with one day history of bilateral maxillary facial pain. She's had some malaise and nasal congestion. Mild cough. No sore throat. No alleviating factors. No exacerbating factors. She is worried about sinus infection as she's had similar symptoms in the past which led to long-term  infection.   Review of Systems  Constitutional: Positive for fatigue. Negative for fever and chills.  HENT: Positive for congestion. Negative for trouble swallowing and voice change.   Respiratory: Positive for cough.        Objective:   Physical Exam  Constitutional: She appears well-developed and well-nourished.  HENT:  Right Ear: External ear normal.  Left Ear: External ear normal.  Mouth/Throat: Oropharynx is clear and moist.  Neck: Neck supple.  Cardiovascular: Normal rate and regular rhythm.   Pulmonary/Chest: Effort normal and breath sounds normal. No respiratory distress. She has no wheezes. She has no rales.  Lymphadenopathy:    She has no cervical adenopathy.          Assessment & Plan:  URI. At this point recommended plenty of fluids, Mucinex, saline nasal irrigation. Would not start antibiotics but prescription for Zithromax if she has progressive facial pain or other predictors of bacterial sinusitis which were discussed

## 2011-09-16 ENCOUNTER — Ambulatory Visit (INDEPENDENT_AMBULATORY_CARE_PROVIDER_SITE_OTHER): Payer: Medicare Other | Admitting: Family Medicine

## 2011-09-16 ENCOUNTER — Encounter: Payer: Self-pay | Admitting: Family Medicine

## 2011-09-16 VITALS — BP 160/90 | Temp 98.4°F | Wt 130.0 lb

## 2011-09-16 DIAGNOSIS — I499 Cardiac arrhythmia, unspecified: Secondary | ICD-10-CM

## 2011-09-16 DIAGNOSIS — R229 Localized swelling, mass and lump, unspecified: Secondary | ICD-10-CM

## 2011-09-16 DIAGNOSIS — I4891 Unspecified atrial fibrillation: Secondary | ICD-10-CM

## 2011-09-16 LAB — CBC WITH DIFFERENTIAL/PLATELET
Basophils Absolute: 0 10*3/uL (ref 0.0–0.1)
Basophils Relative: 0.5 % (ref 0.0–3.0)
Eosinophils Absolute: 0 10*3/uL (ref 0.0–0.7)
Lymphocytes Relative: 35.5 % (ref 12.0–46.0)
MCHC: 33.6 g/dL (ref 30.0–36.0)
Neutrophils Relative %: 57.2 % (ref 43.0–77.0)
Platelets: 165 10*3/uL (ref 150.0–400.0)
RBC: 3.16 Mil/uL — ABNORMAL LOW (ref 3.87–5.11)

## 2011-09-16 LAB — BASIC METABOLIC PANEL
BUN: 29 mg/dL — ABNORMAL HIGH (ref 6–23)
Chloride: 105 mEq/L (ref 96–112)
GFR: 64.1 mL/min (ref >60.00)
Potassium: 3.7 mEq/L (ref 3.5–5.1)
Sodium: 138 mEq/L (ref 135–145)

## 2011-09-16 MED ORDER — METOPROLOL SUCCINATE ER 50 MG PO TB24: 50.0000 mg | ORAL_TABLET | Freq: Every day | ORAL | Status: DC

## 2011-09-16 MED ORDER — WARFARIN SODIUM 5 MG PO TABS: 5.0000 mg | ORAL_TABLET | Freq: Every day | ORAL | Status: DC

## 2011-09-16 NOTE — Patient Instructions (Signed)
Atrial Fibrillation °Your caregiver has diagnosed you with atrial fibrillation (AFib). The heart normally beats very regularly; AFib is a type of irregular heartbeat. The heart rate may be faster or slower than normal. This can prevent your heart from pumping as well as it should. AFib can be constant (chronic) or intermittent (paroxysmal). °CAUSES  °Atrial fibrillation may be caused by: °· Heart disease, including heart attack, coronary artery disease, heart failure, diseases of the heart valves, and others.  °· Blood clot in the lungs (pulmonary embolism).  °· Pneumonia or other infections.  °· Chronic lung disease.  °· Thyroid disease.  °· Toxins. These include alcohol, some medications (such as decongestant medications or diet pills), and caffeine.  °In some people, no cause for AFib can be found. This is referred to as Lone Atrial Fibrillation. °SYMPTOMS  °· Palpitations or a fluttering in your chest.  °· A vague sense of chest discomfort.  °· Shortness of breath.  °· Sudden onset of lightheadedness or weakness.  °Sometimes, the first sign of AFib can be a complication of the condition. This could be a stroke or heart failure. °DIAGNOSIS  °Your description of your condition may make your caregiver suspicious of atrial fibrillation. Your caregiver will examine your pulse to determine if fibrillation is present. An EKG (electrocardiogram) will confirm the diagnosis. Further testing may help determine what caused you to have atrial fibrillation. This may include chest x-ray, echocardiogram, blood tests, or CT scans. °PREVENTION  °If you have previously had atrial fibrillation, your caregiver may advise you to avoid substances known to cause the condition (such as stimulant medications, and possibly caffeine or alcohol). You may be advised to use medications to prevent recurrence. Proper treatment of any underlying condition is important to help prevent recurrence. °PROGNOSIS  °Atrial fibrillation does tend to  become a chronic condition over time. It can cause significant complications (see below). Atrial fibrillation is not usually immediately life-threatening, but it can shorten your life expectancy. This seems to be worse in women. If you have lone atrial fibrillation and are under 60 years old, the risk of complications is very low, and life expectancy is not shortened. °RISKS AND COMPLICATIONS  °Complications of atrial fibrillation can include stroke, chest pain, and heart failure. Your caregiver will recommend treatments for the atrial fibrillation, as well as for any underlying conditions, to help minimize risk of complications. °TREATMENT  °Treatment for AFib is divided into several categories: °· Treatment of any underlying condition.  °· Converting you out of AFib into a regular (sinus) rhythm.  °· Controlling rapid heart rate.  °· Prevention of blood clots and stroke.  °Medications and procedures are available to convert your atrial fibrillation to sinus rhythm. However, recent studies have shown that this may not offer you any advantage, and cardiac experts are continuing research and debate on this topic. °More important is controlling your rapid heartbeat. The rapid heartbeat causes more symptoms, and places strain on your heart. Your caregiver will advise you on the use of medications that can control your heart rate. °Atrial fibrillation is a strong stroke risk. You can lessen this risk by taking blood thinning medications such as Coumadin (warfarin), or sometimes aspirin. These medications need close monitoring by your caregiver. Over-medication can cause bleeding. Too little medication may not protect against stroke. °HOME CARE INSTRUCTIONS  °· If your caregiver prescribed medicine to make your heartbeat more normally, take as directed.  °· If blood thinners were prescribed by your caregiver, take EXACTLY as directed.  °·   Perform blood tests EXACTLY as directed.  °· Quit smoking. Smoking increases your  cardiac and lung (pulmonary) risks.  °· DO NOT drink alcohol.  °· DO NOT drink caffeinated drinks (e.g. coffee, soda, chocolate, and leaf teas). You may drink decaffeinated coffee, soda or tea.  °· If you are overweight, you should choose a reduced calorie diet to lose weight. Please see a registered dietitian if you need more information about healthy weight loss. DO NOT USE DIET PILLS as they may aggravate heart problems.  °· If you have other heart problems that are causing AFib, you may need to eat a low salt, fat, and cholesterol diet. Your caregiver will tell you if this is necessary.  °· Exercise every day to improve your physical fitness. Stay active unless advised otherwise.  °· If your caregiver has given you a follow-up appointment, it is very important to keep that appointment. Not keeping the appointment could result in heart failure or stroke. If there is any problem keeping the appointment, you must call back to this facility for assistance.  °SEEK MEDICAL CARE IF: °· You notice a change in the rate, rhythm or strength of your heartbeat.  °· You develop an infection or any other change in your overall health status.  °SEEK IMMEDIATE MEDICAL CARE IF:  °· You develop chest pain, abdominal pain, sweating, weakness or feel sick to your stomach (nausea).  °· You develop shortness of breath.  °· You develop swollen feet and ankles.  °· You develop dizziness, numbness, or weakness of your face or limbs, or any change in vision or speech.  °MAKE SURE YOU:  °· Understand these instructions.  °· Will watch your condition.  °· Will get help right away if you are not doing well or get worse.  °Document Released: 07/18/2005 Document Revised: 03/30/2011 Document Reviewed: 02/20/2008 °[employer]® Patient Information ©2012 [employer]. °

## 2011-09-16 NOTE — Progress Notes (Signed)
Subjective:    Patient ID: Gloria [name], female    DOB: July 13, 1930, 78 y.o.   MRN: [id number]  HPI  Patient seen with facial lesion right cheek. Only noticed about one week ago. Nonpainful. Nonpruritic. This has grown rapidly over the past week.  Patient is noted incidentally to have tachycardia and irregular rhythm on exam today. She had very transient episode of atrial fibrillation last year when she was getting nebulizer in emergency room. This was very transient. She has had none since then. She denies recent chest pain. Very mild dizziness earlier today. No syncope. No dyspnea.  Her chronic problems include history of hypertension, osteoporosis, GERD, lumbar stenosis, esophageal stricture, and chronic normocytic anemia. No history of any recent active gastrointestinal bleed  Past Medical History  Diagnosis Date  . Allergy   . B12 deficiency   . Hypertension   . Atrial fibrillation 2011  . GERD (gastroesophageal reflux disease)   . Osteoporosis   . Recurrent UTI   . Esophageal stricture   . Arthritis   . Diverticulosis   . Normocytic anemia   . Chronic epigastric pain     EM Dr. Jarold Motto 2006, low LES pressure, high Demeester, unclear if pt on suppression  . Lumbar stenosis   . Chronic anemia     without evidence of iron deficiency in 2005/2008  . Degenerative disc disease    Past Surgical History  Procedure Date  . Tonsillectomy   . Abdominal hysterectomy 1969    partial, no cancer  . Appendectomy   . Cholecystectomy 1999  . Esophagogastroduodenoscopy 03/19/10    noncritical appearing Schatzki's ring/small hiatal hernia, adenomatous-appearing mucosa-bx benign  . Esophagogastroduodenoscopy 06/04/09    Dr Cherylynn Ridges  . Colonoscopy 2005    reported Dr Doretha Imus    reports that she quit smoking about a year ago. Her smoking use included Cigarettes. She has a 6 pack-year smoking history. She does not have any smokeless tobacco history on file. She  reports that she does not drink alcohol or use illicit drugs. family history includes Cancer in her brother; Diabetes in her brother; Hypertension in her father and mother; and Leukemia in her father. Allergies  Allergen Reactions  . Caffeine     REACTION: Heart racing  . Dexilant     GI upset  . Hydromorphone Hcl   . Ibuprofen     REACTION: Decreased hearing  . Meperidine Hcl     REACTION: Severe palpatations  . Morphine     REACTION: Hallucinations  . Penicillins     rash  . Propoxyphene Hcl     REACTION: Hallucinations  . Propoxyphene N-Acetaminophen     REACTION: Hallucinations  . Sulfamethoxazole W/Trimethoprim     REACTION: Rash      Review of Systems  Constitutional: Negative for fatigue.  Eyes: Negative for visual disturbance.  Respiratory: Negative for cough, chest tightness, shortness of breath and wheezing.   Cardiovascular: Negative for chest pain, palpitations and leg swelling.  Gastrointestinal: Negative for abdominal pain and blood in stool.  Genitourinary: Negative for hematuria.  Neurological: Negative for dizziness, seizures, syncope, weakness, light-headedness and headaches.  Psychiatric/Behavioral: Negative for confusion.       Objective:   Physical Exam  Constitutional: She is oriented to person, place, and time. She appears well-developed and well-nourished.  Cardiovascular:       Patient is tachycardic with irregular rhythm  Pulmonary/Chest: Effort normal and breath sounds normal. No respiratory distress. She has no wheezes. She has no rales.  Musculoskeletal: She exhibits no edema.  Neurological: She is alert and oriented to person, place, and time.  Skin:       Patient has a 6 mm nodular well demarcated lesion which blanches with pressure right cheek          Assessment & Plan:  #1 irregular heart rhythm with tachycardia . Suspect recurrent atrial fibrillation. Check EKG to confirm.  #2 right facial lesion. Suspect angioma or  angiofibroma. Given location,  referral to [facility]. Will address issue above first.  EKG confirms atrial fibrillation.  Rate is up around 120 by palpation but pt does not complain of any dyspnea, dizziness, or chest pain.  Will start beta blocker and coumadin and reassess on Monday.  She is aware to go to ED if she has any symptoms above.  Will check labs including TSH and consider cardiology referral at f/u Monday.  She has chronic normocytic anemia.  No recent bleeding issues.

## 2011-09-19 ENCOUNTER — Ambulatory Visit (INDEPENDENT_AMBULATORY_CARE_PROVIDER_SITE_OTHER): Payer: Medicare Other | Admitting: Family Medicine

## 2011-09-19 ENCOUNTER — Encounter: Payer: Self-pay | Admitting: Family Medicine

## 2011-09-19 VITALS — BP 130/72 | HR 72 | Temp 97.4°F | Resp 12 | Wt 128.0 lb

## 2011-09-19 DIAGNOSIS — R350 Frequency of micturition: Secondary | ICD-10-CM

## 2011-09-19 DIAGNOSIS — K1370 Unspecified lesions of oral mucosa: Secondary | ICD-10-CM

## 2011-09-19 DIAGNOSIS — I4891 Unspecified atrial fibrillation: Secondary | ICD-10-CM

## 2011-09-19 DIAGNOSIS — K1379 Other lesions of oral mucosa: Secondary | ICD-10-CM

## 2011-09-19 LAB — POCT URINALYSIS DIPSTICK
Urobilinogen, UA: 0.2
pH, UA: 5

## 2011-09-19 NOTE — Patient Instructions (Signed)
Reduce metoprolol to one half tablet daily Hold coumadin for now Follow up promptly for any dizziness, fast heart rate, or any irregularity.

## 2011-09-19 NOTE — Progress Notes (Signed)
Subjective:    Patient ID: Gloria [name], female    DOB: January 03, 1930, 78 y.o.   MRN: [id number]  HPI  Three-day followup. Patient incidentally noted to be in atrial fibrillation with rapid ventricular response of 135 but tolerating well. She had noted some mild palpitations earlier that day.  We started beta blocker and Coumadin and by later that day she felt her heart rate had reverted. She had very similar episode back in 2011 of very transatrial fibrillation it lasted less than one day. Echocardiogram then revealed normal ejection fraction no major valvular abnormalities. Patient denies any recent chest pain. Recent thyroid function normal.  Patient also has some urine urgency. No burning with urination. No fever or chills.  Right cheek nodular lesion which appeared recently has grown rapidly. She requests a dermatology referral  Past Medical History  Diagnosis Date  . Allergy   . B12 deficiency   . Hypertension   . Atrial fibrillation 2011  . GERD (gastroesophageal reflux disease)   . Osteoporosis   . Recurrent UTI   . Esophageal stricture   . Arthritis   . Diverticulosis   . Normocytic anemia   . Chronic epigastric pain     EM Dr. Jarold Motto 2006, low LES pressure, high Demeester, unclear if pt on suppression  . Lumbar stenosis   . Chronic anemia     without evidence of iron deficiency in 2005/2008  . Degenerative disc disease    Past Surgical History  Procedure Date  . Tonsillectomy   . Abdominal hysterectomy 1969    partial, no cancer  . Appendectomy   . Cholecystectomy 1999  . Esophagogastroduodenoscopy 03/19/10    noncritical appearing Schatzki's ring/small hiatal hernia, adenomatous-appearing mucosa-bx benign  . Esophagogastroduodenoscopy 06/04/09    Dr Cherylynn Ridges  . Colonoscopy 2005    reported Dr Doretha Imus    reports that she quit smoking about a year ago. Her smoking use included Cigarettes. She has a 6 pack-year smoking history. She does not  have any smokeless tobacco history on file. She reports that she does not drink alcohol or use illicit drugs. family history includes Cancer in her brother; Diabetes in her brother; Hypertension in her father and mother; and Leukemia in her father. Allergies  Allergen Reactions  . Caffeine     REACTION: Heart racing  . Dexilant     GI upset  . Hydromorphone Hcl   . Ibuprofen     REACTION: Decreased hearing  . Meperidine Hcl     REACTION: Severe palpatations  . Morphine     REACTION: Hallucinations  . Penicillins     rash  . Propoxyphene Hcl     REACTION: Hallucinations  . Propoxyphene N-Acetaminophen     REACTION: Hallucinations  . Sulfamethoxazole W/Trimethoprim     REACTION: Rash      Review of Systems  Respiratory: Negative for shortness of breath.   Cardiovascular: Negative for chest pain, palpitations and leg swelling.  Neurological: Negative for syncope.       Objective:   Physical Exam  Constitutional: She appears well-developed and well-nourished.  Cardiovascular: Normal rate and regular rhythm.   Pulmonary/Chest: Effort normal and breath sounds normal. No respiratory distress. She has no wheezes. She has no rales.  Musculoskeletal: She exhibits no edema.          Assessment & Plan:  #1 transient atrial fibrillation. Apparently this lasted only for several hours. She is reluctant to remain on anticoagulants this time. Reduce metoprolol to 25 mg daily.  Reassess in 4 weeks and sooner if she has any palpitations or increased heart rate or dizziness #2 nodular lesion right cheek. Refer [facility] #3 urine urgency. Check UA.  No burning with urination.

## 2011-09-19 NOTE — Progress Notes (Signed)
Quick Note:  Pt informed, and has OV later today ______

## 2011-09-20 ENCOUNTER — Telehealth: Payer: Self-pay | Admitting: Family Medicine

## 2011-09-20 NOTE — Telephone Encounter (Signed)
Go ahead and d/c but she needs to follow up PROMPTLY if she has any recurrent tachycardia.

## 2011-09-20 NOTE — Telephone Encounter (Signed)
Pt wants to D/C the Metoprolol, she is currently taking 1/2 tab and feels her ongoing fatigue is a result of this med Please advise

## 2011-09-20 NOTE — Telephone Encounter (Signed)
Pt is requesting nancy to return her call concerning beta blocker she was put on last wk.

## 2011-09-21 NOTE — Telephone Encounter (Signed)
Pt informed and voiced her understanding, she did stop the med yesterday and reports feeling better today

## 2011-09-22 ENCOUNTER — Other Ambulatory Visit: Payer: Self-pay | Admitting: Family Medicine

## 2011-09-22 LAB — URINE CULTURE: Colony Count: >=100000

## 2011-09-22 MED ORDER — CEPHALEXIN 500 MG PO CAPS: 500.0000 mg | ORAL_CAPSULE | Freq: Four times a day (QID) | ORAL | Status: AC

## 2011-09-22 NOTE — Progress Notes (Signed)
Quick Note:  Pt informed on home VM. I will send and order Keflex ______

## 2011-09-23 NOTE — Progress Notes (Signed)
Quick Note:  Pt was informed yesterday ______

## 2011-10-17 ENCOUNTER — Ambulatory Visit: Payer: Medicare Other | Admitting: Family Medicine

## 2011-10-26 ENCOUNTER — Other Ambulatory Visit: Payer: Self-pay | Admitting: Family Medicine

## 2011-11-14 ENCOUNTER — Ambulatory Visit (INDEPENDENT_AMBULATORY_CARE_PROVIDER_SITE_OTHER): Payer: Medicare Other | Admitting: Family Medicine

## 2011-11-14 ENCOUNTER — Encounter: Payer: Self-pay | Admitting: Family Medicine

## 2011-11-14 VITALS — BP 180/90 | Temp 97.8°F | Wt 130.0 lb

## 2011-11-14 DIAGNOSIS — S30860A Insect bite (nonvenomous) of lower back and pelvis, initial encounter: Secondary | ICD-10-CM

## 2011-11-14 DIAGNOSIS — D539 Nutritional anemia, unspecified: Secondary | ICD-10-CM

## 2011-11-14 DIAGNOSIS — T148XXA Other injury of unspecified body region, initial encounter: Secondary | ICD-10-CM

## 2011-11-14 DIAGNOSIS — I10 Essential (primary) hypertension: Secondary | ICD-10-CM

## 2011-11-14 LAB — CBC WITH DIFFERENTIAL/PLATELET
Basophils Absolute: 0 10*3/uL (ref 0.0–0.1)
Eosinophils Relative: 1.4 % (ref 0.0–5.0)
Lymphs Abs: 1.3 10*3/uL (ref 0.7–4.0)
Monocytes Absolute: 0.3 10*3/uL (ref 0.1–1.0)
Monocytes Relative: 8.8 % (ref 3.0–12.0)
Neutrophils Relative %: 56 % (ref 43.0–77.0)
Platelets: 141 10*3/uL — ABNORMAL LOW (ref 150.0–400.0)
RDW: 13.7 % (ref 11.5–14.6)
WBC: 3.8 10*3/uL — ABNORMAL LOW (ref 4.5–10.5)

## 2011-11-14 MED ORDER — AMLODIPINE BESYLATE 5 MG PO TABS: 5.0000 mg | ORAL_TABLET | Freq: Every day | ORAL | Status: DC

## 2011-11-14 MED ORDER — TETANUS-DIPHTH-ACELL PERTUSSIS 5-2.5-18.5 LF-MCG/0.5 IM SUSP: 0.5000 mL | Freq: Once | INTRAMUSCULAR | Status: DC

## 2011-11-14 MED ORDER — DOXYCYCLINE HYCLATE 100 MG PO TABS: 100.0000 mg | ORAL_TABLET | Freq: Two times a day (BID) | ORAL | Status: AC

## 2011-11-14 NOTE — Progress Notes (Signed)
Addended by: Bonnye Fava on: 11/14/2011 10:27 AM   Modules accepted: Orders

## 2011-11-14 NOTE — Progress Notes (Signed)
Subjective:    Patient ID: Gloria [name], female    DOB: 02-09-1930, 78 y.o.   MRN: [id number]  HPI  Tic bite noted this past weekend. Her daughter remove this yesterday in entirety. She now has area of itching of her back. Apparently, daughter had doxycycline prescription he started this yesterday. She was concerned because of localized rash. She has not had any headaches, generalized rash, fever, chills, or any other acute symptoms.  She's had some generalized fatigue recently. She is concerned because of her history of anemia which has been chronic. Hemoglobin back in February 9.6. No obvious bleeding problems.  Patient has history of hypertension. By home readings consistently around 120/70. Previously she had been on amlodipine but stopped this on her own. Also history of metoprolol secondary atrial fibrillation. She has not any recent palpitations or dizziness. No headaches. No chest pains.  Past Medical History  Diagnosis Date  . Allergy   . B12 deficiency   . Hypertension   . Atrial fibrillation 2011  . GERD (gastroesophageal reflux disease)   . Osteoporosis   . Recurrent UTI   . Esophageal stricture   . Arthritis   . Diverticulosis   . Normocytic anemia   . Chronic epigastric pain     EM Dr. Jarold Motto 2006, low LES pressure, high Demeester, unclear if pt on suppression  . Lumbar stenosis   . Chronic anemia     without evidence of iron deficiency in 2005/2008  . Degenerative disc disease    Past Surgical History  Procedure Date  . Tonsillectomy   . Abdominal hysterectomy 1969    partial, no cancer  . Appendectomy   . Cholecystectomy 1999  . Esophagogastroduodenoscopy 03/19/10    noncritical appearing Schatzki's ring/small hiatal hernia, adenomatous-appearing mucosa-bx benign  . Esophagogastroduodenoscopy 06/04/09    Dr Cherylynn Ridges  . Colonoscopy 2005    reported Dr Doretha Imus    reports that she quit smoking about 13 months ago. Her smoking use  included Cigarettes. She has a 6 pack-year smoking history. She does not have any smokeless tobacco history on file. She reports that she does not drink alcohol or use illicit drugs. family history includes Cancer in her brother; Diabetes in her brother; Hypertension in her father and mother; and Leukemia in her father. Allergies  Allergen Reactions  . Caffeine     REACTION: Heart racing  . Dexilant     GI upset  . Hydromorphone Hcl   . Ibuprofen     REACTION: Decreased hearing  . Meperidine Hcl     REACTION: Severe palpatations  . Morphine     REACTION: Hallucinations  . Penicillins     rash  . Propoxacet-N     REACTION: Hallucinations  . Propoxyphene Hcl     REACTION: Hallucinations  . Sulfamethoxazole W/Trimethoprim     REACTION: Rash      Review of Systems  Constitutional: Negative for chills, fatigue and unexpected weight change.  Eyes: Negative for visual disturbance.  Respiratory: Negative for cough, chest tightness, shortness of breath and wheezing.   Cardiovascular: Negative for chest pain, palpitations and leg swelling.  Skin: Positive for rash (localized only at site of tickbite).  Neurological: Negative for dizziness, seizures, syncope, weakness, light-headedness and headaches.  Psychiatric/Behavioral: Negative for confusion.       Objective:   Physical Exam  Constitutional: She is oriented to person, place, and time. She appears well-developed and well-nourished.  Neck: Neck supple.  Cardiovascular: Normal rate and regular rhythm.  Pulmonary/Chest: Effort normal and breath sounds normal. No respiratory distress. She has no wheezes. She has no rales.  Musculoskeletal: She exhibits no edema.  Lymphadenopathy:    She has no cervical adenopathy.  Neurological: She is alert and oriented to person, place, and time.  Skin:       Localized nontender erythema 1/2 cm diameter upper thoracic area. No pustule          Assessment & Plan:  #1 tick bite with  localized allergic type reaction. We have not recommended antibiotic at this time. Patient is requesting doxycycline. We recommend starting only if he develops any headache, fever, or petechial type rash. #2 hypertension elevated today significantly but control by home readings. Suspect isolated systolic hypertension. Start back amlodipine 5 mg daily and reassess one month. #3 history of fatigue and chronic anemia. Recheck CBC

## 2011-11-14 NOTE — Patient Instructions (Signed)
Wood Tick Bite Ticks are insects that attach themselves to the skin. Most tick bites are harmless, but sometimes ticks carry diseases that can make a person quite ill. The chance of getting ill depends on:  The kind of tick that bites you.   Time of year.   How long the tick is attached.   Geographic location.  Wood ticks are also called dog ticks. They are generally black. They can have white markings. They live in shrubs and grassy areas. They are larger than deer ticks. Wood ticks are about the size of a watermelon seed. They have a hard body. The most common places for ticks to attach themselves are the scalp, neck, armpits, waist, and groin. Wood ticks may stay attached for up to 2 weeks. TICKS MUST BE REMOVED AS SOON AS POSSIBLE TO HELP PREVENT DISEASES CAUSED BY TICK BITES.  TO REMOVE A TICK: 1. If available, put on latex gloves before trying to remove a tick.  2. Grasp the tick as close to the skin as possible, with curved forceps, fine tweezers or a special tick removal tool.  3. Pull gently with steady pressure until the tick lets go. Do not twist the tick or jerk it suddenly. This may break off the tick's head or mouth parts.  4. Do not crush the tick's body. This could force disease-carrying fluids from the tick into your body.  5. After the tick is removed, wash the bite area and your hands with soap and water or other disinfectant.  6. Apply a small amount of antiseptic cream or ointment to the bite site.  7. Wash and disinfect any instruments that were used.  8. Save the tick in a jar or plastic bag for later identification. Preserve the tick with a bit of alcohol or put it in the freezer.  9. Do not apply a hot match, petroleum jelly, or fingernail polish to the tick. This does not work and may increase the chances of disease from the tick bite.  YOU MAY NEED TO SEE YOUR CAREGIVER FOR A TETANUS SHOT NOW IF:  You have no idea when you had the last one.   You have never had a  tetanus shot before.  If you need a tetanus shot, and you decide not to get one, there is a rare chance of getting tetanus. Sickness from tetanus can be serious. If you get a tetanus shot, your arm may swell, get red and warm to the touch at the shot site. This is common and not a problem. PREVENTION  Wear protective clothing. Long sleeves and pants are best.   Wear white clothes to see ticks more easily   Tuck your pant legs into your socks.   If walking on trail, stay in the middle of the trail to avoid brushing against bushes.   Put insect repellent on all exposed skin and along boot tops, pant legs and sleeve cuffs   Check clothing, hair and skin repeatedly and before coming inside.   Brush off any ticks that are not attached.  SEEK MEDICAL CARE IF:   You cannot remove a tick or part of the tick that is left in the skin.   Unexplained fever.   Redness and swelling in the area of the tick bite.   Tender, swollen lymph glands.   Diarrhea.   Weight loss.   Cough.   Fatigue.   Muscle, joint or bone pain.   Belly pain.   Headache.   Rash.    SEEK IMMEDIATE MEDICAL CARE IF:   You develop an oral temperature above 102 F (38.9 C).   You are having trouble walking or moving your legs.   Numbness in the legs.   Shortness of breath.   Confusion.   Repeated vomiting.  Document Released: 07/15/2000 Document Revised: 07/07/2011 Document Reviewed: 06/23/2008 [employer] Patient Information 2012 [employer]. 

## 2011-11-15 NOTE — Progress Notes (Signed)
Quick Note:  Pt informed, she has appt in one month ______

## 2011-11-29 ENCOUNTER — Telehealth: Payer: Self-pay | Admitting: Family Medicine

## 2011-11-29 DIAGNOSIS — M549 Dorsalgia, unspecified: Secondary | ICD-10-CM

## 2011-11-29 NOTE — Telephone Encounter (Signed)
Pt requesting a referral to Dr. Trey Sailors (neuro surgen)  For her back pain

## 2011-11-29 NOTE — Telephone Encounter (Signed)
OK to refer.  Dr Channing Mutters is in [place] now.

## 2011-11-30 NOTE — Telephone Encounter (Signed)
Pt informed & referral made

## 2011-12-07 ENCOUNTER — Telehealth: Payer: Self-pay | Admitting: Family Medicine

## 2011-12-07 NOTE — Telephone Encounter (Signed)
Open in error

## 2011-12-08 ENCOUNTER — Other Ambulatory Visit: Payer: Self-pay | Admitting: *Deleted

## 2011-12-08 DIAGNOSIS — M549 Dorsalgia, unspecified: Secondary | ICD-10-CM

## 2011-12-14 ENCOUNTER — Encounter: Payer: Self-pay | Admitting: Family Medicine

## 2011-12-14 ENCOUNTER — Ambulatory Visit (INDEPENDENT_AMBULATORY_CARE_PROVIDER_SITE_OTHER): Payer: Medicare Other | Admitting: Family Medicine

## 2011-12-14 VITALS — BP 150/80 | Temp 97.7°F | Wt 128.0 lb

## 2011-12-14 DIAGNOSIS — M5416 Radiculopathy, lumbar region: Secondary | ICD-10-CM

## 2011-12-14 DIAGNOSIS — D539 Nutritional anemia, unspecified: Secondary | ICD-10-CM

## 2011-12-14 DIAGNOSIS — IMO0002 Reserved for concepts with insufficient information to code with codable children: Secondary | ICD-10-CM

## 2011-12-14 LAB — CBC WITH DIFFERENTIAL/PLATELET
Basophils Relative: 0.5 % (ref 0.0–3.0)
Eosinophils Relative: 0 % (ref 0.0–5.0)
HCT: 25.6 % — ABNORMAL LOW (ref 36.0–46.0)
MCV: 90.4 fl (ref 78.0–100.0)
Monocytes Absolute: 0.4 10*3/uL (ref 0.1–1.0)
Monocytes Relative: 10.3 % (ref 3.0–12.0)
Neutrophils Relative %: 61.9 % (ref 43.0–77.0)
Platelets: 158 10*3/uL (ref 150.0–400.0)
RBC: 2.83 Mil/uL — ABNORMAL LOW (ref 3.87–5.11)
WBC: 4.2 10*3/uL — ABNORMAL LOW (ref 4.5–10.5)

## 2011-12-14 NOTE — Progress Notes (Signed)
  Subjective:    Patient ID: Gloria [name], female    DOB: 1930/03/05, 78 y.o.   MRN: [id number]  HPI  History of known lumbar stenosis. Progressive right lumbar back pain with radiation occasionally toward feet. She had MRI scan 08/22/2008 L5-S1 lumbar stenosis with degenerative spondylosis. Check progression especially the past several months. She tried to get back into see neurosurgeon they requested MRI first. Her pain is dull. Moderate severity. No numbness but possibly some weakness. She is having increased difficulties with ambulation which she attributes to right lower weakness. No incontinence symptoms. No alleviating factors. Worse with movement such as ambulation.  Patient's chronic anemia. Recent hemoglobin 8.6 and needs repeated this time. No active bleeding.   Review of Systems  Constitutional: Negative for fever, chills, appetite change and unexpected weight change.  Cardiovascular: Negative for chest pain.  Gastrointestinal: Negative for abdominal pain.  Genitourinary: Negative for difficulty urinating.  Neurological: Positive for weakness. Negative for numbness.  Hematological: Negative for adenopathy.       Objective:   Physical Exam  Constitutional: She appears well-developed and well-nourished.  Cardiovascular: Normal rate and regular rhythm.   Pulmonary/Chest: Effort normal and breath sounds normal. No respiratory distress. She has no wheezes. She has no rales.  Musculoskeletal:       Patient has kyphoscoliosis. She has minimal pain right lower lumbar region. Straight leg raise is negative.  Normal distal pulses.  Neurological:       Deep tendon reflexes symmetric knee and ankle bilaterally. She has general weakness right lower extremity with hip flexion, knee extension, plantar flexion and dorsi flexion. Normal sensory function.          Assessment & Plan:  Progressive right lumbar back pain with right radiculopathy symptoms. Repeat MRI lumbar spine. Consider  neurosurgical referral based on results.  Chronic anemia. Recheck CBC today

## 2011-12-15 NOTE — Progress Notes (Signed)
Quick Note:  Pt informed ______ 

## 2011-12-16 ENCOUNTER — Encounter: Payer: Self-pay | Admitting: Family Medicine

## 2011-12-16 ENCOUNTER — Ambulatory Visit (INDEPENDENT_AMBULATORY_CARE_PROVIDER_SITE_OTHER): Payer: Medicare Other | Admitting: Family Medicine

## 2011-12-16 VITALS — BP 98/58 | Temp 98.0°F | Wt 128.0 lb

## 2011-12-16 DIAGNOSIS — M542 Cervicalgia: Secondary | ICD-10-CM

## 2011-12-16 NOTE — Patient Instructions (Signed)
May take Klonopin 0.5 mg one half tablet twice daily Continue with topical heat and muscle massage

## 2011-12-16 NOTE — Progress Notes (Signed)
  Subjective:    Patient ID: Gloria [name], female    DOB: 05/25/1930, 78 y.o.   MRN: [id number]  HPI  Patient seen with neck pain. Mostly right side. No injury. Muscle tightening. Previous intolerance to muscle relaxers. She did have some leftover Klonopin 0.5 mg and took only one quarter tablet which seemed to help. She tried heat and ice without much relief. Occasional tingling sensation right hand. No definite weakness. She is having some recent lumbar radiculopathy symptoms and has MRI scheduled for this coming Monday.   Review of Systems  Constitutional: Negative for appetite change and unexpected weight change.  Cardiovascular: Negative for chest pain.  Neurological: Negative for weakness.       Objective:   Physical Exam  Constitutional: She appears well-developed and well-nourished.  HENT:  Mouth/Throat: Oropharynx is clear and moist.  Neck:       Neck reveals some palpable muscular tension mostly right sternocleidomastoid with lesser trapezius tenderness. She has mild lesser tenderness left sternocleidomastoid.  Cardiovascular: Normal rate and regular rhythm.   Pulmonary/Chest: Effort normal and breath sounds normal. No respiratory distress. She has no wheezes. She has no rales.          Assessment & Plan:  #1 neck pain. Suspect muscular. Previous intolerance to muscle relaxers. Continue Klonopin 0.5 mg one half tablet at night. Continue topical heat. #2 history of chronic anemia. Recent hemoglobin 8.6. Reassess in one to 2 months.

## 2011-12-19 ENCOUNTER — Ambulatory Visit
Admission: RE | Admit: 2011-12-19 | Discharge: 2011-12-19 | Disposition: A | Discharge status: Home / Self Care | Payer: Medicare Other | Source: Ambulatory Visit | Attending: Family Medicine | Admitting: Family Medicine

## 2011-12-19 DIAGNOSIS — M5416 Radiculopathy, lumbar region: Secondary | ICD-10-CM

## 2011-12-20 ENCOUNTER — Other Ambulatory Visit: Payer: Self-pay | Admitting: Family Medicine

## 2011-12-20 ENCOUNTER — Other Ambulatory Visit: Payer: Self-pay | Admitting: *Deleted

## 2011-12-20 DIAGNOSIS — Z139 Encounter for screening, unspecified: Secondary | ICD-10-CM

## 2011-12-20 DIAGNOSIS — IMO0002 Reserved for concepts with insufficient information to code with codable children: Secondary | ICD-10-CM

## 2011-12-20 NOTE — Progress Notes (Signed)
Quick Note:  Pt informed, will make referral ______

## 2011-12-23 ENCOUNTER — Telehealth: Payer: Self-pay | Admitting: *Deleted

## 2011-12-23 NOTE — Telephone Encounter (Signed)
[facility] called pt to schedule appt and pt was told she was told she would have to stay with Dr Jordan Likes (she is established with him), ane pt declined to schedule appt.  Terri, our scheduling coordinator received the call from their office. FYI and please advise

## 2011-12-23 NOTE — Telephone Encounter (Signed)
Her only other option is to see another group or try to get back in to see Dr Channing Mutters in [place].

## 2011-12-27 NOTE — Telephone Encounter (Signed)
Call placed to patient at [phone], she stated that she will see Dr Channing Mutters in [place] and she would like our office to arrange the referral for her.

## 2011-12-27 NOTE — Telephone Encounter (Signed)
Pt needs to call Dr Channing Mutters to see if their group will see her.

## 2011-12-28 ENCOUNTER — Telehealth: Payer: Self-pay | Admitting: Family Medicine

## 2011-12-28 NOTE — Telephone Encounter (Signed)
Call placed to patient at [phone], she was advised per Dr Caryl Never instructions, has verbalized understanding, and agrees as instructed

## 2011-12-28 NOTE — Telephone Encounter (Signed)
FYI- pt called to make you aware that she is set up with e neuro surgeon on June 14th

## 2012-01-02 ENCOUNTER — Telehealth: Payer: Self-pay | Admitting: Family Medicine

## 2012-01-02 NOTE — Telephone Encounter (Signed)
Pt called and wanted to know if Gloria [name] could check on ov pt had in 03/2010 and see what anethesia and pain med Dr Roetta Sessions gave pt?

## 2012-01-02 NOTE — Telephone Encounter (Signed)
Pt informed we do not have those records, suggested she contact Dr Luvenia Starch office

## 2012-01-05 ENCOUNTER — Ambulatory Visit (HOSPITAL_COMMUNITY): Payer: Self-pay

## 2012-01-06 ENCOUNTER — Ambulatory Visit (HOSPITAL_COMMUNITY)
Admission: RE | Admit: 2012-01-06 | Discharge: 2012-01-06 | Disposition: A | Discharge status: Home / Self Care | Payer: Medicare Other | Source: Ambulatory Visit | Attending: Family Medicine | Admitting: Family Medicine

## 2012-01-06 DIAGNOSIS — Z139 Encounter for screening, unspecified: Secondary | ICD-10-CM

## 2012-01-06 DIAGNOSIS — Z1231 Encounter for screening mammogram for malignant neoplasm of breast: Secondary | ICD-10-CM | POA: Insufficient documentation

## 2012-01-23 ENCOUNTER — Encounter: Payer: Self-pay | Admitting: Family

## 2012-01-23 ENCOUNTER — Ambulatory Visit (INDEPENDENT_AMBULATORY_CARE_PROVIDER_SITE_OTHER): Payer: Medicare Other | Admitting: Family

## 2012-01-23 VITALS — BP 136/80 | Temp 98.1°F | Wt 126.0 lb

## 2012-01-23 DIAGNOSIS — W57XXXA Bitten or stung by nonvenomous insect and other nonvenomous arthropods, initial encounter: Secondary | ICD-10-CM

## 2012-01-23 DIAGNOSIS — T148XXA Other injury of unspecified body region, initial encounter: Secondary | ICD-10-CM

## 2012-01-23 DIAGNOSIS — D649 Anemia, unspecified: Secondary | ICD-10-CM

## 2012-01-23 DIAGNOSIS — F419 Anxiety disorder, unspecified: Secondary | ICD-10-CM

## 2012-01-23 DIAGNOSIS — T148 Other injury of unspecified body region: Secondary | ICD-10-CM

## 2012-01-23 DIAGNOSIS — F411 Generalized anxiety disorder: Secondary | ICD-10-CM

## 2012-01-23 MED ORDER — DOXYCYCLINE HYCLATE 100 MG PO TABS: 100.0000 mg | ORAL_TABLET | Freq: Two times a day (BID) | ORAL | Status: AC

## 2012-01-23 MED ORDER — CLONAZEPAM 0.5 MG PO TABS: 0.5000 mg | ORAL_TABLET | Freq: Every evening | ORAL | Status: DC | PRN

## 2012-01-23 NOTE — Patient Instructions (Signed)
Insect Bite Mosquitoes, flies, fleas, bedbugs, and many other insects can bite. Insect bites are different from insect stings. A sting is when venom is injected into the skin. Some insect bites can transmit infectious diseases. SYMPTOMS  Insect bites usually turn red, swell, and itch for 2 to 4 days. They often go away on their own. TREATMENT  Your caregiver may prescribe antibiotic medicines if a bacterial infection develops in the bite. HOME CARE INSTRUCTIONS  Do not scratch the bite area.   Keep the bite area clean and dry. Wash the bite area thoroughly with soap and water.   Put ice or cool compresses on the bite area.   Put ice in a plastic bag.   Place a towel between your skin and the bag.   Leave the ice on for 20 minutes, 4 times a day for the first 2 to 3 days, or as directed.   You may apply a baking soda paste, cortisone cream, or calamine lotion to the bite area as directed by your caregiver. This can help reduce itching and swelling.   Only take over-the-counter or prescription medicines as directed by your caregiver.   If you are given antibiotics, take them as directed. Finish them even if you start to feel better.  You may need a tetanus shot if:  You cannot remember when you had your last tetanus shot.   You have never had a tetanus shot.   The injury broke your skin.  If you get a tetanus shot, your arm may swell, get red, and feel warm to the touch. This is common and not a problem. If you need a tetanus shot and you choose not to have one, there is a rare chance of getting tetanus. Sickness from tetanus can be serious. SEEK IMMEDIATE MEDICAL CARE IF:   You have increased pain, redness, or swelling in the bite area.   You see a red line on the skin coming from the bite.   You have a fever.   You have joint pain.   You have a headache or neck pain.   You have unusual weakness.   You have a rash.   You have chest pain or shortness of breath.   You  have abdominal pain, nausea, or vomiting.   You feel unusually tired or sleepy.  MAKE SURE YOU:   Understand these instructions.   Will watch your condition.   Will get help right away if you are not doing well or get worse.  Document Released: 08/25/2004 Document Revised: 07/07/2011 Document Reviewed: 02/16/2011 [employer] Patient Information 2012 [employer]. 

## 2012-01-23 NOTE — Progress Notes (Signed)
Subjective:    Patient ID: Gloria [name], female    DOB: August 03, 1929, 78 y.o.   MRN: [id number]  HPI 78 year old white female, nonsmoker is in with complaints of a spider bite in the back of her left leg that's been present x1 week. She describes the area as red, tender, and itchy. She denies any drainage or discharge. She has not taken any medication for it. But the redness is worsening.  Patient has a history of anemia I will like to have her CBC drawn today instead of next week.  Patient has a history of anxiety and difficulty falling asleep. In the past Dr. Caryl Never is prescribed Klonopin 0.5 mg for which she takes the fourth of a tablet to help rest at night. She's requesting a refill since her refills of expired.   Review of Systems  Constitutional: Negative.   Respiratory: Negative.   Cardiovascular: Negative.   Musculoskeletal: Negative.   Skin: Positive for rash.       Red, tender area noted to the back of the left calf  Neurological: Negative.   Psychiatric/Behavioral: Positive for disturbed wake/sleep cycle. The patient is nervous/anxious.        Requesting a refill on Klonopin.   Past Medical History  Diagnosis Date  . Allergy   . B12 deficiency   . Hypertension   . Atrial fibrillation 2011  . GERD (gastroesophageal reflux disease)   . Osteoporosis   . Recurrent UTI   . Esophageal stricture   . Arthritis   . Diverticulosis   . Normocytic anemia   . Chronic epigastric pain     EM Dr. Jarold Motto 2006, low LES pressure, high Demeester, unclear if pt on suppression  . Lumbar stenosis   . Chronic anemia     without evidence of iron deficiency in 2005/2008  . Degenerative disc disease     History   Social History  . Marital Status: Widowed    Spouse Name: N/A    Number of Children: 1  . Years of Education: N/A   Occupational History  . retired; Ship broker    Social History Main Topics  . Smoking status: Former Smoker -- 0.3 packs/day for 20 years    Types:  Cigarettes    Quit date: 09/22/2010  . Smokeless tobacco: Not on file  . Alcohol Use: No  . Drug Use: No  . Sexually Active: Not on file   Other Topics Concern  . Not on file   Social History Narrative   1 daughter lives nearbyPt lives alone    Past Surgical History  Procedure Date  . Tonsillectomy   . Abdominal hysterectomy 1969    partial, no cancer  . Appendectomy   . Cholecystectomy 1999  . Esophagogastroduodenoscopy 03/19/10    noncritical appearing Schatzki's ring/small hiatal hernia, adenomatous-appearing mucosa-bx benign  . Esophagogastroduodenoscopy 06/04/09    Dr Cherylynn Ridges  . Colonoscopy 2005    reported Dr Doretha Imus    Family History  Problem Relation Age of Onset  . Hypertension Mother   . Hypertension Father   . Leukemia Father   . Diabetes Brother   . Cancer Brother     melanoma,bone, metastatic unk origin    Allergies  Allergen Reactions  . Caffeine     REACTION: Heart racing  . Dexlansoprazole     GI upset  . Hydromorphone Hcl   . Ibuprofen     REACTION: Decreased hearing  . Meperidine Hcl     REACTION: Severe palpatations  .  Morphine     REACTION: Hallucinations  . Penicillins     rash  . Propoxyphene Hcl     REACTION: Hallucinations  . Propoxyphene-Acetaminophen     REACTION: Hallucinations  . Sulfamethoxazole W-Trimethoprim     REACTION: Rash    Current Outpatient Prescriptions on File Prior to Visit  Medication Sig Dispense Refill  . amLODipine (NORVASC) 5 MG tablet Take 1 tablet (5 mg total) by mouth daily.  30 tablet  11  . calcium-vitamin D 250-100 MG-UNIT per tablet Take 1 tablet by mouth 2 (two) times daily.        . Cholecalciferol (VITAMIN D3) 3000 UNITS TABS Take by mouth daily.        . cyanocobalamin (,VITAMIN B-12,) 1000 MCG/ML injection USE ONE ML ONCE MONTHLY  10 mL  0  . esomeprazole (NEXIUM) 40 MG capsule Take 40 mg by mouth 2 (two) times daily.        . ferrous sulfate 325 (65 FE) MG tablet  Take 325 mg by mouth daily with breakfast.        . polyethylene glycol powder (GLYCOLAX) powder Take 17 g by mouth daily as needed.  255 g  1  . triamcinolone cream (KENALOG) 0.1 % Apply topically 2 (two) times daily as needed.      . vitamin C (ASCORBIC ACID) 500 MG tablet Take 500 mg by mouth daily.        . clonazePAM (KLONOPIN) 0.5 MG tablet May use 1/2 to 1 tab at bedtime as needed  30 tablet  1    BP 136/80  Temp 98.1 F (36.7 C) (Oral)  Wt 126 lb (57.153 kg)chart    Objective:   Physical Exam  Constitutional: She is oriented to person, place, and time. She appears well-developed and well-nourished.  HENT:  Right Ear: External ear normal.  Left Ear: External ear normal.  Nose: Nose normal.  Mouth/Throat: Oropharynx is clear and moist.  Neck: Normal range of motion. Neck supple.  Cardiovascular: Normal rate, regular rhythm and normal heart sounds.   Pulmonary/Chest: Effort normal and breath sounds normal.  Abdominal: Soft. Bowel sounds are normal.  Musculoskeletal: Normal range of motion.  Neurological: She is alert and oriented to person, place, and time.  Skin: Skin is warm and dry.       2 cm Circular area noted to the posterior left calf. Red, tender, minimally warm to touch. No drainage or discharge.  Psychiatric: She has a normal mood and affect.          Assessment & Plan:  Assessment: Anemia, Insect bite, Insomnia/Anxiety  Plan: Doxycycline 100mg  BID x 7 days.  Refilled Klonopin. CBC drawn today. Patient to call the office if symptoms worsen or persist. Recheck with PCP as scheduled and sooner prn.

## 2012-01-26 ENCOUNTER — Telehealth: Payer: Self-pay | Admitting: Family Medicine

## 2012-01-26 NOTE — Telephone Encounter (Signed)
Saw Padonda. This was ordered but I did not see resulted. Check to see if CBC sent.

## 2012-01-26 NOTE — Telephone Encounter (Signed)
Pt called and said that she had labs done on Monday 01/23/12 and pt would like to get lab results.

## 2012-01-27 LAB — CBC WITH DIFFERENTIAL/PLATELET
Eosinophils Absolute: 0 10*3/uL (ref 0.0–0.7)
HCT: 24.7 % — ABNORMAL LOW (ref 36.0–46.0)
Lymphs Abs: 1.4 10*3/uL (ref 0.7–4.0)
MCHC: 33 g/dL (ref 30.0–36.0)
MCV: 91.3 fl (ref 78.0–100.0)
Monocytes Absolute: 0.3 10*3/uL (ref 0.1–1.0)
Neutrophils Relative %: 48.5 % (ref 43.0–77.0)
Platelets: 140 10*3/uL — ABNORMAL LOW (ref 150.0–400.0)

## 2012-02-14 ENCOUNTER — Ambulatory Visit: Payer: Medicare Other | Admitting: Family Medicine

## 2012-04-25 ENCOUNTER — Other Ambulatory Visit: Payer: Self-pay | Admitting: Family Medicine

## 2012-04-25 MED ORDER — "SYRINGE/NEEDLE (DISP) 25G X 1"" 3 ML MISC": Status: DC

## 2012-04-25 NOTE — Telephone Encounter (Addendum)
Pt needs b12 syringes call [facility]

## 2012-05-01 ENCOUNTER — Other Ambulatory Visit: Payer: Self-pay | Admitting: *Deleted

## 2012-05-01 MED ORDER — ESOMEPRAZOLE MAGNESIUM 40 MG PO CPDR: 40.0000 mg | DELAYED_RELEASE_CAPSULE | Freq: Two times a day (BID) | ORAL | Status: DC

## 2012-05-02 ENCOUNTER — Ambulatory Visit: Payer: Medicare Other | Attending: Neurosurgery | Admitting: Physical Therapy

## 2012-05-02 DIAGNOSIS — R5381 Other malaise: Secondary | ICD-10-CM | POA: Insufficient documentation

## 2012-05-02 DIAGNOSIS — IMO0001 Reserved for inherently not codable concepts without codable children: Secondary | ICD-10-CM | POA: Insufficient documentation

## 2012-05-02 DIAGNOSIS — M6281 Muscle weakness (generalized): Secondary | ICD-10-CM | POA: Insufficient documentation

## 2012-08-28 ENCOUNTER — Other Ambulatory Visit: Payer: Self-pay | Admitting: Family Medicine

## 2012-10-05 ENCOUNTER — Encounter: Payer: Medicare Other | Admitting: Family Medicine

## 2012-10-15 ENCOUNTER — Encounter: Payer: Medicare Other | Admitting: Family Medicine

## 2012-10-19 ENCOUNTER — Encounter: Payer: Self-pay | Admitting: Family Medicine

## 2012-10-19 ENCOUNTER — Ambulatory Visit (INDEPENDENT_AMBULATORY_CARE_PROVIDER_SITE_OTHER): Payer: Medicare Other | Admitting: Family Medicine

## 2012-10-19 VITALS — BP 140/70 | HR 80 | Temp 97.7°F | Resp 12 | Ht 60.0 in | Wt 120.0 lb

## 2012-10-19 DIAGNOSIS — E538 Deficiency of other specified B group vitamins: Secondary | ICD-10-CM

## 2012-10-19 DIAGNOSIS — K219 Gastro-esophageal reflux disease without esophagitis: Secondary | ICD-10-CM

## 2012-10-19 DIAGNOSIS — M810 Age-related osteoporosis without current pathological fracture: Secondary | ICD-10-CM

## 2012-10-19 DIAGNOSIS — R634 Abnormal weight loss: Secondary | ICD-10-CM

## 2012-10-19 DIAGNOSIS — Z0000 Encounter for general adult medical examination without abnormal findings: Secondary | ICD-10-CM

## 2012-10-19 DIAGNOSIS — E785 Hyperlipidemia, unspecified: Secondary | ICD-10-CM

## 2012-10-19 DIAGNOSIS — D539 Nutritional anemia, unspecified: Secondary | ICD-10-CM

## 2012-10-19 LAB — CBC WITH DIFFERENTIAL/PLATELET
Basophils Absolute: 0 10*3/uL (ref 0.0–0.1)
Eosinophils Relative: 0 % (ref 0.0–5.0)
HCT: 27 % — ABNORMAL LOW (ref 36.0–46.0)
Hemoglobin: 9 g/dL — ABNORMAL LOW (ref 12.0–15.0)
Lymphocytes Relative: 33.4 % (ref 12.0–46.0)
Lymphs Abs: 1.2 10*3/uL (ref 0.7–4.0)
Monocytes Relative: 7.9 % (ref 3.0–12.0)
Platelets: 154 10*3/uL (ref 150.0–400.0)
RDW: 13.9 % (ref 11.5–14.6)
WBC: 3.5 10*3/uL — ABNORMAL LOW (ref 4.5–10.5)

## 2012-10-19 LAB — HEPATIC FUNCTION PANEL
AST: 26 U/L (ref 0–37)
Albumin: 3.8 g/dL (ref 3.5–5.2)
Alkaline Phosphatase: 63 U/L (ref 39–117)
Bilirubin, Direct: 0.1 mg/dL (ref 0.0–0.3)

## 2012-10-19 LAB — TSH: TSH: 1.41 u[IU]/mL (ref 0.35–5.50)

## 2012-10-19 LAB — LIPID PANEL
Cholesterol: 152 mg/dL (ref 0–200)
HDL: 49.3 mg/dL (ref >39.00)
LDL Cholesterol: 91 mg/dL (ref 0–99)
Triglycerides: 61 mg/dL (ref 0.0–149.0)
VLDL: 12.2 mg/dL (ref 0.0–40.0)

## 2012-10-19 LAB — POCT URINALYSIS DIPSTICK
Bilirubin, UA: NEGATIVE
Blood, UA: NEGATIVE
Glucose, UA: NEGATIVE
Ketones, UA: NEGATIVE
Spec Grav, UA: 1.02
Urobilinogen, UA: 0.2

## 2012-10-19 LAB — BASIC METABOLIC PANEL
BUN: 45 mg/dL — ABNORMAL HIGH (ref 6–23)
Calcium: 9 mg/dL (ref 8.4–10.5)
GFR: 41.81 mL/min — ABNORMAL LOW (ref >60.00)
Glucose, Bld: 91 mg/dL (ref 70–99)
Potassium: 4.4 mEq/L (ref 3.5–5.1)
Sodium: 137 mEq/L (ref 135–145)

## 2012-10-19 NOTE — Progress Notes (Signed)
Subjective:    Patient ID: Gloria [name], female    DOB: [birth year], 79 y.o.   MRN: [id number]  HPI Patient seen for medical followup and Medicare wellness exam.  Her chronic problems include history of GERD, osteoporosis, lumbar stenosis, hypertension, esophageal stricture, chronic normocytic anemia, diverticulosis, B12 deficiency, and transient atrial fibrillation. She had been treated with amlodipine but took herself off. By home blood pressures very stable  Immunizations up to date. Colonoscopy 2011. Mammogram last June. Previous hysterectomy so no indication for Pap smears. Previous intolerance to bisphosphonates and she has refused other injectable osteoporosis therapies so we have not pursued regular DEXA scans. She does take calcium and vitamin D.  Past Medical History  Diagnosis Date  . Allergy   . B12 deficiency   . Hypertension   . Atrial fibrillation 2011  . GERD (gastroesophageal reflux disease)   . Osteoporosis   . Recurrent UTI   . Esophageal stricture   . Arthritis   . Diverticulosis   . Normocytic anemia   . Chronic epigastric pain     EM Dr. Jarold Motto 2006, low LES pressure, high Demeester, unclear if pt on suppression  . Lumbar stenosis   . Chronic anemia     without evidence of iron deficiency in 2005/2008  . Degenerative disc disease    Past Surgical History  Procedure Laterality Date  . Tonsillectomy    . Abdominal hysterectomy  1969    partial, no cancer  . Appendectomy    . Cholecystectomy  1999  . Esophagogastroduodenoscopy  03/19/10    noncritical appearing Schatzki's ring/small hiatal hernia, adenomatous-appearing mucosa-bx benign  . Esophagogastroduodenoscopy  06/04/09    Dr Cherylynn Ridges  . Colonoscopy  2005    reported Dr Doretha Imus    reports that she quit smoking about 2 years ago. Her smoking use included Cigarettes. She has a 6 pack-year smoking history. She does not have any smokeless tobacco history on file. She reports  that she does not drink alcohol or use illicit drugs. family history includes Cancer in her brother; Diabetes in her brother; Hypertension in her father and mother; and Leukemia in her father. Allergies  Allergen Reactions  . Caffeine     REACTION: Heart racing  . Dexlansoprazole     GI upset  . Hydromorphone Hcl   . Ibuprofen     REACTION: Decreased hearing  . Meperidine Hcl     REACTION: Severe palpatations  . Morphine     REACTION: Hallucinations  . Penicillins     rash  . Propoxyphene Hcl     REACTION: Hallucinations  . Propoxyphene-Acetaminophen     REACTION: Hallucinations  . Sulfamethoxazole W-Trimethoprim     REACTION: Rash   1.  Risk factors based on Past Medical , Social, and Family history reviewed and as above 2.  Limitations in physical activities low risk for fall 3.  Depression/mood no depression or anxiety issues currently 4.  Hearing no major hearing loss 5.  ADLs independent in all 6.  Cognitive function (orientation to time and place, language, writing, speech,memory) no memory deficits. No language deficits 7.  Home Safety discussed prevention of falls. No concerns 8.  Height, weight, and visual acuity. 10 pound weight loss as above over the past several months. No visual changes 9.  Counseling discussed importance of weight bearing exercise and adequate calcium and vitamin D 10. Recommendation of preventive services. Continue yearly mammograms. Immunizations up to date with exception shingles vaccine. She declines. 11. Labs based on  risk factors lipid, hepatic, basic metabolic panel, TSH, B12 12. Care Plan as above.    Review of Systems  Constitutional: Positive for unexpected weight change. Negative for fever, chills, appetite change and fatigue.  HENT: Negative for sore throat, trouble swallowing and voice change.   Eyes: Negative for visual disturbance.  Respiratory: Negative for cough, shortness of breath and wheezing.   Cardiovascular: Negative  for chest pain, palpitations and leg swelling.  Gastrointestinal: Negative for nausea, vomiting, abdominal pain, diarrhea and blood in stool.  Genitourinary: Negative for dysuria and hematuria.  Skin: Negative for rash.  Neurological: Negative for dizziness, syncope and weakness.  Hematological: Negative for adenopathy. Does not bruise/bleed easily.       Objective:   Physical Exam  Constitutional: She is oriented to person, place, and time. She appears well-developed and well-nourished.  HENT:  Right Ear: External ear normal.  Left Ear: External ear normal.  Mouth/Throat: Oropharynx is clear and moist.  Eyes: Pupils are equal, round, and reactive to light.  Neck: Neck supple. No thyromegaly present.  Cardiovascular: Normal rate and regular rhythm.   Pulmonary/Chest: Effort normal and breath sounds normal. No respiratory distress. She has no wheezes. She has no rales.  Abdominal: Soft. Bowel sounds are normal. She exhibits no distension and no mass. There is no tenderness. There is no rebound and no guarding.  Musculoskeletal: She exhibits no edema.  Lymphadenopathy:    She has no cervical adenopathy.  Neurological: She is alert and oriented to person, place, and time.  Skin: No rash noted.  Psychiatric: She has a normal mood and affect. Her behavior is normal.          Assessment & Plan:  #1 health maintenance. Discussed shingles vaccine and she's not interested. Colonoscopy up to date. Continue yearly mammogram. Discussed treatment of osteoporosis and further screening such as DEXA scan and she's not interested #2 hypertension. Controlled by home readings. Continue close monitoring  #3 history of GERD. Currently stable. No recent dysphagia #4 weight loss. Mild. She does not have any loss of appetite and appears well. Check screening labs No history of recent depression #5 B12 deficiency.  Repeat levels.

## 2012-10-19 NOTE — Patient Instructions (Addendum)
Continue regular calcium and vitamin D supplementation Continue yearly mammogram Let me know if you change your mind regarding other osteoporosis therapy such as Reclast Monitor weights and be in touch if you have any continued losses or any new symptoms

## 2012-10-20 LAB — VITAMIN D 25 HYDROXY (VIT D DEFICIENCY, FRACTURES): Vit D, 25-Hydroxy: 48 ng/mL (ref 30–89)

## 2012-10-22 NOTE — Progress Notes (Signed)
Quick Note:  Pt informed ______ 

## 2012-11-08 ENCOUNTER — Ambulatory Visit (INDEPENDENT_AMBULATORY_CARE_PROVIDER_SITE_OTHER): Payer: Medicare Other | Admitting: Family Medicine

## 2012-11-08 ENCOUNTER — Ambulatory Visit (INDEPENDENT_AMBULATORY_CARE_PROVIDER_SITE_OTHER)
Admission: RE | Admit: 2012-11-08 | Discharge: 2012-11-08 | Disposition: A | Discharge status: Home / Self Care | Payer: Medicare Other | Source: Ambulatory Visit | Attending: Family Medicine | Admitting: Family Medicine

## 2012-11-08 DIAGNOSIS — R0781 Pleurodynia: Secondary | ICD-10-CM

## 2012-11-08 DIAGNOSIS — R079 Chest pain, unspecified: Secondary | ICD-10-CM

## 2012-11-08 NOTE — Patient Instructions (Addendum)
Follow up promptly for any rash, increased cough or any shortness of breath.

## 2012-11-08 NOTE — Progress Notes (Signed)
  Subjective:    Patient ID: Gloria [name], female    DOB: Jun 05, 1930, 79 y.o.   MRN: [id number]  HPI Patient seen with right anterior lateral rib pain. One-week duration. No reported injury. She describes this as an achy to burning sensation. 6/10 severity. No rash. Symptoms have become more constant. Not exacerbated by movement. She denies any cough or fever. No dyspnea Quit smoking many years ago.  Past Medical History  Diagnosis Date  . Allergy   . B12 deficiency   . Hypertension   . Atrial fibrillation 2011  . GERD (gastroesophageal reflux disease)   . Osteoporosis   . Recurrent UTI   . Esophageal stricture   . Arthritis   . Diverticulosis   . Normocytic anemia   . Chronic epigastric pain     EM Dr. Jarold Motto 2006, low LES pressure, high Demeester, unclear if pt on suppression  . Lumbar stenosis   . Chronic anemia     without evidence of iron deficiency in 2005/2008  . Degenerative disc disease    Past Surgical History  Procedure Laterality Date  . Tonsillectomy    . Abdominal hysterectomy  1969    partial, no cancer  . Appendectomy    . Cholecystectomy  1999  . Esophagogastroduodenoscopy  03/19/10    noncritical appearing Schatzki's ring/small hiatal hernia, adenomatous-appearing mucosa-bx benign  . Esophagogastroduodenoscopy  06/04/09    Dr Cherylynn Ridges  . Colonoscopy  2005    reported Dr Doretha Imus    reports that she quit smoking about 2 years ago. Her smoking use included Cigarettes. She has a 6 pack-year smoking history. She does not have any smokeless tobacco history on file. She reports that she does not drink alcohol or use illicit drugs. family history includes Cancer in her brother; Diabetes in her brother; Hypertension in her father and mother; and Leukemia in her father. Allergies  Allergen Reactions  . Caffeine     REACTION: Heart racing  . Dexlansoprazole     GI upset  . Hydromorphone Hcl   . Ibuprofen     REACTION: Decreased  hearing  . Meperidine Hcl     REACTION: Severe palpatations  . Morphine     REACTION: Hallucinations  . Penicillins     rash  . Propoxyphene Hcl     REACTION: Hallucinations  . Propoxyphene-Acetaminophen     REACTION: Hallucinations  . Sulfamethoxazole W-Trimethoprim     REACTION: Rash      Review of Systems  Constitutional: Negative for fever, chills, appetite change and unexpected weight change.  Respiratory: Negative for cough, shortness of breath and wheezing.   Cardiovascular: Negative for chest pain.  Skin: Negative for rash.       Objective:   Physical Exam  Constitutional: She appears well-developed and well-nourished. No distress.  Cardiovascular: Normal rate and regular rhythm.   Pulmonary/Chest: Effort normal and breath sounds normal. No respiratory distress. She has no wheezes. She has no rales.  Abdominal: Soft. She exhibits no mass. There is no tenderness. There is no rebound and no guarding.  Musculoskeletal:  Patient has some localized tenderness around the sixth rib region and this is around mid axillary line. No visible skin changes.  Skin: No rash noted.          Assessment & Plan:  Right anterior lateral rib pain. No history of injury. Check chest x-ray and right unilateral rib films. She is not requesting anything for pain at this point. Has tried tramadol with minimal relief.

## 2012-11-09 NOTE — Progress Notes (Signed)
Quick Note:  Pt informed ______ 

## 2012-12-17 ENCOUNTER — Encounter: Payer: Self-pay | Admitting: Family Medicine

## 2012-12-17 ENCOUNTER — Ambulatory Visit (INDEPENDENT_AMBULATORY_CARE_PROVIDER_SITE_OTHER)
Admission: RE | Admit: 2012-12-17 | Discharge: 2012-12-17 | Disposition: A | Discharge status: Home / Self Care | Payer: Medicare Other | Source: Ambulatory Visit | Attending: Family Medicine | Admitting: Family Medicine

## 2012-12-17 ENCOUNTER — Ambulatory Visit (INDEPENDENT_AMBULATORY_CARE_PROVIDER_SITE_OTHER): Payer: Medicare Other | Admitting: Family Medicine

## 2012-12-17 VITALS — Temp 98.5°F | Wt 122.0 lb

## 2012-12-17 DIAGNOSIS — M25572 Pain in left ankle and joints of left foot: Secondary | ICD-10-CM

## 2012-12-17 DIAGNOSIS — M25579 Pain in unspecified ankle and joints of unspecified foot: Secondary | ICD-10-CM

## 2012-12-17 NOTE — Progress Notes (Signed)
  Subjective:    Patient ID: Gloria [name], female    DOB: 1930-07-30, 79 y.o.   MRN: [id number]  HPI Acute visit. Patient seen with left ankle pain for one week. No injury. She's had edema and pain laterally. No warmth or erythema. Mild sense of instability occasional with walking. No pain at rest only with walking. She's tried compressive stocking, elevation, and ice without much improvement. She cannot take nonsteroidals secondary to intolerance  Past Medical History  Diagnosis Date  . Allergy   . B12 deficiency   . Hypertension   . Atrial fibrillation 2011  . GERD (gastroesophageal reflux disease)   . Osteoporosis   . Recurrent UTI   . Esophageal stricture   . Arthritis   . Diverticulosis   . Normocytic anemia   . Chronic epigastric pain     EM Dr. Jarold Motto 2006, low LES pressure, high Demeester, unclear if pt on suppression  . Lumbar stenosis   . Chronic anemia     without evidence of iron deficiency in 2005/2008  . Degenerative disc disease    Past Surgical History  Procedure Laterality Date  . Tonsillectomy    . Abdominal hysterectomy  1969    partial, no cancer  . Appendectomy    . Cholecystectomy  1999  . Esophagogastroduodenoscopy  03/19/10    noncritical appearing Schatzki's ring/small hiatal hernia, adenomatous-appearing mucosa-bx benign  . Esophagogastroduodenoscopy  06/04/09    Dr Cherylynn Ridges  . Colonoscopy  2005    reported Dr Doretha Imus    reports that she quit smoking about 2 years ago. Her smoking use included Cigarettes. She has a 6 pack-year smoking history. She does not have any smokeless tobacco history on file. She reports that she does not drink alcohol or use illicit drugs. family history includes Cancer in her brother; Diabetes in her brother; Hypertension in her father and mother; and Leukemia in her father. Allergies  Allergen Reactions  . Caffeine     REACTION: Heart racing  . Dexlansoprazole     GI upset  .  Hydromorphone Hcl   . Ibuprofen     REACTION: Decreased hearing  . Meperidine Hcl     REACTION: Severe palpatations  . Morphine     REACTION: Hallucinations  . Penicillins     rash  . Propoxyphene Hcl     REACTION: Hallucinations  . Propoxyphene-Acetaminophen     REACTION: Hallucinations  . Sulfamethoxazole W-Trimethoprim     REACTION: Rash      Review of Systems  Constitutional: Negative for fever and chills.  Skin: Negative for rash.       Objective:   Physical Exam  Constitutional: She appears well-developed and well-nourished.  Cardiovascular: Normal rate and regular rhythm.   Pulmonary/Chest: Effort normal and breath sounds normal. No respiratory distress. She has no wheezes. She has no rales.  Musculoskeletal:  Left ankle reveals some mild edema laterally over the lateral malleolus and inferior to this region. She does not have any distal fibular tenderness but has some pain just distal to this. She has full range of motion left ankle. No erythema. No warmth. No Achilles tenderness          Assessment & Plan:  Left ankle edema. No history of acute injury. Obtain x-rays. Continue elevation ice. Ace wrap applied Touch base in 2 weeks if not improving

## 2012-12-17 NOTE — Patient Instructions (Addendum)
Continue with ankle elevation, compression, and ice.

## 2012-12-18 NOTE — Progress Notes (Signed)
Quick Note:  Pt informed ______ 

## 2012-12-25 ENCOUNTER — Other Ambulatory Visit: Payer: Self-pay | Admitting: Family Medicine

## 2012-12-25 DIAGNOSIS — Z139 Encounter for screening, unspecified: Secondary | ICD-10-CM

## 2013-01-14 ENCOUNTER — Ambulatory Visit (HOSPITAL_COMMUNITY)
Admission: RE | Admit: 2013-01-14 | Discharge: 2013-01-14 | Disposition: A | Discharge status: Home / Self Care | Payer: Medicare Other | Source: Ambulatory Visit | Attending: Family Medicine | Admitting: Family Medicine

## 2013-01-14 DIAGNOSIS — Z139 Encounter for screening, unspecified: Secondary | ICD-10-CM

## 2013-01-14 DIAGNOSIS — Z1231 Encounter for screening mammogram for malignant neoplasm of breast: Secondary | ICD-10-CM | POA: Insufficient documentation

## 2013-01-28 ENCOUNTER — Ambulatory Visit (INDEPENDENT_AMBULATORY_CARE_PROVIDER_SITE_OTHER): Payer: Medicare Other | Admitting: Family Medicine

## 2013-01-28 ENCOUNTER — Encounter: Payer: Self-pay | Admitting: Family Medicine

## 2013-01-28 VITALS — BP 126/68 | HR 88 | Temp 97.9°F | Ht 60.0 in | Wt 122.0 lb

## 2013-01-28 DIAGNOSIS — N390 Urinary tract infection, site not specified: Secondary | ICD-10-CM

## 2013-01-28 DIAGNOSIS — R300 Dysuria: Secondary | ICD-10-CM

## 2013-01-28 LAB — POCT URINALYSIS DIPSTICK
Ketones, UA: NEGATIVE
Spec Grav, UA: 1.02
Urobilinogen, UA: 0.2

## 2013-01-28 MED ORDER — CEPHALEXIN 500 MG PO CAPS: 500.0000 mg | ORAL_CAPSULE | Freq: Three times a day (TID) | ORAL | Status: DC

## 2013-01-28 NOTE — Progress Notes (Signed)
  Subjective:    Patient ID: Gloria [name], female    DOB: Aug 20, 1929, 79 y.o.   MRN: [id number]  HPI  Acute visit Patient seen with one week history of malaise, urinary frequency, burning with urination, chills but absence of fever. She denies any nausea or vomiting. No flank pain. She has some urinary urgency. Similar symptoms in past with UTI. Denies any abdominal pain. She has allergy to penicillin but has taken Keflex without difficulty in the past.  Past Medical History  Diagnosis Date  . Allergy   . B12 deficiency   . Hypertension   . Atrial fibrillation 2011  . GERD (gastroesophageal reflux disease)   . Osteoporosis   . Recurrent UTI   . Esophageal stricture   . Arthritis   . Diverticulosis   . Normocytic anemia   . Chronic epigastric pain     EM Dr. Jarold Motto 2006, low LES pressure, high Demeester, unclear if pt on suppression  . Lumbar stenosis   . Chronic anemia     without evidence of iron deficiency in 2005/2008  . Degenerative disc disease    Past Surgical History  Procedure Laterality Date  . Tonsillectomy    . Abdominal hysterectomy  1969    partial, no cancer  . Appendectomy    . Cholecystectomy  1999  . Esophagogastroduodenoscopy  03/19/10    noncritical appearing Schatzki's ring/small hiatal hernia, adenomatous-appearing mucosa-bx benign  . Esophagogastroduodenoscopy  06/04/09    Dr Cherylynn Ridges  . Colonoscopy  2005    reported Dr Doretha Imus    reports that she quit smoking about 2 years ago. Her smoking use included Cigarettes. She has a 6 pack-year smoking history. She does not have any smokeless tobacco history on file. She reports that she does not drink alcohol or use illicit drugs. family history includes Cancer in her brother; Diabetes in her brother; Hypertension in her father and mother; and Leukemia in her father. Allergies  Allergen Reactions  . Caffeine     REACTION: Heart racing  . Dexlansoprazole     GI upset  .  Hydromorphone Hcl   . Ibuprofen     REACTION: Decreased hearing  . Meperidine Hcl     REACTION: Severe palpatations  . Morphine     REACTION: Hallucinations  . Penicillins     rash  . Propoxyphene Hcl     REACTION: Hallucinations  . Propoxyphene-Acetaminophen     REACTION: Hallucinations  . Sulfamethoxazole W-Trimethoprim     REACTION: Rash     Review of Systems  Constitutional: Positive for chills and fatigue. Negative for fever and appetite change.  Gastrointestinal: Negative for nausea, vomiting, abdominal pain, diarrhea and constipation.  Genitourinary: Positive for dysuria and frequency.  Musculoskeletal: Negative for back pain.  Neurological: Negative for dizziness.       Objective:   Physical Exam  Constitutional: She appears well-developed and well-nourished.  HENT:  Head: Normocephalic and atraumatic.  Neck: Neck supple. No thyromegaly present.  Cardiovascular: Normal rate, regular rhythm and normal heart sounds.   Pulmonary/Chest: Breath sounds normal.  Abdominal: Soft. Bowel sounds are normal. There is no tenderness.          Assessment & Plan:  Probable uncomplicated cystitis. Urine culture sent. Keflex 500 mg 3 times a day for 7 days.

## 2013-01-28 NOTE — Patient Instructions (Addendum)
Urinary Tract Infection  Urinary tract infections (UTIs) can develop anywhere along your urinary tract. Your urinary tract is your body's drainage system for removing wastes and extra water. Your urinary tract includes two kidneys, two ureters, a bladder, and a urethra. Your kidneys are a pair of bean-shaped organs. Each kidney is about the size of your fist. They are located below your ribs, one on each side of your spine.  CAUSES  Infections are caused by microbes, which are microscopic organisms, including fungi, viruses, and bacteria. These organisms are so small that they can only be seen through a microscope. Bacteria are the microbes that most commonly cause UTIs.  SYMPTOMS   Symptoms of UTIs may vary by age and gender of the patient and by the location of the infection. Symptoms in young women typically include a frequent and intense urge to urinate and a painful, burning feeling in the bladder or urethra during urination. Older women and men are more likely to be tired, shaky, and weak and have muscle aches and abdominal pain. A fever may mean the infection is in your kidneys. Other symptoms of a kidney infection include pain in your back or sides below the ribs, nausea, and vomiting.  DIAGNOSIS  To diagnose a UTI, your caregiver will ask you about your symptoms. Your caregiver also will ask to provide a urine sample. The urine sample will be tested for bacteria and white blood cells. White blood cells are made by your body to help fight infection.  TREATMENT   Typically, UTIs can be treated with medication. Because most UTIs are caused by a bacterial infection, they usually can be treated with the use of antibiotics. The choice of antibiotic and length of treatment depend on your symptoms and the type of bacteria causing your infection.  HOME CARE INSTRUCTIONS   If you were prescribed antibiotics, take them exactly as your caregiver instructs you. Finish the medication even if you feel better after you  have only taken some of the medication.   Drink enough water and fluids to keep your urine clear or pale yellow.   Avoid caffeine, tea, and carbonated beverages. They tend to irritate your bladder.   Empty your bladder often. Avoid holding urine for long periods of time.   Empty your bladder before and after sexual intercourse.   After a bowel movement, women should cleanse from front to back. Use each tissue only once.  SEEK MEDICAL CARE IF:    You have back pain.   You develop a fever.   Your symptoms do not begin to resolve within 3 days.  SEEK IMMEDIATE MEDICAL CARE IF:    You have severe back pain or lower abdominal pain.   You develop chills.   You have nausea or vomiting.   You have continued burning or discomfort with urination.  MAKE SURE YOU:    Understand these instructions.   Will watch your condition.   Will get help right away if you are not doing well or get worse.  Document Released: 04/27/2005 Document Revised: 01/17/2012 Document Reviewed: 08/26/2011  [employer] Patient Information 2014 [employer].

## 2013-01-30 LAB — URINE CULTURE

## 2013-02-14 ENCOUNTER — Ambulatory Visit (INDEPENDENT_AMBULATORY_CARE_PROVIDER_SITE_OTHER): Payer: Medicare Other | Admitting: Family Medicine

## 2013-02-14 ENCOUNTER — Encounter: Payer: Self-pay | Admitting: Family Medicine

## 2013-02-14 VITALS — BP 140/80 | HR 89 | Temp 98.4°F | Wt 121.0 lb

## 2013-02-14 DIAGNOSIS — R300 Dysuria: Secondary | ICD-10-CM

## 2013-02-14 LAB — POCT URINALYSIS DIPSTICK
Glucose, UA: NEGATIVE
Ketones, UA: NEGATIVE
Urobilinogen, UA: 0.2

## 2013-02-14 MED ORDER — CEPHALEXIN 500 MG PO CAPS: 500.0000 mg | ORAL_CAPSULE | Freq: Three times a day (TID) | ORAL | Status: DC

## 2013-02-14 NOTE — Progress Notes (Signed)
  Subjective:    Patient ID: Gloria [name], female    DOB: 01-Dec-1929, 79 y.o.   MRN: [id number]  HPI Acute visit Patient has some persistent dysuria with burning of urination frequency. Recent culture grew out Escherichia coli. Sensitive to first generation cephalosporin with Keflex. She saw some improvement on antibiotic but not fully resolved. Denies any fever or chills No nausea or vomiting. No flank pain. No major suprapubic discomfort Multiple drug intolerances including sulfa and penicillin. She thinks she has had intolerance to Cipro previously  Past Medical History  Diagnosis Date  . Allergy   . B12 deficiency   . Hypertension   . Atrial fibrillation 2011  . GERD (gastroesophageal reflux disease)   . Osteoporosis   . Recurrent UTI   . Esophageal stricture   . Arthritis   . Diverticulosis   . Normocytic anemia   . Chronic epigastric pain     EM Dr. Jarold Motto 2006, low LES pressure, high Demeester, unclear if pt on suppression  . Lumbar stenosis   . Chronic anemia     without evidence of iron deficiency in 2005/2008  . Degenerative disc disease    Past Surgical History  Procedure Laterality Date  . Tonsillectomy    . Abdominal hysterectomy  1969    partial, no cancer  . Appendectomy    . Cholecystectomy  1999  . Esophagogastroduodenoscopy  03/19/10    noncritical appearing Schatzki's ring/small hiatal hernia, adenomatous-appearing mucosa-bx benign  . Esophagogastroduodenoscopy  06/04/09    Dr Cherylynn Ridges  . Colonoscopy  2005    reported Dr Doretha Imus    reports that she quit smoking about 2 years ago. Her smoking use included Cigarettes. She has a 6 pack-year smoking history. She does not have any smokeless tobacco history on file. She reports that she does not drink alcohol or use illicit drugs. family history includes Cancer in her brother; Diabetes in her brother; Hypertension in her father and mother; and Leukemia in her father. Allergies   Allergen Reactions  . Caffeine     REACTION: Heart racing  . Dexlansoprazole     GI upset  . Hydromorphone Hcl   . Ibuprofen     REACTION: Decreased hearing  . Meperidine Hcl     REACTION: Severe palpatations  . Morphine     REACTION: Hallucinations  . Penicillins     rash  . Propoxyphene Hcl     REACTION: Hallucinations  . Propoxyphene-Acetaminophen     REACTION: Hallucinations  . Sulfamethoxazole W-Trimethoprim     REACTION: Rash      Review of Systems  Constitutional: Negative for fever, chills and appetite change.  Gastrointestinal: Negative for nausea, vomiting, abdominal pain, diarrhea and constipation.  Genitourinary: Positive for dysuria and frequency.  Musculoskeletal: Negative for back pain.  Neurological: Negative for dizziness.       Objective:   Physical Exam  Constitutional: She appears well-developed and well-nourished.  Cardiovascular: Normal rate and regular rhythm.   Pulmonary/Chest: Effort normal and breath sounds normal. No respiratory distress. She has no wheezes. She has no rales.  Neurological: She is alert.          Assessment & Plan:  Recurrent dysuria. Recent Escherichia coli UTI. Should have resolved with Keflex. Repeat culture. Start back Keflex 500 mg 3 times a day for 7 days-given multiple drug intolerances and sensitivity to Cefazolin.

## 2013-02-14 NOTE — Patient Instructions (Addendum)
Urinary Tract Infection  Urinary tract infections (UTIs) can develop anywhere along your urinary tract. Your urinary tract is your body's drainage system for removing wastes and extra water. Your urinary tract includes two kidneys, two ureters, a bladder, and a urethra. Your kidneys are a pair of bean-shaped organs. Each kidney is about the size of your fist. They are located below your ribs, one on each side of your spine.  CAUSES  Infections are caused by microbes, which are microscopic organisms, including fungi, viruses, and bacteria. These organisms are so small that they can only be seen through a microscope. Bacteria are the microbes that most commonly cause UTIs.  SYMPTOMS   Symptoms of UTIs may vary by age and gender of the patient and by the location of the infection. Symptoms in young women typically include a frequent and intense urge to urinate and a painful, burning feeling in the bladder or urethra during urination. Older women and men are more likely to be tired, shaky, and weak and have muscle aches and abdominal pain. A fever may mean the infection is in your kidneys. Other symptoms of a kidney infection include pain in your back or sides below the ribs, nausea, and vomiting.  DIAGNOSIS  To diagnose a UTI, your caregiver will ask you about your symptoms. Your caregiver also will ask to provide a urine sample. The urine sample will be tested for bacteria and white blood cells. White blood cells are made by your body to help fight infection.  TREATMENT   Typically, UTIs can be treated with medication. Because most UTIs are caused by a bacterial infection, they usually can be treated with the use of antibiotics. The choice of antibiotic and length of treatment depend on your symptoms and the type of bacteria causing your infection.  HOME CARE INSTRUCTIONS   If you were prescribed antibiotics, take them exactly as your caregiver instructs you. Finish the medication even if you feel better after you  have only taken some of the medication.   Drink enough water and fluids to keep your urine clear or pale yellow.   Avoid caffeine, tea, and carbonated beverages. They tend to irritate your bladder.   Empty your bladder often. Avoid holding urine for long periods of time.   Empty your bladder before and after sexual intercourse.   After a bowel movement, women should cleanse from front to back. Use each tissue only once.  SEEK MEDICAL CARE IF:    You have back pain.   You develop a fever.   Your symptoms do not begin to resolve within 3 days.  SEEK IMMEDIATE MEDICAL CARE IF:    You have severe back pain or lower abdominal pain.   You develop chills.   You have nausea or vomiting.   You have continued burning or discomfort with urination.  MAKE SURE YOU:    Understand these instructions.   Will watch your condition.   Will get help right away if you are not doing well or get worse.  Document Released: 04/27/2005 Document Revised: 01/17/2012 Document Reviewed: 08/26/2011  [employer] Patient Information 2014 [employer].

## 2013-02-17 LAB — URINE CULTURE: Colony Count: >=100000

## 2013-03-07 ENCOUNTER — Ambulatory Visit (INDEPENDENT_AMBULATORY_CARE_PROVIDER_SITE_OTHER): Payer: Medicare Other | Admitting: Family Medicine

## 2013-03-07 ENCOUNTER — Encounter: Payer: Self-pay | Admitting: Family Medicine

## 2013-03-07 VITALS — BP 142/80 | HR 85 | Temp 98.0°F | Wt 120.0 lb

## 2013-03-07 DIAGNOSIS — R300 Dysuria: Secondary | ICD-10-CM

## 2013-03-07 LAB — POCT URINALYSIS DIPSTICK
Ketones, UA: NEGATIVE
Spec Grav, UA: 1.015
Urobilinogen, UA: 0.2
pH, UA: 6.5

## 2013-03-07 MED ORDER — CEPHALEXIN 500 MG PO CAPS: 500.0000 mg | ORAL_CAPSULE | Freq: Three times a day (TID) | ORAL | Status: DC

## 2013-03-07 NOTE — Progress Notes (Signed)
Subjective:    Patient ID: Gloria [name], female    DOB: October 28, 1929, 79 y.o.   MRN: [id number]  HPI Recurrent/persistent dysuria. Patient has a history of frequent UTIs in the past. Back in late June she had positive culture for Escherichia coli which was sensitive to Keflex and did improve following antibiotics. She then presented mid July with again recurrent dysuria with positive culture for Proteus. Again was treated with Keflex. Slight improvement but not resolution of symptoms. Past 2-3 days she's had some burning with urination and frequent urination at night. No fevers. No chills. Denies nausea or vomiting. No flank pain. No gross hematuria  Past Medical History  Diagnosis Date  . Allergy   . B12 deficiency   . Hypertension   . Atrial fibrillation 2011  . GERD (gastroesophageal reflux disease)   . Osteoporosis   . Recurrent UTI   . Esophageal stricture   . Arthritis   . Diverticulosis   . Normocytic anemia   . Chronic epigastric pain     EM Dr. Jarold Motto 2006, low LES pressure, high Demeester, unclear if pt on suppression  . Lumbar stenosis   . Chronic anemia     without evidence of iron deficiency in 2005/2008  . Degenerative disc disease    Past Surgical History  Procedure Laterality Date  . Tonsillectomy    . Abdominal hysterectomy  1969    partial, no cancer  . Appendectomy    . Cholecystectomy  1999  . Esophagogastroduodenoscopy  03/19/10    noncritical appearing Schatzki's ring/small hiatal hernia, adenomatous-appearing mucosa-bx benign  . Esophagogastroduodenoscopy  06/04/09    Dr Cherylynn Ridges  . Colonoscopy  2005    reported Dr Doretha Imus    reports that she quit smoking about 2 years ago. Her smoking use included Cigarettes. She has a 6 pack-year smoking history. She does not have any smokeless tobacco history on file. She reports that she does not drink alcohol or use illicit drugs. family history includes Cancer in her brother; Diabetes  in her brother; Hypertension in her father and mother; and Leukemia in her father. Allergies  Allergen Reactions  . Caffeine     REACTION: Heart racing  . Dexlansoprazole     GI upset  . Hydromorphone Hcl   . Ibuprofen     REACTION: Decreased hearing  . Meperidine Hcl     REACTION: Severe palpatations  . Morphine     REACTION: Hallucinations  . Penicillins     rash  . Propoxyphene Hcl     REACTION: Hallucinations  . Propoxyphene-Acetaminophen     REACTION: Hallucinations  . Sulfamethoxazole W-Trimethoprim     REACTION: Rash      Review of Systems  Constitutional: Negative for fever and chills.  Gastrointestinal: Negative for nausea and vomiting.  Genitourinary: Positive for dysuria and urgency. Negative for hematuria and flank pain.  Musculoskeletal: Negative for back pain.       Objective:   Physical Exam  Constitutional: She appears well-developed and well-nourished.  Cardiovascular: Normal rate and regular rhythm.   Pulmonary/Chest: Effort normal and breath sounds normal. No respiratory distress. She has no wheezes. She has no rales.          Assessment & Plan:  Dysuria. We've considered issues like colonization but since she's had different pathogens this is less likely. Current symptoms could represent recurrent UTI versus issues with atrophic vaginitis vs urine urgency vs other. Unable to give urine sample currently. We'll check repeat urinalysis and if indicated  urine culture.  Urinalysis suggest recurrent UTI. Urine culture sent. Keflex 500 mg 3 times a day pending culture results. If negative, consider medication for urinary urgency. She's not drinking any caffeine or other likely irritants at night

## 2013-03-12 ENCOUNTER — Other Ambulatory Visit: Payer: Self-pay

## 2013-03-12 MED ORDER — CEPHALEXIN 250 MG PO CAPS: 250.0000 mg | ORAL_CAPSULE | Freq: Every day | ORAL | Status: DC

## 2013-04-24 ENCOUNTER — Encounter: Payer: Self-pay | Admitting: Family Medicine

## 2013-04-24 ENCOUNTER — Ambulatory Visit (INDEPENDENT_AMBULATORY_CARE_PROVIDER_SITE_OTHER): Payer: Medicare Other | Admitting: Family Medicine

## 2013-04-24 VITALS — BP 130/78 | HR 83 | Temp 97.8°F | Wt 123.0 lb

## 2013-04-24 DIAGNOSIS — E538 Deficiency of other specified B group vitamins: Secondary | ICD-10-CM

## 2013-04-24 DIAGNOSIS — R5383 Other fatigue: Secondary | ICD-10-CM

## 2013-04-24 DIAGNOSIS — I10 Essential (primary) hypertension: Secondary | ICD-10-CM

## 2013-04-24 DIAGNOSIS — R5381 Other malaise: Secondary | ICD-10-CM

## 2013-04-24 DIAGNOSIS — D539 Nutritional anemia, unspecified: Secondary | ICD-10-CM

## 2013-04-24 LAB — BASIC METABOLIC PANEL
CO2: 23 mEq/L (ref 19–32)
Glucose, Bld: 87 mg/dL (ref 70–99)
Potassium: 4.8 mEq/L (ref 3.5–5.1)
Sodium: 135 mEq/L (ref 135–145)

## 2013-04-24 LAB — CBC WITH DIFFERENTIAL/PLATELET
Basophils Absolute: 0 10*3/uL (ref 0.0–0.1)
Eosinophils Absolute: 0 10*3/uL (ref 0.0–0.7)
HCT: 25.9 % — ABNORMAL LOW (ref 36.0–46.0)
Hemoglobin: 8.8 g/dL — ABNORMAL LOW (ref 12.0–15.0)
Lymphs Abs: 1.4 10*3/uL (ref 0.7–4.0)
MCHC: 33.8 g/dL (ref 30.0–36.0)
MCV: 87.4 fl (ref 78.0–100.0)
Monocytes Absolute: 0.4 10*3/uL (ref 0.1–1.0)
Monocytes Relative: 9.6 % (ref 3.0–12.0)
Neutro Abs: 2.7 10*3/uL (ref 1.4–7.7)
RDW: 13.7 % (ref 11.5–14.6)

## 2013-04-24 MED ORDER — CYANOCOBALAMIN 1000 MCG/ML IJ SOLN
1000.0000 ug | Freq: Once | INTRAMUSCULAR | Status: AC
  Administered 2013-04-24: 1000 ug via INTRAMUSCULAR

## 2013-04-24 NOTE — Progress Notes (Signed)
Subjective:    Patient ID: Gloria [name], female    DOB: 1930-03-24, 79 y.o.   MRN: [id number]  HPI Patient seen with chief complaint of increased fatigue. This has been somewhat of a chronic complaint. She has chronic problems including history of hypertension, osteoporosis, diverticulosis, GERD, lumbar stenosis and chronic normocytic anemia. She has chronic sleep difficulties and she attributes to nocturia. She has urinary urgency but refuses take other medications. No obstructive urinary symptoms. Generally gets about 5 hours sleep per night but is broken up. No recent appetite or weight changes. She is also reluctant to consider medications for insomnia. She has never tried Benadryl or melatonin.  Chronic anemia with baseline hemoglobin around 9. No recent dizziness. No orthostasis. Patient had recent labs in March including thyroid functions which were normal. She has had some stress issues and realizes maybe causing some of her fatigue. Denies any exacerbation and depression  Past Medical History  Diagnosis Date  . Allergy   . B12 deficiency   . Hypertension   . Atrial fibrillation 2011  . GERD (gastroesophageal reflux disease)   . Osteoporosis   . Recurrent UTI   . Esophageal stricture   . Arthritis   . Diverticulosis   . Normocytic anemia   . Chronic epigastric pain     EM Dr. Jarold Motto 2006, low LES pressure, high Demeester, unclear if pt on suppression  . Lumbar stenosis   . Chronic anemia     without evidence of iron deficiency in 2005/2008  . Degenerative disc disease    Past Surgical History  Procedure Laterality Date  . Tonsillectomy    . Abdominal hysterectomy  1969    partial, no cancer  . Appendectomy    . Cholecystectomy  1999  . Esophagogastroduodenoscopy  03/19/10    noncritical appearing Schatzki's ring/small hiatal hernia, adenomatous-appearing mucosa-bx benign  . Esophagogastroduodenoscopy  06/04/09    Dr Cherylynn Ridges  . Colonoscopy  2005    reported  Dr Doretha Imus    reports that she quit smoking about 2 years ago. Her smoking use included Cigarettes. She has a 6 pack-year smoking history. She does not have any smokeless tobacco history on file. She reports that she does not drink alcohol or use illicit drugs. family history includes Cancer in her brother; Diabetes in her brother; Hypertension in her father and mother; Leukemia in her father. Allergies  Allergen Reactions  . Caffeine     REACTION: Heart racing  . Dexlansoprazole     GI upset  . Hydromorphone Hcl   . Ibuprofen     REACTION: Decreased hearing  . Meperidine Hcl     REACTION: Severe palpatations  . Morphine     REACTION: Hallucinations  . Penicillins     rash  . Propoxyphene Hcl     REACTION: Hallucinations  . Propoxyphene-Acetaminophen     REACTION: Hallucinations  . Sulfamethoxazole W-Trimethoprim     REACTION: Rash      Review of Systems  Constitutional: Positive for fatigue. Negative for fever, chills, appetite change and unexpected weight change.  Eyes: Negative for visual disturbance.  Respiratory: Negative for cough, chest tightness, shortness of breath and wheezing.   Cardiovascular: Negative for chest pain, palpitations and leg swelling.  Gastrointestinal: Negative for abdominal pain.  Endocrine: Negative for polydipsia and polyuria.  Genitourinary: Negative for dysuria.  Neurological: Negative for dizziness, seizures, syncope, weakness, light-headedness and headaches.  Hematological: Negative for adenopathy.  Psychiatric/Behavioral: Negative for dysphoric mood.  Objective:   Physical Exam  Constitutional: She appears well-developed and well-nourished. No distress.  HENT:  Mouth/Throat: Oropharynx is clear and moist.  Neck: Neck supple. No thyromegaly present.  Cardiovascular: Normal rate and regular rhythm.   Pulmonary/Chest: Effort normal and breath sounds normal. No respiratory distress. She has no wheezes. She has no  rales.  Musculoskeletal: She exhibits no edema.  Skin: No rash noted.  Psychiatric: She has a normal mood and affect. Her behavior is normal.          Assessment & Plan:  #1 fatigue. Likely multifactorial. Suspect related to chronic anemia, stress issues, insomnia. B12 injection given. Check labs - CBC basic metabolic panel #2 hypertension. Stable. Continue current medication #3 health maintenance. Flu vaccine recommended and offered and patient refuses #4 chronic anemia.  Repeat CBC.

## 2013-05-17 ENCOUNTER — Other Ambulatory Visit: Payer: Self-pay

## 2013-05-17 MED ORDER — ESOMEPRAZOLE MAGNESIUM 40 MG PO CPDR: 40.0000 mg | DELAYED_RELEASE_CAPSULE | Freq: Two times a day (BID) | ORAL | Status: DC

## 2013-05-31 ENCOUNTER — Encounter (INDEPENDENT_AMBULATORY_CARE_PROVIDER_SITE_OTHER): Payer: Self-pay

## 2013-05-31 ENCOUNTER — Encounter: Payer: Self-pay | Admitting: Internal Medicine

## 2013-05-31 ENCOUNTER — Other Ambulatory Visit: Payer: Self-pay | Admitting: Internal Medicine

## 2013-05-31 ENCOUNTER — Ambulatory Visit (INDEPENDENT_AMBULATORY_CARE_PROVIDER_SITE_OTHER): Payer: Medicare Other | Admitting: Internal Medicine

## 2013-05-31 VITALS — BP 136/77 | HR 83 | Temp 98.4°F | Wt 121.8 lb

## 2013-05-31 DIAGNOSIS — R1319 Other dysphagia: Secondary | ICD-10-CM

## 2013-05-31 NOTE — Patient Instructions (Signed)
Schedule EGD with esophageal dilation  Continue Nexium 40 mg twice daily

## 2013-05-31 NOTE — Progress Notes (Signed)
Primary Care Physician:  Kristian Covey, MD Primary Gastroenterologist:  Dr.   Pre-Procedure History & Physical: HPI:  Gloria [name] is a 79 y.o. female here for evaluation of recurrent esophageal dysphagia. Schatzki's ring dilated by Dr. Faustino Congress 2010. She's done well until the past several months when she's developed insidiously recurrent esophageal dysphagia. GERD symptoms well controlled on Nexium 40 mg twice a day. If she tries to drop back to once daily she has recurrent frequent symptoms. No melena neuro is satiety nausea or vomiting. She denies weight loss. She was here 2 years ago for abdominal pain CT negative. Mildly elevated lipase and amylase at that time. Those symptoms resolved. She never returned Hemoccults.  Past Medical History  Diagnosis Date  . Allergy   . B12 deficiency   . Hypertension   . Atrial fibrillation 2011  . GERD (gastroesophageal reflux disease)   . Osteoporosis   . Recurrent UTI   . Esophageal stricture   . Arthritis   . Diverticulosis   . Normocytic anemia   . Chronic epigastric pain     EM Dr. Jarold Motto 2006, low LES pressure, high Demeester, unclear if pt on suppression  . Lumbar stenosis   . Chronic anemia     without evidence of iron deficiency in 2005/2008  . Degenerative disc disease     Past Surgical History  Procedure Laterality Date  . Tonsillectomy    . Abdominal hysterectomy  1969    partial, no cancer  . Appendectomy    . Cholecystectomy  1999  . Esophagogastroduodenoscopy  03/19/10    noncritical appearing Schatzki's ring/small hiatal hernia, adenomatous-appearing mucosa-bx benign  . Esophagogastroduodenoscopy  06/04/09    Dr Cherylynn Ridges  . Colonoscopy  2005    reported Dr Doretha Imus    Prior to Admission medications   Medication Sig Start Date End Date Taking? Authorizing Provider  calcium-vitamin D 250-100 MG-UNIT per tablet Take 1 tablet by mouth 2 (two) times daily.     Yes Historical Provider, MD   Cholecalciferol (VITAMIN D3) 3000 UNITS TABS Take by mouth daily.     Yes Historical Provider, MD  cyanocobalamin (,VITAMIN B-12,) 1000 MCG/ML injection USE ONE ML ONCE MONTHLY 08/28/12  Yes Kristian Covey, MD  esomeprazole (NEXIUM) 40 MG capsule Take 1 capsule (40 mg total) by mouth 2 (two) times daily. 05/17/13  Yes Kristian Covey, MD  ferrous sulfate 325 (65 FE) MG tablet Take 325 mg by mouth daily with breakfast.     Yes Historical Provider, MD  traMADol (ULTRAM) 50 MG tablet Take 50 mg by mouth. 1/2 tab in the AM, 1 tab in the PM Per Dr Trey Sailors 09/17/12  Yes Historical Provider, MD  vitamin C (ASCORBIC ACID) 500 MG tablet Take 500 mg by mouth daily.     Yes Historical Provider, MD  SYRINGE-NEEDLE, DISP, 3 ML (MONOJECT 3CC SYR 25GX1") 25G X 1" 3 ML MISC IM injection 1 ml monthly 04/25/12   Kristian Covey, MD  triamcinolone cream (KENALOG) 0.1 % Apply topically 2 (two) times daily as needed. 01/25/11   Kristian Covey, MD    Allergies as of 05/31/2013 - Review Complete 05/31/2013  Allergen Reaction Noted  . Caffeine  02/05/2007  . Dexlansoprazole  06/21/2011  . Hydromorphone hcl  06/11/2010  . Ibuprofen  02/05/2007  . Meperidine hcl  02/05/2007  . Morphine  02/05/2007  . Penicillins  10/21/2008  . Propoxyphene hcl  02/05/2007  . Propoxyphene-acetaminophen  02/05/2007  . Sulfamethoxazole-trimethoprim  05/02/2007    Family History  Problem Relation Age of Onset  . Hypertension Mother   . Hypertension Father   . Leukemia Father   . Diabetes Brother   . Cancer Brother     melanoma,bone, metastatic unk origin    History   Social History  . Marital Status: Widowed    Spouse Name: N/A    Number of Children: 1  . Years of Education: N/A   Occupational History  . retired; Ship broker    Social History Main Topics  . Smoking status: Former Smoker -- 0.30 packs/day for 20 years    Types: Cigarettes    Quit date: 09/22/2010  . Smokeless tobacco: Not on file  . Alcohol  Use: No  . Drug Use: No  . Sexual Activity: Not on file   Other Topics Concern  . Not on file   Social History Narrative   1 daughter lives nearby   Pt lives alone    Review of Systems: See HPI, otherwise negative ROS  Physical Exam: BP 136/77  Pulse 83  Temp(Src) 98.4 F (36.9 C) (Oral)  Wt 121 lb 12.8 oz (55.248 kg)  BMI 23.79 kg/m2 General:   Frail, kyphotic elderly lady alert conversant and pleasant. No acute distress.  Skin:  Intact without significant lesions or rashes. Eyes:  Sclera clear, no icterus.   Conjunctiva pink. Ears:  Normal auditory acuity. Nose:  No deformity, discharge,  or lesions. Mouth:  No deformity or lesions. Neck:  Supple; no masses or thyromegaly. No significant cervical adenopathy. Lungs:  Clear throughout to auscultation.   No wheezes, crackles, or rhonchi. No acute distress. Heart:  Regular rate and rhythm; no murmurs, clicks, rubs,  or gallops. Abdomen: Non-distended, normal bowel sounds.  Soft and nontender without appreciable mass or hepatosplenomegaly.  Pulses:  Normal pulses noted. Extremities:  Without clubbing or edema.  Impression:    79 year old lady with a known Schatzki's ring now with recurrent esophageal dysphagia. GERD symptoms well controlled on twice a day proton pump inhibitor therapy. She likely has had recurrent tightening of her ring.  She will probably benefit from esophageal dilation.   Recommendations:    Plan for EGD with esophageal dilation in the near future.The risks, benefits, limitations, alternatives and imponderables have been reviewed with the patient. Potential for esophageal dilation, biopsy, etc. have also been reviewed.  Questions have been answered. All parties agreeable.  For now, recommend she continue twice a day proton pump inhibitor therapy as the benefits appear to outweigh the risks.

## 2013-06-03 ENCOUNTER — Encounter (HOSPITAL_COMMUNITY): Payer: Self-pay | Admitting: *Deleted

## 2013-06-03 ENCOUNTER — Ambulatory Visit (HOSPITAL_COMMUNITY)
Admission: RE | Admit: 2013-06-03 | Discharge: 2013-06-03 | Disposition: A | Discharge status: Home / Self Care | Payer: Medicare Other | Source: Ambulatory Visit | Attending: Internal Medicine | Admitting: Internal Medicine

## 2013-06-03 ENCOUNTER — Encounter (HOSPITAL_COMMUNITY)
Admission: RE | Disposition: A | Discharge status: Home / Self Care | Payer: Self-pay | Source: Ambulatory Visit | Attending: Internal Medicine

## 2013-06-03 DIAGNOSIS — K449 Diaphragmatic hernia without obstruction or gangrene: Secondary | ICD-10-CM

## 2013-06-03 DIAGNOSIS — R1310 Dysphagia, unspecified: Secondary | ICD-10-CM | POA: Insufficient documentation

## 2013-06-03 DIAGNOSIS — I10 Essential (primary) hypertension: Secondary | ICD-10-CM | POA: Insufficient documentation

## 2013-06-03 DIAGNOSIS — R1319 Other dysphagia: Secondary | ICD-10-CM

## 2013-06-03 DIAGNOSIS — K222 Esophageal obstruction: Secondary | ICD-10-CM | POA: Insufficient documentation

## 2013-06-03 HISTORY — PX: ESOPHAGOGASTRODUODENOSCOPY (EGD) WITH ESOPHAGEAL DILATION: SHX5812

## 2013-06-03 SURGERY — ESOPHAGOGASTRODUODENOSCOPY (EGD) WITH ESOPHAGEAL DILATION
Anesthesia: Moderate Sedation

## 2013-06-03 MED ORDER — MIDAZOLAM HCL 5 MG/5ML IJ SOLN
INTRAMUSCULAR | Status: DC | PRN
  Administered 2013-06-03: 2 mg via INTRAVENOUS
  Administered 2013-06-03: 1 mg via INTRAVENOUS

## 2013-06-03 MED ORDER — SODIUM CHLORIDE 0.9 % IV SOLN
INTRAVENOUS | Status: DC
  Administered 2013-06-03: 10:00:00 via INTRAVENOUS

## 2013-06-03 MED ORDER — STERILE WATER FOR IRRIGATION IR SOLN
Status: DC | PRN
  Administered 2013-06-03: 11:00:00

## 2013-06-03 MED ORDER — BUTAMBEN-TETRACAINE-BENZOCAINE 2-2-14 % EX AERO
INHALATION_SPRAY | CUTANEOUS | Status: DC | PRN
Start: 2013-06-03 — End: 2013-06-03
  Administered 2013-06-03: 2 via TOPICAL

## 2013-06-03 MED ORDER — ONDANSETRON HCL 4 MG/2ML IJ SOLN
INTRAMUSCULAR | Status: DC | PRN
  Administered 2013-06-03: 4 mg via INTRAVENOUS

## 2013-06-03 MED ORDER — ONDANSETRON HCL 4 MG/2ML IJ SOLN
INTRAMUSCULAR | Status: AC
  Filled 2013-06-03: qty 2

## 2013-06-03 MED ORDER — FENTANYL CITRATE 0.05 MG/ML IJ SOLN
INTRAMUSCULAR | Status: AC
  Filled 2013-06-03: qty 2

## 2013-06-03 MED ORDER — MEPERIDINE HCL 100 MG/ML IJ SOLN
INTRAMUSCULAR | Status: AC
  Filled 2013-06-03: qty 2

## 2013-06-03 MED ORDER — MIDAZOLAM HCL 5 MG/5ML IJ SOLN
INTRAMUSCULAR | Status: AC
  Filled 2013-06-03: qty 10

## 2013-06-03 MED ORDER — FENTANYL CITRATE 0.05 MG/ML IJ SOLN
INTRAMUSCULAR | Status: DC | PRN
  Administered 2013-06-03: 50 ug via INTRAVENOUS
  Administered 2013-06-03: 25 ug via INTRAVENOUS

## 2013-06-03 NOTE — Op Note (Signed)
[facility] [address] [place], [address]   ENDOSCOPY PROCEDURE REPORT  PATIENT: Gloria [name], Gloria [name]  MR#: [id number] BIRTHDATE: Jan 19, 1930 , 83  yrs. old GENDER: Female ENDOSCOPIST: R.  Roetta Sessions, MD FACP FACG REFERRED BY:  Evelena Peat, M.D. PROCEDURE DATE:  06/03/2013 PROCEDURE:     EGD with Elease Hashimoto dilation  INDICATIONS:     Recurrent esophageal dysphagia;  INFORMED CONSENT:   The risks, benefits, limitations, alternatives and imponderables have been discussed.  The potential for biopsy, esophogeal dilation, etc. have also been reviewed.  Questions have been answered.  All parties agreeable.  Please see the history and physical in the medical record for more information.  MEDICATIONS:    Versed 3 mg IV and fentanyl 75 mcg IV in divided doses. Zofran 4 mg IV  DESCRIPTION OF PROCEDURE:   The XB-1478G ([id number])  endoscope was introduced through the mouth and advanced to the second portion of the duodenum without difficulty or limitations.  The mucosal surfaces were surveyed very carefully during advancement of the scope and upon withdrawal.  Retroflexion view of the proximal stomach and esophagogastric junction was performed.      FINDINGS: Noncritical. Schatzki's ring; otherwise, normal appearing esophageal mucosa. Stomach empty. Small hiatal hernia. Abnormal gastric mucosa. Patent pylorus. Normal first and second portion of the duodenum.  THERAPEUTIC / DIAGNOSTIC MANEUVERS PERFORMED:  A 56 French Maloney dilator was passed to full insertion easily. A look back revealed the ring had  been partially disrupted. Didn't feel a larger bore dilation would be in her best interest. Subsequently, I took 3 quadrant "bites" of the ring with the biopsy forceps to disrupt it additionally.  This was done effectivelyand  without apparent complication.   COMPLICATIONS:  None  IMPRESSION:    Schatzki's ring-status post dilation/disruption as described  above. Small hiatal hernia  RECOMMENDATIONS:     Continue Nexium. Gradually resume regular diet. Office visit with [place] in one year.    _______________________________ R. Roetta Sessions, MD FACP [facility] eSigned:  R. Roetta Sessions, MD FACP [facility] 06/03/2013 11:03 AM     CC:

## 2013-06-03 NOTE — H&P (View-Only) (Signed)
Primary Care Physician:  BURCHETTE,[name], MD Primary Gastroenterologist:  Dr.   Pre-Procedure History & Physical: HPI:  Gloria [name] is a 79 y.o. female here for evaluation of recurrent esophageal dysphagia. Schatzki's ring dilated by Dr. Gaster 2010. She's done well until the past several months when she's developed insidiously recurrent esophageal dysphagia. GERD symptoms well controlled on Nexium 40 mg twice a day. If she tries to drop back to once daily she has recurrent frequent symptoms. No melena neuro is satiety nausea or vomiting. She denies weight loss. She was here 2 years ago for abdominal pain CT negative. Mildly elevated lipase and amylase at that time. Those symptoms resolved. She never returned Hemoccults.  Past Medical History  Diagnosis Date  . Allergy   . B12 deficiency   . Hypertension   . Atrial fibrillation 2011  . GERD (gastroesophageal reflux disease)   . Osteoporosis   . Recurrent UTI   . Esophageal stricture   . Arthritis   . Diverticulosis   . Normocytic anemia   . Chronic epigastric pain     EM Dr. Patterson 2006, low LES pressure, high Demeester, unclear if pt on suppression  . Lumbar stenosis   . Chronic anemia     without evidence of iron deficiency in 2005/2008  . Degenerative disc disease     Past Surgical History  Procedure Laterality Date  . Tonsillectomy    . Abdominal hysterectomy  1969    partial, no cancer  . Appendectomy    . Cholecystectomy  1999  . Esophagogastroduodenoscopy  03/19/10    noncritical appearing Schatzki's ring/small hiatal hernia, adenomatous-appearing mucosa-bx benign  . Esophagogastroduodenoscopy  06/04/09    Dr Gessner-dilation  . Colonoscopy  2005    reported Dr Patterson-diverticulosis    Prior to Admission medications   Medication Sig Start Date End Date Taking? Authorizing Provider  calcium-vitamin D 250-100 MG-UNIT per tablet Take 1 tablet by mouth 2 (two) times daily.     Yes Historical Provider, MD   Cholecalciferol (VITAMIN D3) 3000 UNITS TABS Take by mouth daily.     Yes Historical Provider, MD  cyanocobalamin (,VITAMIN B-12,) 1000 MCG/ML injection USE ONE ML ONCE MONTHLY 08/28/12  Yes [name] Burchette, MD  esomeprazole (NEXIUM) 40 MG capsule Take 1 capsule (40 mg total) by mouth 2 (two) times daily. 05/17/13  Yes [name] Burchette, MD  ferrous sulfate 325 (65 FE) MG tablet Take 325 mg by mouth daily with breakfast.     Yes Historical Provider, MD  traMADol (ULTRAM) 50 MG tablet Take 50 mg by mouth. 1/2 tab in the AM, 1 tab in the PM Per Dr Mark Roy 09/17/12  Yes Historical Provider, MD  vitamin C (ASCORBIC ACID) 500 MG tablet Take 500 mg by mouth daily.     Yes Historical Provider, MD  SYRINGE-NEEDLE, DISP, 3 ML (MONOJECT 3CC SYR 25GX1") 25G X 1" 3 ML MISC IM injection 1 ml monthly 04/25/12   [name] Burchette, MD  triamcinolone cream (KENALOG) 0.1 % Apply topically 2 (two) times daily as needed. 01/25/11   [name] Burchette, MD    Allergies as of 05/31/2013 - Review Complete 05/31/2013  Allergen Reaction Noted  . Caffeine  02/05/2007  . Dexlansoprazole  06/21/2011  . Hydromorphone hcl  06/11/2010  . Ibuprofen  02/05/2007  . Meperidine hcl  02/05/2007  . Morphine  02/05/2007  . Penicillins  10/21/2008  . Propoxyphene hcl  02/05/2007  . Propoxyphene-acetaminophen  02/05/2007  . Sulfamethoxazole-trimethoprim    05/02/2007    Family History  Problem Relation Age of Onset  . Hypertension Mother   . Hypertension Father   . Leukemia Father   . Diabetes Brother   . Cancer Brother     melanoma,bone, metastatic unk origin    History   Social History  . Marital Status: Widowed    Spouse Name: N/A    Number of Children: 1  . Years of Education: N/A   Occupational History  . retired; [employer]    Social History Main Topics  . Smoking status: Former Smoker -- 0.30 packs/day for 20 years    Types: Cigarettes    Quit date: 09/22/2010  . Smokeless tobacco: Not on file  . Alcohol  Use: No  . Drug Use: No  . Sexual Activity: Not on file   Other Topics Concern  . Not on file   Social History Narrative   1 daughter lives nearby   Pt lives alone    Review of Systems: See HPI, otherwise negative ROS  Physical Exam: BP 136/77  Pulse 83  Temp(Src) 98.4 F (36.9 C) (Oral)  Wt 121 lb 12.8 oz (55.248 kg)  BMI 23.79 kg/m2 General:   Frail, kyphotic elderly lady alert conversant and pleasant. No acute distress.  Skin:  Intact without significant lesions or rashes. Eyes:  Sclera clear, no icterus.   Conjunctiva pink. Ears:  Normal auditory acuity. Nose:  No deformity, discharge,  or lesions. Mouth:  No deformity or lesions. Neck:  Supple; no masses or thyromegaly. No significant cervical adenopathy. Lungs:  Clear throughout to auscultation.   No wheezes, crackles, or rhonchi. No acute distress. Heart:  Regular rate and rhythm; no murmurs, clicks, rubs,  or gallops. Abdomen: Non-distended, normal bowel sounds.  Soft and nontender without appreciable mass or hepatosplenomegaly.  Pulses:  Normal pulses noted. Extremities:  Without clubbing or edema.  Impression:    79-year-old lady with a known Schatzki's ring now with recurrent esophageal dysphagia. GERD symptoms well controlled on twice a day proton pump inhibitor therapy. She likely has had recurrent tightening of her ring.  She will probably benefit from esophageal dilation.   Recommendations:    Plan for EGD with esophageal dilation in the near future.The risks, benefits, limitations, alternatives and imponderables have been reviewed with the patient. Potential for esophageal dilation, biopsy, etc. have also been reviewed.  Questions have been answered. All parties agreeable.  For now, recommend she continue twice a day proton pump inhibitor therapy as the benefits appear to outweigh the risks. 

## 2013-06-03 NOTE — Interval H&P Note (Signed)
History and Physical Interval Note:  06/03/2013 10:30 AM  Gloria [name]  has presented today for surgery, with the diagnosis of DYSPHAGIA  The various methods of treatment have been discussed with the patient and family. After consideration of risks, benefits and other options for treatment, the patient has consented to  Procedure(s) with comments: ESOPHAGOGASTRODUODENOSCOPY (EGD) WITH ESOPHAGEAL DILATION (N/A) - 2:30 as a surgical intervention .  The patient's history has been reviewed, patient examined, no change in status, stable for surgery.  I have reviewed the patient's chart and labs.  Questions were answered to the patient's satisfaction.      No change. EGD with dilation as appropriate.The risks, benefits, limitations, alternatives and imponderables have been reviewed with the patient. Potential for esophageal dilation, biopsy, etc. have also been reviewed.  Questions have been answered. All parties agreeable.  Eula Listen

## 2013-06-10 ENCOUNTER — Encounter (HOSPITAL_COMMUNITY): Payer: Self-pay | Admitting: Internal Medicine

## 2013-06-26 ENCOUNTER — Other Ambulatory Visit: Payer: Self-pay | Admitting: Family Medicine

## 2013-06-26 MED ORDER — CYANOCOBALAMIN 1000 MCG/ML IJ SOLN: INTRAMUSCULAR | Status: DC

## 2013-06-26 NOTE — Telephone Encounter (Signed)
Pt would like refills for 1 yrs

## 2013-06-26 NOTE — Addendum Note (Signed)
Addended by: Thomasena Edis on: 06/26/2013 04:11 PM   Modules accepted: Orders

## 2013-07-03 ENCOUNTER — Telehealth: Payer: Self-pay | Admitting: Family Medicine

## 2013-07-03 NOTE — Telephone Encounter (Signed)
Pt stated her plan no longer covers nexium. Pt would like to try protonix call into [facility]

## 2013-07-04 NOTE — Telephone Encounter (Signed)
Stop Nexium and start Protonix 40 mg once daily

## 2013-07-05 MED ORDER — PANTOPRAZOLE SODIUM 40 MG PO TBEC: 40.0000 mg | DELAYED_RELEASE_TABLET | Freq: Every day | ORAL | Status: DC

## 2013-07-05 NOTE — Telephone Encounter (Signed)
Sent new RX to pharmacy and pt is informed

## 2013-08-09 ENCOUNTER — Encounter: Payer: Self-pay | Admitting: Family Medicine

## 2013-08-09 ENCOUNTER — Ambulatory Visit (INDEPENDENT_AMBULATORY_CARE_PROVIDER_SITE_OTHER): Payer: Managed Care, Other (non HMO) | Admitting: Family Medicine

## 2013-08-09 VITALS — BP 130/70 | HR 81 | Temp 97.6°F | Wt 123.0 lb

## 2013-08-09 DIAGNOSIS — R300 Dysuria: Secondary | ICD-10-CM

## 2013-08-09 DIAGNOSIS — N390 Urinary tract infection, site not specified: Secondary | ICD-10-CM

## 2013-08-09 LAB — POCT URINALYSIS DIPSTICK
BILIRUBIN UA: NEGATIVE
Glucose, UA: NEGATIVE
KETONES UA: NEGATIVE
Nitrite, UA: POSITIVE
SPEC GRAV UA: 1.02
Urobilinogen, UA: 0.2
pH, UA: 6.5

## 2013-08-09 MED ORDER — CEPHALEXIN 500 MG PO CAPS: 500.0000 mg | ORAL_CAPSULE | Freq: Three times a day (TID) | ORAL | Status: DC

## 2013-08-09 NOTE — Progress Notes (Signed)
Pre visit review using our clinic review tool, if applicable. No additional management support is needed unless otherwise documented below in the visit note. 

## 2013-08-09 NOTE — Progress Notes (Signed)
Subjective:    Patient ID: Gloria [name], female    DOB: 09/10/1929, 80 y.o.   MRN: [id number]  HPI Patient is seen with complaints of one-week history of intermittent dysuria. Frequent UTIs in the past. We actually placed her on prophylactic antibiotic last summer She only took this for about one month. She complains of intermittent burning with urination and some urinary frequency. No gross hematuria. No back pain. No nausea or vomiting. She has allergies to sulfa. Last 2 infections grew out Proteus mirabilis which were resistant to Macrobid.  Past Medical History  Diagnosis Date  . Allergy   . B12 deficiency   . Hypertension   . Atrial fibrillation 2011  . GERD (gastroesophageal reflux disease)   . Osteoporosis   . Recurrent UTI   . Esophageal stricture   . Arthritis   . Diverticulosis   . Normocytic anemia   . Chronic epigastric pain     EM Dr. Jarold MottoPatterson 2006, low LES pressure, high Demeester, unclear if pt on suppression  . Lumbar stenosis   . Chronic anemia     without evidence of iron deficiency in 2005/2008  . Degenerative disc disease    Past Surgical History  Procedure Laterality Date  . Tonsillectomy    . Abdominal hysterectomy  1969    partial, no cancer  . Appendectomy    . Cholecystectomy  1999  . Esophagogastroduodenoscopy  03/19/10    noncritical appearing Schatzki's ring/small hiatal hernia, adenomatous-appearing mucosa-bx benign  . Esophagogastroduodenoscopy  06/04/09    Dr Cherylynn RidgesGessner-dilation  . Colonoscopy  2005    reported Dr Doretha ImusPatterson-diverticulosis  . Esophagogastroduodenoscopy (egd) with esophageal dilation N/A 06/03/2013    Procedure: ESOPHAGOGASTRODUODENOSCOPY (EGD) WITH ESOPHAGEAL DILATION;  Surgeon: Corbin Adeobert M Rourk, MD;  Location: [facility];  Service: Endoscopy;  Laterality: N/A;  2:30    reports that she quit smoking about 2 years ago. Her smoking use included Cigarettes. She has a 6 pack-year smoking history. She does not have any smokeless  tobacco history on file. She reports that she does not drink alcohol or use illicit drugs. family history includes Cancer in her brother; Diabetes in her brother; Hypertension in her father and mother; Leukemia in her father. Allergies  Allergen Reactions  . Caffeine     REACTION: Heart racing  . Dexlansoprazole     GI upset  . Hydromorphone Hcl   . Ibuprofen     REACTION: Decreased hearing  . Meperidine Hcl     REACTION: Severe palpatations  . Morphine     REACTION: Hallucinations  . Penicillins     rash  . Propoxyphene Hcl     REACTION: Hallucinations  . Propoxyphene N-Acetaminophen     REACTION: Hallucinations  . Sulfamethoxazole-Trimethoprim     REACTION: Rash      Review of Systems  Constitutional: Negative for fever, chills and appetite change.  Gastrointestinal: Negative for nausea, vomiting, abdominal pain, diarrhea and constipation.  Genitourinary: Positive for dysuria and frequency.  Musculoskeletal: Negative for back pain.  Neurological: Negative for dizziness.       Objective:   Physical Exam  Constitutional: She appears well-developed and well-nourished.  HENT:  Head: Normocephalic and atraumatic.  Eyes: EOM are normal. Pupils are equal, round, and reactive to light.  Neck: Normal range of motion. Neck supple. No thyromegaly present.  Cardiovascular: Normal rate, regular rhythm and normal heart sounds.   Pulmonary/Chest: Breath sounds normal. No respiratory distress. She has no wheezes. She has no rales.  Abdominal: Soft. Bowel sounds are normal. There is no tenderness.  Lymphadenopathy:    She has no cervical adenopathy.  Psychiatric: She has a normal mood and affect. Her behavior is normal. Judgment and thought content normal.          Assessment & Plan:  Recurrent UTI. Urine culture sent. Keflex 500 mg 3 times a day for 7 days pending culture results.

## 2013-08-09 NOTE — Patient Instructions (Signed)
Urinary Tract Infection  Urinary tract infections (UTIs) can develop anywhere along your urinary tract. Your urinary tract is your body's drainage system for removing wastes and extra water. Your urinary tract includes two kidneys, two ureters, a bladder, and a urethra. Your kidneys are a pair of bean-shaped organs. Each kidney is about the size of your fist. They are located below your ribs, one on each side of your spine.  CAUSES  Infections are caused by microbes, which are microscopic organisms, including fungi, viruses, and bacteria. These organisms are so small that they can only be seen through a microscope. Bacteria are the microbes that most commonly cause UTIs.  SYMPTOMS   Symptoms of UTIs may vary by age and gender of the patient and by the location of the infection. Symptoms in young women typically include a frequent and intense urge to urinate and a painful, burning feeling in the bladder or urethra during urination. Older women and men are more likely to be tired, shaky, and weak and have muscle aches and abdominal pain. A fever may mean the infection is in your kidneys. Other symptoms of a kidney infection include pain in your back or sides below the ribs, nausea, and vomiting.  DIAGNOSIS  To diagnose a UTI, your caregiver will ask you about your symptoms. Your caregiver also will ask to provide a urine sample. The urine sample will be tested for bacteria and white blood cells. White blood cells are made by your body to help fight infection.  TREATMENT   Typically, UTIs can be treated with medication. Because most UTIs are caused by a bacterial infection, they usually can be treated with the use of antibiotics. The choice of antibiotic and length of treatment depend on your symptoms and the type of bacteria causing your infection.  HOME CARE INSTRUCTIONS   If you were prescribed antibiotics, take them exactly as your caregiver instructs you. Finish the medication even if you feel better after you  have only taken some of the medication.   Drink enough water and fluids to keep your urine clear or pale yellow.   Avoid caffeine, tea, and carbonated beverages. They tend to irritate your bladder.   Empty your bladder often. Avoid holding urine for long periods of time.   Empty your bladder before and after sexual intercourse.   After a bowel movement, women should cleanse from front to back. Use each tissue only once.  SEEK MEDICAL CARE IF:    You have back pain.   You develop a fever.   Your symptoms do not begin to resolve within 3 days.  SEEK IMMEDIATE MEDICAL CARE IF:    You have severe back pain or lower abdominal pain.   You develop chills.   You have nausea or vomiting.   You have continued burning or discomfort with urination.  MAKE SURE YOU:    Understand these instructions.   Will watch your condition.   Will get help right away if you are not doing well or get worse.  Document Released: 04/27/2005 Document Revised: 01/17/2012 Document Reviewed: 08/26/2011  [employer] Patient Information 2014 [employer]

## 2013-08-12 LAB — URINE CULTURE: Colony Count: >=100000

## 2013-08-20 ENCOUNTER — Ambulatory Visit (INDEPENDENT_AMBULATORY_CARE_PROVIDER_SITE_OTHER): Payer: Medicare HMO | Admitting: Podiatry

## 2013-08-20 ENCOUNTER — Encounter: Payer: Self-pay | Admitting: Podiatry

## 2013-08-20 ENCOUNTER — Ambulatory Visit (INDEPENDENT_AMBULATORY_CARE_PROVIDER_SITE_OTHER): Payer: Medicare HMO

## 2013-08-20 VITALS — BP 136/83 | HR 83 | Resp 18

## 2013-08-20 DIAGNOSIS — M79609 Pain in unspecified limb: Secondary | ICD-10-CM

## 2013-08-20 DIAGNOSIS — M79672 Pain in left foot: Secondary | ICD-10-CM

## 2013-08-20 DIAGNOSIS — S92309A Fracture of unspecified metatarsal bone(s), unspecified foot, initial encounter for closed fracture: Secondary | ICD-10-CM

## 2013-08-20 NOTE — Progress Notes (Signed)
Woke up Saturday morning with my left foot swollen and painful. She denies trauma to her foot. She denies fever chills nausea vomiting muscle aches or pains or falling.  Objective: Vital signs are stable she is alert and oriented x3. Pulses are palpable to the left foot. She has pain on palpation with overlying erythema edema and ecchymosis fifth metatarsal base of the left foot. Radiographic evaluation does demonstrate a fracture of the fifth metatarsal base non-comminuted minimally displaced.  Assessment: Fracture fifth metatarsal base left foot.  Plan: Suggested that she stay off of this as much as possible put her in a compression anklet and a Darco shoe. She's very unstable or I will put her in a Cam Walker.

## 2013-10-01 ENCOUNTER — Ambulatory Visit: Payer: Self-pay

## 2013-10-01 ENCOUNTER — Encounter: Payer: Self-pay | Admitting: Podiatry

## 2013-10-01 ENCOUNTER — Ambulatory Visit (INDEPENDENT_AMBULATORY_CARE_PROVIDER_SITE_OTHER): Payer: Medicare HMO | Admitting: Podiatry

## 2013-10-01 ENCOUNTER — Ambulatory Visit (INDEPENDENT_AMBULATORY_CARE_PROVIDER_SITE_OTHER): Payer: Medicare HMO

## 2013-10-01 VITALS — BP 136/83 | HR 86 | Resp 18

## 2013-10-01 DIAGNOSIS — S92309A Fracture of unspecified metatarsal bone(s), unspecified foot, initial encounter for closed fracture: Secondary | ICD-10-CM

## 2013-10-01 NOTE — Progress Notes (Signed)
Follow up fracture 5th met left foot, pt states no pain. She presents today with her Cam Walker and states of the foot feels 100% better.  Objective: Vital signs are stable she is alert and oriented x3. There is no longer any erythema edema or pain overlying the fifth metatarsal base of the left foot. Radiographic evaluation demonstrates a well healing fracture fifth metatarsal base non-comminuted nondisplaced left foot.  Assessment: Well-healing surgical foot left fifth metatarsal base fracture.  Plan: Followup with me on an as-needed basis.

## 2013-10-31 ENCOUNTER — Encounter: Payer: Self-pay | Admitting: Family Medicine

## 2013-10-31 ENCOUNTER — Ambulatory Visit (INDEPENDENT_AMBULATORY_CARE_PROVIDER_SITE_OTHER): Payer: Managed Care, Other (non HMO) | Admitting: Family Medicine

## 2013-10-31 VITALS — BP 136/82 | HR 76 | Wt 122.0 lb

## 2013-10-31 DIAGNOSIS — E538 Deficiency of other specified B group vitamins: Secondary | ICD-10-CM

## 2013-10-31 DIAGNOSIS — R5381 Other malaise: Secondary | ICD-10-CM

## 2013-10-31 DIAGNOSIS — R5383 Other fatigue: Principal | ICD-10-CM

## 2013-10-31 LAB — BASIC METABOLIC PANEL
BUN: 37 mg/dL — ABNORMAL HIGH (ref 6–23)
CALCIUM: 8.9 mg/dL (ref 8.4–10.5)
CO2: 23 meq/L (ref 19–32)
Chloride: 105 mEq/L (ref 96–112)
Creatinine, Ser: 1.1 mg/dL (ref 0.4–1.2)
GFR: 52.21 mL/min — ABNORMAL LOW (ref >60.00)
Glucose, Bld: 86 mg/dL (ref 70–99)
POTASSIUM: 4.2 meq/L (ref 3.5–5.1)
SODIUM: 135 meq/L (ref 135–145)

## 2013-10-31 LAB — CBC WITH DIFFERENTIAL/PLATELET
Basophils Absolute: 0 10*3/uL (ref 0.0–0.1)
Basophils Relative: 0.6 % (ref 0.0–3.0)
EOS ABS: 0.2 10*3/uL (ref 0.0–0.7)
Eosinophils Relative: 3.6 % (ref 0.0–5.0)
HCT: 27.8 % — ABNORMAL LOW (ref 36.0–46.0)
HEMOGLOBIN: 9.2 g/dL — AB (ref 12.0–15.0)
Lymphocytes Relative: 29.3 % (ref 12.0–46.0)
Lymphs Abs: 1.4 10*3/uL (ref 0.7–4.0)
MCHC: 33.3 g/dL (ref 30.0–36.0)
MCV: 88.2 fl (ref 78.0–100.0)
Monocytes Absolute: 0.3 10*3/uL (ref 0.1–1.0)
Monocytes Relative: 7.1 % (ref 3.0–12.0)
NEUTROS ABS: 2.8 10*3/uL (ref 1.4–7.7)
Neutrophils Relative %: 59.4 % (ref 43.0–77.0)
Platelets: 172 10*3/uL (ref 150.0–400.0)
RBC: 3.15 Mil/uL — AB (ref 3.87–5.11)
RDW: 14 % (ref 11.5–14.6)
WBC: 4.7 10*3/uL (ref 4.5–10.5)

## 2013-10-31 LAB — TSH: TSH: 2.35 u[IU]/mL (ref 0.35–5.50)

## 2013-10-31 NOTE — Progress Notes (Signed)
Subjective:    Patient ID: Gloria [name], female    DOB: 03/20/1930, 80 y.o.   MRN: [id number]  HPI  Patient seen with nonspecific complaints of fatigue. She has a long history of normocytic anemia has had extensive GI workup in the past. She denies any change in sleep patterns. She's felt more fatigued over the past month.  No orthostasis. No chest pains. No dyspnea. She states her appetite is good. Her weight is stable. She remains on pantoprazole and multiple other supplements. She also takes monthly B12 injections (hx of confirmed B12 deficiency).    Past Medical History  Diagnosis Date  . Allergy   . B12 deficiency   . Hypertension   . Atrial fibrillation 2011  . GERD (gastroesophageal reflux disease)   . Osteoporosis   . Recurrent UTI   . Esophageal stricture   . Arthritis   . Diverticulosis   . Normocytic anemia   . Chronic epigastric pain     EM Dr. Jarold MottoPatterson 2006, low LES pressure, high Demeester, unclear if pt on suppression  . Lumbar stenosis   . Chronic anemia     without evidence of iron deficiency in 2005/2008  . Degenerative disc disease    Past Surgical History  Procedure Laterality Date  . Tonsillectomy    . Abdominal hysterectomy  1969    partial, no cancer  . Appendectomy    . Cholecystectomy  1999  . Esophagogastroduodenoscopy  03/19/10    noncritical appearing Schatzki's ring/small hiatal hernia, adenomatous-appearing mucosa-bx benign  . Esophagogastroduodenoscopy  06/04/09    Dr Cherylynn RidgesGessner-dilation  . Colonoscopy  2005    reported Dr Doretha ImusPatterson-diverticulosis  . Esophagogastroduodenoscopy (egd) with esophageal dilation N/A 06/03/2013    Procedure: ESOPHAGOGASTRODUODENOSCOPY (EGD) WITH ESOPHAGEAL DILATION;  Surgeon: Corbin Adeobert M Rourk, MD;  Location: [facility];  Service: Endoscopy;  Laterality: N/A;  2:30    reports that she quit smoking about 3 years ago. Her smoking use included Cigarettes. She has a 6 pack-year smoking history. She does not have any  smokeless tobacco history on file. She reports that she does not drink alcohol or use illicit drugs. family history includes Cancer in her brother; Diabetes in her brother; Hypertension in her father and mother; Leukemia in her father. Allergies  Allergen Reactions  . Caffeine     REACTION: Heart racing  . Dexlansoprazole     GI upset  . Hydromorphone Hcl   . Ibuprofen     REACTION: Decreased hearing  . Meperidine Hcl     REACTION: Severe palpatations  . Morphine     REACTION: Hallucinations  . Penicillins     rash  . Propoxyphene Hcl     REACTION: Hallucinations  . Propoxyphene N-Acetaminophen     REACTION: Hallucinations  . Sulfamethoxazole-Trimethoprim     REACTION: Rash      Review of Systems  Constitutional: Positive for fatigue. Negative for appetite change and unexpected weight change.  Respiratory: Negative for shortness of breath.   Cardiovascular: Negative for chest pain.  Endocrine: Negative for polydipsia and polyuria.  Genitourinary: Negative for dysuria.  Neurological: Negative for dizziness and syncope.  Hematological: Negative for adenopathy. Does not bruise/bleed easily.  Psychiatric/Behavioral: Negative for dysphoric mood.       Objective:   Physical Exam  Constitutional: She is oriented to person, place, and time. She appears well-developed and well-nourished.  Cardiovascular: Normal rate.  Exam reveals no gallop.   Pulmonary/Chest: Effort normal and breath sounds normal. No respiratory distress.  She has no wheezes. She has no rales.  Musculoskeletal: She exhibits no edema.  Neurological: She is alert and oriented to person, place, and time. No cranial nerve deficit.  Skin:  Nail beds are slightly pale          Assessment & Plan:  Fatigue. Patient has long history of normocytic anemia. Recheck CBC. Check basic metabolic panel. Check TSH.

## 2013-10-31 NOTE — Progress Notes (Signed)
Pre visit review using our clinic review tool, if applicable. No additional management support is needed unless otherwise documented below in the visit note. 

## 2013-12-24 ENCOUNTER — Telehealth: Payer: Self-pay | Admitting: Family Medicine

## 2013-12-24 NOTE — Telephone Encounter (Signed)
Patient Information:  Caller Name: Keeli  Phone: [phone]  Patient: Gloria [name], Gloria [name]  Gender: Female  DOB: Sep 19, 1929  Age: 80 Years  PCP: Evelena Peat (Family Practice)  Office Follow Up:  Does the office need to follow up with this patient?: No  Instructions For The Office: N/A  RN Note:  Pt request appt for 5/27  Symptoms  Reason For Call & Symptoms: Pt states that she does not feel well.  Pt reports feeling tired.  No N/V/D.  Pt states that she has developed a red blood looking area on arm but is 3 little spot.  Does not hurt.  No swelling.  Afebrile.  Pt denies dizziness or weakness.  Steady when walking.  Arm area per pt does not look like a bite.  Fatigue is Mild.  Pt reports that she is still able to care for self.  Eating and drinking well.  Reviewed Health History In EMR: Yes  Reviewed Medications In EMR: Yes  Reviewed Allergies In EMR: Yes  Reviewed Surgeries / Procedures: Yes  Date of Onset of Symptoms: 12/22/2013  Guideline(s) Used:  Weakness (Generalized) and Fatigue  Disposition Per Guideline:   See Within 3 Days in Office  Reason For Disposition Reached:   Mild weakness (i.e., does not interfere with ability to work, go to school, normal activities) and persists > 1 week  Advice Given:  Call Back If:  You become worse.  Patient Will Follow Care Advice:  YES  Appointment Scheduled:  12/25/2013 13:30:00 Appointment Scheduled Provider:  Evelena Peat [facility])

## 2013-12-24 NOTE — Telephone Encounter (Signed)
Noted  

## 2013-12-25 ENCOUNTER — Ambulatory Visit (INDEPENDENT_AMBULATORY_CARE_PROVIDER_SITE_OTHER): Payer: Managed Care, Other (non HMO) | Admitting: Family Medicine

## 2013-12-25 ENCOUNTER — Encounter: Payer: Self-pay | Admitting: Family Medicine

## 2013-12-25 VITALS — BP 128/78 | HR 81 | Temp 98.1°F | Wt 119.0 lb

## 2013-12-25 DIAGNOSIS — R5383 Other fatigue: Secondary | ICD-10-CM

## 2013-12-25 DIAGNOSIS — R5381 Other malaise: Secondary | ICD-10-CM

## 2013-12-25 DIAGNOSIS — R233 Spontaneous ecchymoses: Secondary | ICD-10-CM

## 2013-12-25 LAB — CBC WITH DIFFERENTIAL/PLATELET
BASOS PCT: 0.6 % (ref 0.0–3.0)
Basophils Absolute: 0 10*3/uL (ref 0.0–0.1)
Eosinophils Absolute: 0 10*3/uL (ref 0.0–0.7)
Eosinophils Relative: 0 % (ref 0.0–5.0)
HCT: 27 % — ABNORMAL LOW (ref 36.0–46.0)
Lymphocytes Relative: 31.9 % (ref 12.0–46.0)
Lymphs Abs: 1.6 10*3/uL (ref 0.7–4.0)
MCHC: 32.9 g/dL (ref 30.0–36.0)
MCV: 89.7 fl (ref 78.0–100.0)
MONO ABS: 0.4 10*3/uL (ref 0.1–1.0)
Monocytes Relative: 7.5 % (ref 3.0–12.0)
NEUTROS ABS: 3.1 10*3/uL (ref 1.4–7.7)
Neutrophils Relative %: 60 % (ref 43.0–77.0)
Platelets: 174 10*3/uL (ref 150.0–400.0)
RBC: 3.01 Mil/uL — AB (ref 3.87–5.11)
RDW: 14.1 % (ref 11.5–15.5)
WBC: 5.2 10*3/uL (ref 4.0–10.5)

## 2013-12-25 MED ORDER — TRAZODONE HCL 50 MG PO TABS: 50.0000 mg | ORAL_TABLET | Freq: Every day | ORAL | Status: DC

## 2013-12-25 NOTE — Patient Instructions (Signed)

## 2013-12-25 NOTE — Progress Notes (Signed)
Subjective:    Patient ID: Gloria [name], female    DOB: 1930-01-05, 80 y.o.   MRN: [id number]  HPI Patient has chronic problems including history of chronic anemia, chronic atrial fibrillation, B12 deficiency, GERD, esophageal stricture. She is seen today with some nontraumatic ecchymosis involving her arms. No recent trauma. No other bleeding complications. Does not take aspirin or any anticoagulants.  She complains of general fatigue issues. She has chronic insomnia with difficulty falling asleep and also staying asleep. No alcohol use. No caffeine after about 3 PM. She had recent TSH that was normal. Recent chemistries normal. She has chronic normocytic anemia that has been worked up extensively in the past. Denies any recent new stressors  Past Medical History  Diagnosis Date  . Allergy   . B12 deficiency   . Hypertension   . Atrial fibrillation 2011  . GERD (gastroesophageal reflux disease)   . Osteoporosis   . Recurrent UTI   . Esophageal stricture   . Arthritis   . Diverticulosis   . Normocytic anemia   . Chronic epigastric pain     EM Dr. Jarold Motto 2006, low LES pressure, high Demeester, unclear if pt on suppression  . Lumbar stenosis   . Chronic anemia     without evidence of iron deficiency in 2005/2008  . Degenerative disc disease    Past Surgical History  Procedure Laterality Date  . Tonsillectomy    . Abdominal hysterectomy  1969    partial, no cancer  . Appendectomy    . Cholecystectomy  1999  . Esophagogastroduodenoscopy  03/19/10    noncritical appearing Schatzki's ring/small hiatal hernia, adenomatous-appearing mucosa-bx benign  . Esophagogastroduodenoscopy  06/04/09    Dr Cherylynn Ridges  . Colonoscopy  2005    reported Dr Doretha Imus  . Esophagogastroduodenoscopy (egd) with esophageal dilation N/A 06/03/2013    Procedure: ESOPHAGOGASTRODUODENOSCOPY (EGD) WITH ESOPHAGEAL DILATION;  Surgeon: Corbin Ade, MD;  Location: [facility];   Service: Endoscopy;  Laterality: N/A;  2:30    reports that she quit smoking about 3 years ago. Her smoking use included Cigarettes. She has a 6 pack-year smoking history. She does not have any smokeless tobacco history on file. She reports that she does not drink alcohol or use illicit drugs. family history includes Cancer in her brother; Diabetes in her brother; Hypertension in her father and mother; Leukemia in her father. Allergies  Allergen Reactions  . Caffeine     REACTION: Heart racing  . Dexlansoprazole     GI upset  . Hydromorphone Hcl   . Ibuprofen     REACTION: Decreased hearing  . Meperidine Hcl     REACTION: Severe palpatations  . Morphine     REACTION: Hallucinations  . Penicillins     rash  . Propoxyphene Hcl     REACTION: Hallucinations  . Propoxyphene N-Acetaminophen     REACTION: Hallucinations  . Sulfamethoxazole-Trimethoprim     REACTION: Rash      Review of Systems  Constitutional: Positive for fatigue. Negative for fever, chills, appetite change and unexpected weight change.  Eyes: Negative for visual disturbance.  Respiratory: Negative for cough, chest tightness, shortness of breath and wheezing.   Cardiovascular: Negative for chest pain, palpitations and leg swelling.  Endocrine: Negative for polydipsia and polyuria.  Skin: Negative for rash.  Neurological: Negative for dizziness, seizures, syncope, weakness, light-headedness and headaches.  Hematological: Negative for adenopathy. Bruises/bleeds easily.       Objective:   Physical Exam  Constitutional:  She is oriented to person, place, and time. She appears well-developed and well-nourished.  Neck: Neck supple. No thyromegaly present.  Cardiovascular: Normal rate and regular rhythm.  Exam reveals no gallop.   Pulmonary/Chest: Effort normal and breath sounds normal. No respiratory distress. She has no wheezes. She has no rales.  Musculoskeletal:  No pitting edema  Lymphadenopathy:    She has  no cervical adenopathy.  Neurological: She is alert and oriented to person, place, and time.  Psychiatric: She has a normal mood and affect. Her behavior is normal. Thought content normal.          Assessment & Plan:  #1 nontraumatic ecchymosis mostly upper extremities. Suspect benign. Suspect age-related. Check CBC to rule out low platelets #2 fatigue. Suspect related to poor sleep quality. She has long history of insomnia. Sleep hygiene discussed. Trial of trazodone 50 mg each bedtime. Recent labs as above. Reassess in 4 weeks

## 2013-12-25 NOTE — Progress Notes (Signed)
Pre visit review using our clinic review tool, if applicable. No additional management support is needed unless otherwise documented below in the visit note. 

## 2013-12-26 ENCOUNTER — Other Ambulatory Visit: Payer: Self-pay | Admitting: Family Medicine

## 2013-12-26 ENCOUNTER — Telehealth: Payer: Self-pay

## 2013-12-26 NOTE — Telephone Encounter (Signed)
Patient not able to take Trazadone she was hallucinations last night. Pt wants to know if she can get a Rx for  Klonopin 5 mg.

## 2013-12-26 NOTE — Telephone Encounter (Signed)
This would be unusual side effect.  If she is unwilling to give this another few nights trial, d/c Trazodone and start Klonopin 0.5 mg one po qhs prn.  We recommend against regular use if possible.  #30 with one refill and follow up 2 months to reassess

## 2013-12-27 MED ORDER — CLONAZEPAM 0.5 MG PO TABS: 0.5000 mg | ORAL_TABLET | Freq: Every evening | ORAL | Status: AC | PRN

## 2013-12-27 NOTE — Telephone Encounter (Signed)
Pt informed RX faxed to pharmacy.

## 2013-12-31 ENCOUNTER — Other Ambulatory Visit: Payer: Self-pay | Admitting: Family Medicine

## 2013-12-31 DIAGNOSIS — Z139 Encounter for screening, unspecified: Secondary | ICD-10-CM

## 2014-01-08 ENCOUNTER — Ambulatory Visit (INDEPENDENT_AMBULATORY_CARE_PROVIDER_SITE_OTHER): Payer: Managed Care, Other (non HMO) | Admitting: Family Medicine

## 2014-01-08 ENCOUNTER — Encounter: Payer: Self-pay | Admitting: Family Medicine

## 2014-01-08 VITALS — BP 130/74 | HR 82 | Temp 98.6°F | Wt 119.0 lb

## 2014-01-08 DIAGNOSIS — M7520 Bicipital tendinitis, unspecified shoulder: Secondary | ICD-10-CM

## 2014-01-08 DIAGNOSIS — M25512 Pain in left shoulder: Secondary | ICD-10-CM

## 2014-01-08 DIAGNOSIS — M25519 Pain in unspecified shoulder: Secondary | ICD-10-CM

## 2014-01-08 DIAGNOSIS — M7522 Bicipital tendinitis, left shoulder: Secondary | ICD-10-CM

## 2014-01-08 NOTE — Progress Notes (Signed)
Pre visit review using our clinic review tool, if applicable. No additional management support is needed unless otherwise documented below in the visit note. 

## 2014-01-08 NOTE — Progress Notes (Signed)
Subjective:    Patient ID: Gloria [name], female    DOB: 26-Nov-1929, 80 y.o.   MRN: [id number]  Arm Pain  Associated symptoms include numbness. Pertinent negatives include no chest pain.   Very poorly localized left upper extremity pain. Duration about 2 months. No injury. She describes pain in her left shoulder but also left biceps region and occasionally sharp pains involving her hand and wrist. No clear precipitating factors. Sometimes wakes up with pain. Described as sharp quality and moderate to severe at times. She denies any numbness or definite weakness. She's taken Tylenol which helps slightly. She avoids nonsteroidals. Denies any cervical neck pain.  History of chronic normocytic anemia. Recent hemoglobin stable at 8.9. She has occasional lightheadedness with standing but no consistent orthostatic symptoms.  Past Medical History  Diagnosis Date  . Allergy   . B12 deficiency   . Hypertension   . Atrial fibrillation 2011  . GERD (gastroesophageal reflux disease)   . Osteoporosis   . Recurrent UTI   . Esophageal stricture   . Arthritis   . Diverticulosis   . Normocytic anemia   . Chronic epigastric pain     EM Dr. Jarold Motto 2006, low LES pressure, high Demeester, unclear if pt on suppression  . Lumbar stenosis   . Chronic anemia     without evidence of iron deficiency in 2005/2008  . Degenerative disc disease    Past Surgical History  Procedure Laterality Date  . Tonsillectomy    . Abdominal hysterectomy  1969    partial, no cancer  . Appendectomy    . Cholecystectomy  1999  . Esophagogastroduodenoscopy  03/19/10    noncritical appearing Schatzki's ring/small hiatal hernia, adenomatous-appearing mucosa-bx benign  . Esophagogastroduodenoscopy  06/04/09    Dr Cherylynn Ridges  . Colonoscopy  2005    reported Dr Doretha Imus  . Esophagogastroduodenoscopy (egd) with esophageal dilation N/A 06/03/2013    Procedure: ESOPHAGOGASTRODUODENOSCOPY (EGD) WITH  ESOPHAGEAL DILATION;  Surgeon: Corbin Ade, MD;  Location: [facility];  Service: Endoscopy;  Laterality: N/A;  2:30    reports that she quit smoking about 3 years ago. Her smoking use included Cigarettes. She has a 6 pack-year smoking history. She does not have any smokeless tobacco history on file. She reports that she does not drink alcohol or use illicit drugs. family history includes Cancer in her brother; Diabetes in her brother; Hypertension in her father and mother; Leukemia in her father. Allergies  Allergen Reactions  . Caffeine     REACTION: Heart racing  . Dexlansoprazole     GI upset  . Hydromorphone Hcl   . Ibuprofen     REACTION: Decreased hearing  . Meperidine Hcl     REACTION: Severe palpatations  . Morphine     REACTION: Hallucinations  . Penicillins     rash  . Propoxyphene Hcl     REACTION: Hallucinations  . Propoxyphene N-Acetaminophen     REACTION: Hallucinations  . Sulfamethoxazole-Trimethoprim     REACTION: Rash      Review of Systems  Constitutional: Negative for fever, chills, appetite change and unexpected weight change.  Respiratory: Negative for shortness of breath.   Cardiovascular: Negative for chest pain.  Neurological: Positive for numbness. Negative for dizziness and weakness.  Hematological: Negative for adenopathy. Bruises/bleeds easily.       Objective:   Physical Exam  Constitutional: She appears well-developed and well-nourished.  Cardiovascular: Normal rate and regular rhythm.   Pulmonary/Chest: Effort normal and breath sounds  normal. No respiratory distress. She has no wheezes. She has no rales.  Musculoskeletal: She exhibits no edema.  Left upper extremity reveals no edema. She has some left biceps tenderness proximally. She has pain with abduction left shoulder. No specific point tenderness left shoulder. Full range of motion left elbow and left wrist.  Neurological:  2+ symmetric reflexes throughout upper extremities  bilaterally. Normal sensory function. Good grip strength. No focal weakness. She has pain with abducting left shoulder so strength is difficult to assess          Assessment & Plan:  Left upper extremity pains. Difficult to sort out. She has some biceps tenderness and also pain with abducting left shoulder and describing some sharp pains occasionally going to the hand which suggest possible cervical nerve root impingement. Nonfocal exam neurologically. With duration of symptoms we'll set up orthopedic referral

## 2014-01-17 ENCOUNTER — Ambulatory Visit (HOSPITAL_COMMUNITY): Payer: Managed Care, Other (non HMO)

## 2014-01-22 ENCOUNTER — Encounter: Payer: Self-pay | Admitting: Family Medicine

## 2014-01-22 ENCOUNTER — Ambulatory Visit (INDEPENDENT_AMBULATORY_CARE_PROVIDER_SITE_OTHER): Payer: Managed Care, Other (non HMO) | Admitting: Family Medicine

## 2014-01-22 VITALS — BP 120/68 | HR 84 | Temp 97.9°F | Wt 116.0 lb

## 2014-01-22 DIAGNOSIS — K222 Esophageal obstruction: Secondary | ICD-10-CM

## 2014-01-22 DIAGNOSIS — R1310 Dysphagia, unspecified: Secondary | ICD-10-CM

## 2014-01-22 DIAGNOSIS — R634 Abnormal weight loss: Secondary | ICD-10-CM

## 2014-01-22 NOTE — Progress Notes (Signed)
Subjective:    Patient ID: Gloria [name], female    DOB: 04/11/1930, 80 y.o.   MRN: [id number]  HPI Patient seen with some recent mild weight loss and recurrent dysphagia. She has history of esophageal stricture and long history of GERD and actually had EGD last September with dilation for reported Schatzki ring. Her weight loss has been relatively mild. She was 123 pounds back in September of 2014, 119 pounds May of this year and current weight of 116 pounds.  Patient states her appetite is good. She denies any abdominal pain or diarrhea. She has some solid food dysphagia which is somewhat intermittent. She is swallowing pills okay. Denies any depression. Recent TSH back in April normal. Denies any headaches, chest pain, dyspnea, cough,dysuria, or any abdominal pain. Occasional constipation.  Past Medical History  Diagnosis Date  . Allergy   . B12 deficiency   . Hypertension   . Atrial fibrillation 2011  . GERD (gastroesophageal reflux disease)   . Osteoporosis   . Recurrent UTI   . Esophageal stricture   . Arthritis   . Diverticulosis   . Normocytic anemia   . Chronic epigastric pain     EM Dr. Jarold MottoPatterson 2006, low LES pressure, high Demeester, unclear if pt on suppression  . Lumbar stenosis   . Chronic anemia     without evidence of iron deficiency in 2005/2008  . Degenerative disc disease    Past Surgical History  Procedure Laterality Date  . Tonsillectomy    . Abdominal hysterectomy  1969    partial, no cancer  . Appendectomy    . Cholecystectomy  1999  . Esophagogastroduodenoscopy  03/19/10    noncritical appearing Schatzki's ring/small hiatal hernia, adenomatous-appearing mucosa-bx benign  . Esophagogastroduodenoscopy  06/04/09    Dr Cherylynn RidgesGessner-dilation  . Colonoscopy  2005    reported Dr Doretha ImusPatterson-diverticulosis  . Esophagogastroduodenoscopy (egd) with esophageal dilation N/A 06/03/2013    Procedure: ESOPHAGOGASTRODUODENOSCOPY (EGD) WITH ESOPHAGEAL DILATION;  Surgeon:  Corbin Adeobert M Rourk, MD;  Location: [facility];  Service: Endoscopy;  Laterality: N/A;  2:30    reports that she quit smoking about 3 years ago. Her smoking use included Cigarettes. She has a 6 pack-year smoking history. She does not have any smokeless tobacco history on file. She reports that she does not drink alcohol or use illicit drugs. family history includes Cancer in her brother; Diabetes in her brother; Hypertension in her father and mother; Leukemia in her father. Allergies  Allergen Reactions  . Caffeine     REACTION: Heart racing  . Dexlansoprazole     GI upset  . Hydromorphone Hcl   . Ibuprofen     REACTION: Decreased hearing  . Meperidine Hcl     REACTION: Severe palpatations  . Morphine     REACTION: Hallucinations  . Penicillins     rash  . Propoxyphene Hcl     REACTION: Hallucinations  . Propoxyphene N-Acetaminophen     REACTION: Hallucinations  . Sulfamethoxazole-Trimethoprim     REACTION: Rash      Review of Systems  Constitutional: Positive for fatigue and unexpected weight change. Negative for appetite change.  HENT: Positive for trouble swallowing.   Respiratory: Negative for shortness of breath.   Cardiovascular: Negative for chest pain.  Gastrointestinal: Positive for constipation. Negative for nausea, vomiting, abdominal pain and diarrhea.  Endocrine: Negative for polydipsia and polyuria.  Genitourinary: Negative for dysuria.  Hematological: Negative for adenopathy.       Objective:  Physical Exam  Constitutional: She is oriented to person, place, and time. She appears well-developed and well-nourished.  HENT:  Mouth/Throat: Oropharynx is clear and moist.  Neck: Neck supple. No thyromegaly present.  Cardiovascular: Normal rate and regular rhythm.   Pulmonary/Chest: Effort normal and breath sounds normal. No respiratory distress. She has no wheezes. She has no rales.  Abdominal: Soft. Bowel sounds are normal. She exhibits no mass. There is no  tenderness. There is no rebound and no guarding.  Musculoskeletal: She exhibits no edema.  Neurological: She is alert and oriented to person, place, and time.  Psychiatric: She has a normal mood and affect. Her behavior is normal. Thought content normal.          Assessment & Plan:  Recurrent dysphagia in a patient with some mild weight loss. Prior history of GERD, esophageal stricture, and Schatzki ring. Patient saw Dr.Roark (in September) but is requesting referral back to [facility] at this time. She is seeing Dr. Jarold MottoPatterson in the past.  She has chronic normocytic anemia which has been stable. No evidence for recurrent depression at this time

## 2014-01-22 NOTE — Patient Instructions (Signed)
Dysphagia  Swallowing problems (dysphagia) occur when solids and liquids seem to stick in your throat on the way down to your stomach, or the food takes longer to get to the stomach. Other symptoms include regurgitating food, noises coming from the throat, chest discomfort with swallowing, and a feeling of fullness or the feeling of something being stuck in your throat when swallowing. When blockage in your throat is complete it may be associated with drooling.  CAUSES   Problems with swallowing may occur because of problems with the muscles. The food cannot be propelled in the usual manner into your stomach. You may have ulcers, scar tissue, or inflammation in the tube down which food travels from your mouth to your stomach (esophagus), which blocks food from passing normally into the stomach. Causes of inflammation include:  · Acid reflux from your stomach into your esophagus.  · Infection.  · Radiation treatment for cancer.  · Medicines taken without enough fluids to wash them down into your stomach.  You may have nerve problems that prevent signals from being sent to the muscles of your esophagus to contract and move your food down to your stomach. Globus pharyngeus is a relatively common problem in which there is a sense of an obstruction or difficulty in swallowing, without any physical abnormalities of the swallowing passages being found. This problem usually improves over time with reassurance and testing to rule out other causes.  DIAGNOSIS  Dysphagia can be diagnosed and its cause can be determined by tests in which you swallow a white substance that helps illuminate the inside of your throat (contrast medium) while X-rays are taken. Sometimes a flexible telescope that is inserted down your throat (endoscopy) to look at your esophagus and stomach is used.  TREATMENT   · If the dysphagia is caused by acid reflux or infection, medicines may be used.  · If the dysphagia is caused by problems with your  swallowing muscles, swallowing therapy may be used to help you strengthen your swallowing muscles.  · If the dysphagia is caused by a blockage or mass, procedures to remove the blockage may be done.  HOME CARE INSTRUCTIONS  · Try to eat soft food that is easier to swallow and check your weight on a daily basis to be sure that it is not decreasing.  · Be sure to drink liquids when sitting upright (not lying down).  SEEK MEDICAL CARE IF:  · You are losing weight because you are unable to swallow.  · You are coughing when you drink liquids (aspiration).  · You are coughing up partially digested food.  SEEK IMMEDIATE MEDICAL CARE IF:  · You are unable to swallow your own saliva .  · You are having shortness of breath or a fever, or both.  · You have a hoarse voice along with difficulty swallowing.  MAKE SURE YOU:  · Understand these instructions.  · Will watch your condition.  · Will get help right away if you are not doing well or get worse.  Document Released: 07/15/2000 Document Revised: 03/20/2013 Document Reviewed: 01/04/2013  [employer]® Patient Information ©2015 [employer]. This information is not intended to replace advice given to you by your health care provider. Make sure you discuss any questions you have with your health care provider.

## 2014-01-22 NOTE — Progress Notes (Signed)
Pre visit review using our clinic review tool, if applicable. No additional management support is needed unless otherwise documented below in the visit note. 

## 2014-01-24 ENCOUNTER — Encounter: Payer: Self-pay | Admitting: Internal Medicine

## 2014-01-24 ENCOUNTER — Ambulatory Visit
Admission: RE | Admit: 2014-01-24 | Discharge: 2014-01-24 | Disposition: A | Discharge status: Home / Self Care | Payer: Medicare HMO | Source: Ambulatory Visit | Attending: Family Medicine | Admitting: Family Medicine

## 2014-01-24 DIAGNOSIS — Z139 Encounter for screening, unspecified: Secondary | ICD-10-CM

## 2014-03-17 ENCOUNTER — Telehealth: Payer: Self-pay | Admitting: Family Medicine

## 2014-03-17 MED ORDER — PANTOPRAZOLE SODIUM 40 MG PO TBEC: DELAYED_RELEASE_TABLET | ORAL | Status: DC

## 2014-03-17 NOTE — Telephone Encounter (Signed)
Rx sent to pharmacy   

## 2014-03-17 NOTE — Telephone Encounter (Signed)
[id number] - [place], [place] - [address] is requesting 90 day re-fill on pantoprazole (PROTONIX) 40 MG tablet

## 2014-04-04 ENCOUNTER — Ambulatory Visit: Payer: Medicare HMO | Admitting: Internal Medicine

## 2014-04-17 ENCOUNTER — Ambulatory Visit (INDEPENDENT_AMBULATORY_CARE_PROVIDER_SITE_OTHER): Payer: Medicare HMO | Admitting: Podiatry

## 2014-04-17 ENCOUNTER — Ambulatory Visit (INDEPENDENT_AMBULATORY_CARE_PROVIDER_SITE_OTHER): Payer: Medicare HMO

## 2014-04-17 ENCOUNTER — Encounter: Payer: Self-pay | Admitting: Podiatry

## 2014-04-17 VITALS — BP 142/69 | HR 86 | Resp 12

## 2014-04-17 DIAGNOSIS — R52 Pain, unspecified: Secondary | ICD-10-CM

## 2014-04-17 DIAGNOSIS — M722 Plantar fascial fibromatosis: Secondary | ICD-10-CM

## 2014-04-17 NOTE — Progress Notes (Signed)
   Subjective:    Patient ID: Gloria [name], female    DOB: Nov 16, 1929, 80 y.o.   MRN: [id number]  HPI  PT STATED LT BOTTOM OF HEEL IS BEEN PAINFUL AND HAVE BURNING SENSATION FOR 2 WEEKS. THE HEEL IS GETTING WORSE. THE HEEL GET AGGRAVATED BY WALKING. TRIED NO TREATMENT.  Review of Systems  Eyes: Positive for visual disturbance.  Musculoskeletal: Positive for back pain and gait problem.       Objective:   Physical Exam: I have reviewed her past medical history medications allergies surgeries social history. Pulses are palpable bilateral. She's been on palpation medial continued tubercle of her left heel. Radiographic evaluation demonstrates all joints distal to the ankle a full range of motion without crepitation. Severe osteopenia. Soft tissue increase in density at the plantar fascial calcaneal insertion site indicative of plantar fasciitis. Plantar distally oriented calcaneal spurs also noted.        Assessment & Plan:  Assessment: Plantar fasciitis left.  Plan: Injected Kenalog and local anesthetic followup with her as needed.

## 2014-04-21 ENCOUNTER — Telehealth: Payer: Self-pay | Admitting: Family Medicine

## 2014-04-21 MED ORDER — TRAMADOL HCL 50 MG PO TABS: ORAL_TABLET | ORAL | Status: DC

## 2014-04-21 NOTE — Telephone Encounter (Signed)
Refill OK

## 2014-04-21 NOTE — Telephone Encounter (Signed)
Rx called in to pharmacy. 

## 2014-04-21 NOTE — Telephone Encounter (Signed)
[facility] - [place], [place] - [address] is requesting re-fill on traMADol (ULTRAM) 50 MG tablet

## 2014-04-21 NOTE — Telephone Encounter (Signed)
Last visit 01/22/14 

## 2014-04-30 ENCOUNTER — Ambulatory Visit (INDEPENDENT_AMBULATORY_CARE_PROVIDER_SITE_OTHER): Payer: Medicare HMO | Admitting: Family Medicine

## 2014-04-30 VITALS — BP 130/70 | HR 89 | Temp 98.0°F | Wt 110.0 lb

## 2014-04-30 DIAGNOSIS — J0100 Acute maxillary sinusitis, unspecified: Secondary | ICD-10-CM

## 2014-04-30 MED ORDER — CEFUROXIME AXETIL 250 MG PO TABS: 250.0000 mg | ORAL_TABLET | Freq: Two times a day (BID) | ORAL | Status: DC

## 2014-04-30 NOTE — Patient Instructions (Signed)

## 2014-04-30 NOTE — Progress Notes (Signed)
Pre visit review using our clinic review tool, if applicable. No additional management support is needed unless otherwise documented below in the visit note. 

## 2014-04-30 NOTE — Progress Notes (Signed)
Subjective:    Patient ID: Gloria [name], female    DOB: 1929-09-14, 80 y.o.   MRN: [id number]  Sinusitis Associated symptoms include congestion, coughing, headaches and sinus pressure. Pertinent negatives include no chills or sore throat.   Patient seen with one week history of some progressive sinus congestion and right maxillary facial pain. Occasional headaches. Yellow to green nasal discharge. No sore throat. Has taken over-the-counter Tylenol without relief. Minimal cough. She has multiple drug allergies but has taken Keflex in the past without difficulty.  Past Medical History  Diagnosis Date  . Allergy   . B12 deficiency   . Hypertension   . Atrial fibrillation 2011  . GERD (gastroesophageal reflux disease)   . Osteoporosis   . Recurrent UTI   . Esophageal stricture   . Arthritis   . Diverticulosis   . Normocytic anemia   . Chronic epigastric pain     EM Dr. Jarold Motto 2006, low LES pressure, high Demeester, unclear if pt on suppression  . Lumbar stenosis   . Chronic anemia     without evidence of iron deficiency in 2005/2008  . Degenerative disc disease    Past Surgical History  Procedure Laterality Date  . Tonsillectomy    . Abdominal hysterectomy  1969    partial, no cancer  . Appendectomy    . Cholecystectomy  1999  . Esophagogastroduodenoscopy  03/19/10    noncritical appearing Schatzki's ring/small hiatal hernia, adenomatous-appearing mucosa-bx benign  . Esophagogastroduodenoscopy  06/04/09    Dr Cherylynn Ridges  . Colonoscopy  2005    reported Dr Doretha Imus  . Esophagogastroduodenoscopy (egd) with esophageal dilation N/A 06/03/2013    Procedure: ESOPHAGOGASTRODUODENOSCOPY (EGD) WITH ESOPHAGEAL DILATION;  Surgeon: Corbin Ade, MD;  Location: [facility];  Service: Endoscopy;  Laterality: N/A;  2:30    reports that she quit smoking about 3 years ago. Her smoking use included Cigarettes. She has a 6 pack-year smoking history. She does not have  any smokeless tobacco history on file. She reports that she does not drink alcohol or use illicit drugs. family history includes Cancer in her brother; Diabetes in her brother; Hypertension in her father and mother; Leukemia in her father. Allergies  Allergen Reactions  . Caffeine     REACTION: Heart racing  . Dexlansoprazole     GI upset  . Hydromorphone Hcl   . Ibuprofen     REACTION: Decreased hearing  . Meperidine Hcl     REACTION: Severe palpatations  . Morphine     REACTION: Hallucinations  . Penicillins     rash  . Propoxyphene Hcl     REACTION: Hallucinations  . Propoxyphene N-Acetaminophen     REACTION: Hallucinations  . Sulfamethoxazole-Trimethoprim     REACTION: Rash      Review of Systems  Constitutional: Positive for fatigue. Negative for fever and chills.  HENT: Positive for congestion and sinus pressure. Negative for sore throat.   Respiratory: Positive for cough.   Neurological: Positive for headaches.       Objective:   Physical Exam  Constitutional: She appears well-developed and well-nourished. No distress.  HENT:  Right Ear: External ear normal.  Left Ear: External ear normal.  Mouth/Throat: Oropharynx is clear and moist.  Neck: Neck supple.  Cardiovascular: Normal rate.   Pulmonary/Chest: Effort normal and breath sounds normal. No respiratory distress. She has no wheezes. She has no rales.  Lymphadenopathy:    She has no cervical adenopathy.  Assessment & Plan:  Probable acute right maxillary sinusitis. Ceftin 250 mg twice a day for 10 days. Followup when necessary

## 2014-05-13 ENCOUNTER — Encounter: Payer: Self-pay | Admitting: Internal Medicine

## 2014-05-13 ENCOUNTER — Other Ambulatory Visit: Payer: Self-pay | Admitting: Family Medicine

## 2014-05-13 NOTE — Telephone Encounter (Signed)
Last visit 04/30/14 Last refill 04/21/14 #45 0 refill

## 2014-05-14 NOTE — Telephone Encounter (Signed)
I dont think she takes this regularly refill once.

## 2014-05-16 ENCOUNTER — Other Ambulatory Visit: Payer: Self-pay

## 2014-05-19 ENCOUNTER — Encounter: Payer: Self-pay | Admitting: Family Medicine

## 2014-05-19 ENCOUNTER — Ambulatory Visit (INDEPENDENT_AMBULATORY_CARE_PROVIDER_SITE_OTHER): Payer: Medicare HMO | Admitting: Family Medicine

## 2014-05-19 VITALS — BP 130/78 | HR 86 | Temp 97.9°F | Wt 113.0 lb

## 2014-05-19 DIAGNOSIS — D539 Nutritional anemia, unspecified: Secondary | ICD-10-CM

## 2014-05-19 DIAGNOSIS — R5383 Other fatigue: Secondary | ICD-10-CM

## 2014-05-19 DIAGNOSIS — R0789 Other chest pain: Secondary | ICD-10-CM

## 2014-05-19 NOTE — Progress Notes (Signed)
Subjective:    Patient ID: Gloria [name], female    DOB: 03/20/1930, 80 y.o.   MRN: [id number]  HPI Patient has history of chronic anemia which has been worked up extensively per GI the past. She has normocytic anemia and also B12 deficiency but on regular B12 replacement. She's had nonspecific weakness over the past few weeks. Couple weeks ago she lost her balance and fell and fell on her buttocks but has had some right upper chest wall pain since then. She went to urgent care and x-rays reportedly normal. She's not been orthostatic but is concerned that her hemoglobin might be below normal for her. Her baseline is around 8-9 range. She denies any recent cough. No dysuria. No fevers or chills. Mostly takes OTC supplements.  Past Medical History  Diagnosis Date  . Allergy   . B12 deficiency   . Hypertension   . Atrial fibrillation 2011  . GERD (gastroesophageal reflux disease)   . Osteoporosis   . Recurrent UTI   . Esophageal stricture   . Arthritis   . Diverticulosis   . Normocytic anemia   . Chronic epigastric pain     EM Dr. Jarold MottoPatterson 2006, low LES pressure, high Demeester, unclear if pt on suppression  . Lumbar stenosis   . Chronic anemia     without evidence of iron deficiency in 2005/2008  . Degenerative disc disease    Past Surgical History  Procedure Laterality Date  . Tonsillectomy    . Abdominal hysterectomy  1969    partial, no cancer  . Appendectomy    . Cholecystectomy  1999  . Esophagogastroduodenoscopy  03/19/10    noncritical appearing Schatzki's ring/small hiatal hernia, adenomatous-appearing mucosa-bx benign  . Esophagogastroduodenoscopy  06/04/09    Dr Cherylynn RidgesGessner-dilation  . Colonoscopy  2005    reported Dr Doretha ImusPatterson-diverticulosis  . Esophagogastroduodenoscopy (egd) with esophageal dilation N/A 06/03/2013    Procedure: ESOPHAGOGASTRODUODENOSCOPY (EGD) WITH ESOPHAGEAL DILATION;  Surgeon: Corbin Adeobert M Rourk, MD;  Location: [facility];  Service: Endoscopy;   Laterality: N/A;  2:30    reports that she quit smoking about 3 years ago. Her smoking use included Cigarettes. She has a 6 pack-year smoking history. She does not have any smokeless tobacco history on file. She reports that she does not drink alcohol or use illicit drugs. family history includes Cancer in her brother; Diabetes in her brother; Hypertension in her father and mother; Leukemia in her father. Allergies  Allergen Reactions  . Caffeine     REACTION: Heart racing  . Dexlansoprazole     GI upset  . Hydromorphone Hcl   . Ibuprofen     REACTION: Decreased hearing  . Meperidine Hcl     REACTION: Severe palpatations  . Morphine     REACTION: Hallucinations  . Penicillins     rash  . Propoxyphene Hcl     REACTION: Hallucinations  . Propoxyphene N-Acetaminophen     REACTION: Hallucinations  . Sulfamethoxazole-Trimethoprim     REACTION: Rash      Review of Systems  Constitutional: Positive for fatigue. Negative for fever and chills.  Respiratory: Negative for shortness of breath.   Cardiovascular: Negative for chest pain.  Gastrointestinal: Negative for nausea, vomiting, abdominal pain and diarrhea.  Genitourinary: Negative for dysuria.  Neurological: Positive for weakness. Negative for dizziness and headaches.  Hematological: Negative for adenopathy.       Objective:   Physical Exam  Constitutional: She is oriented to person, place, and time. She appears  well-developed and well-nourished. No distress.  HENT:  Mouth/Throat: Oropharynx is clear and moist.  Neck: Neck supple.  Cardiovascular: Normal rate and regular rhythm.   Pulmonary/Chest: Effort normal and breath sounds normal. No respiratory distress. She has no wheezes. She has no rales.  Patient has some nonspecific right chest wall tenderness. No visible bruising  Musculoskeletal: She exhibits no edema.  Lymphadenopathy:    She has no cervical adenopathy.  Neurological: She is alert and oriented to person,  place, and time. No cranial nerve deficit.  Skin: No rash noted.  Nailbeds are somewhat pale.  Psychiatric: She has a normal mood and affect. Her behavior is normal.          Assessment & Plan:  #1 chronic anemia. Recheck CBC. She's not describing any current orthostatic type symptoms.  She has had extensive workup of her anemia previously. #2 right chest wall pain following fall. Recent x-rays reportedly negative. We encouraged deep breathing to avoiding atelectasis.  Suspect chest wall contusion. #3 fatigue- ?related to #1.

## 2014-05-19 NOTE — Progress Notes (Signed)
Pre visit review using our clinic review tool, if applicable. No additional management support is needed unless otherwise documented below in the visit note. 

## 2014-05-20 LAB — CBC WITH DIFFERENTIAL/PLATELET
BASOS ABS: 0 10*3/uL (ref 0.0–0.1)
BASOS PCT: 0.5 % (ref 0.0–3.0)
Eosinophils Absolute: 0.2 10*3/uL (ref 0.0–0.7)
Eosinophils Relative: 4.5 % (ref 0.0–5.0)
Hemoglobin: 8.6 g/dL — ABNORMAL LOW (ref 12.0–15.0)
LYMPHS ABS: 1.6 10*3/uL (ref 0.7–4.0)
Lymphocytes Relative: 28.9 % (ref 12.0–46.0)
MCHC: 32.1 g/dL (ref 30.0–36.0)
MCV: 90.8 fl (ref 78.0–100.0)
Monocytes Absolute: 0.3 10*3/uL (ref 0.1–1.0)
Monocytes Relative: 6.3 % (ref 3.0–12.0)
Neutro Abs: 3.3 10*3/uL (ref 1.4–7.7)
Neutrophils Relative %: 59.8 % (ref 43.0–77.0)
Platelets: 193 10*3/uL (ref 150.0–400.0)
RBC: 2.96 Mil/uL — AB (ref 3.87–5.11)
RDW: 14.5 % (ref 11.5–15.5)
WBC: 5.5 10*3/uL (ref 4.0–10.5)

## 2014-05-22 ENCOUNTER — Telehealth: Payer: Self-pay | Admitting: Internal Medicine

## 2014-05-22 NOTE — Telephone Encounter (Signed)
Forwarding 10 pages to Dr.Gessner,Carl

## 2014-06-02 ENCOUNTER — Encounter (HOSPITAL_COMMUNITY): Payer: Self-pay | Admitting: Emergency Medicine

## 2014-06-02 ENCOUNTER — Emergency Department (HOSPITAL_COMMUNITY)
Admission: EM | Admit: 2014-06-02 | Discharge: 2014-06-02 | Disposition: A | Discharge status: Home / Self Care | Payer: Medicare HMO | Attending: Emergency Medicine | Admitting: Emergency Medicine

## 2014-06-02 ENCOUNTER — Emergency Department (HOSPITAL_COMMUNITY): Payer: Medicare HMO

## 2014-06-02 DIAGNOSIS — D649 Anemia, unspecified: Secondary | ICD-10-CM | POA: Insufficient documentation

## 2014-06-02 DIAGNOSIS — I10 Essential (primary) hypertension: Secondary | ICD-10-CM | POA: Diagnosis not present

## 2014-06-02 DIAGNOSIS — W1830XA Fall on same level, unspecified, initial encounter: Secondary | ICD-10-CM | POA: Diagnosis not present

## 2014-06-02 DIAGNOSIS — S2231XA Fracture of one rib, right side, initial encounter for closed fracture: Secondary | ICD-10-CM

## 2014-06-02 DIAGNOSIS — Z79899 Other long term (current) drug therapy: Secondary | ICD-10-CM | POA: Diagnosis not present

## 2014-06-02 DIAGNOSIS — S2232XA Fracture of one rib, left side, initial encounter for closed fracture: Secondary | ICD-10-CM | POA: Diagnosis not present

## 2014-06-02 DIAGNOSIS — S299XXA Unspecified injury of thorax, initial encounter: Secondary | ICD-10-CM | POA: Diagnosis present

## 2014-06-02 DIAGNOSIS — Y9389 Activity, other specified: Secondary | ICD-10-CM | POA: Diagnosis not present

## 2014-06-02 DIAGNOSIS — Y9289 Other specified places as the place of occurrence of the external cause: Secondary | ICD-10-CM | POA: Insufficient documentation

## 2014-06-02 DIAGNOSIS — G8929 Other chronic pain: Secondary | ICD-10-CM | POA: Diagnosis not present

## 2014-06-02 DIAGNOSIS — Z87891 Personal history of nicotine dependence: Secondary | ICD-10-CM | POA: Diagnosis not present

## 2014-06-02 DIAGNOSIS — Z880 Allergy status to penicillin: Secondary | ICD-10-CM | POA: Insufficient documentation

## 2014-06-02 DIAGNOSIS — E538 Deficiency of other specified B group vitamins: Secondary | ICD-10-CM | POA: Insufficient documentation

## 2014-06-02 DIAGNOSIS — W19XXXA Unspecified fall, initial encounter: Secondary | ICD-10-CM

## 2014-06-02 DIAGNOSIS — Z87440 Personal history of urinary (tract) infections: Secondary | ICD-10-CM | POA: Insufficient documentation

## 2014-06-02 DIAGNOSIS — Z8739 Personal history of other diseases of the musculoskeletal system and connective tissue: Secondary | ICD-10-CM | POA: Diagnosis not present

## 2014-06-02 DIAGNOSIS — K219 Gastro-esophageal reflux disease without esophagitis: Secondary | ICD-10-CM | POA: Insufficient documentation

## 2014-06-02 MED ORDER — TRAMADOL HCL 50 MG PO TABS: 50.0000 mg | ORAL_TABLET | Freq: Four times a day (QID) | ORAL | Status: AC | PRN

## 2014-06-02 NOTE — ED Notes (Signed)
EDP at bedside  

## 2014-06-02 NOTE — ED Provider Notes (Signed)
CSN: [id number]     Arrival date & time 06/02/14  1202 History  This chart was scribed for Gloria HutchingBrian Neftaly Swiss, MD by Tonye RoyaltyJoshua Chen, ED Scribe. This patient was seen in room [facility] and the patient's care was started at 12:20 PM.    Chief Complaint  Patient presents with  . Breast Pain   The history is provided by the patient. No language interpreter was used.    HPI Comments: Gloria MattersJane M [name] is a 80 y.o. female who presents to the Emergency Department complaining of left breast pain radiating to her back with onset 3 weeks ago status post falling onto a cement floor, landing on her bottom. She denies striking her chest, but states she has had breast pain since then. After the fall she was evaluated at an urgent care where her chest was x-rayed, but she states her tailbone was not x-rayed. She denies pain to her tailbone. She states she has been taking Tramadol which has been improving her pain until this morning. She states she lives in an apartment complex and has been able to move around and take care of herself as usual.  Past Medical History  Diagnosis Date  . Allergy   . B12 deficiency   . Hypertension   . Atrial fibrillation 2011  . GERD (gastroesophageal reflux disease)   . Osteoporosis   . Recurrent UTI   . Esophageal stricture   . Arthritis   . Diverticulosis   . Normocytic anemia   . Chronic epigastric pain     EM Dr. Jarold MottoPatterson 2006, low LES pressure, high Demeester, unclear if pt on suppression  . Lumbar stenosis   . Chronic anemia     without evidence of iron deficiency in 2005/2008  . Degenerative disc disease    Past Surgical History  Procedure Laterality Date  . Tonsillectomy    . Abdominal hysterectomy  1969    partial, no cancer  . Appendectomy    . Cholecystectomy  1999  . Esophagogastroduodenoscopy  03/19/10    noncritical appearing Schatzki's ring/small hiatal hernia, adenomatous-appearing mucosa-bx benign  . Esophagogastroduodenoscopy  06/04/09    Dr  Cherylynn RidgesGessner-dilation  . Colonoscopy  2005    reported Dr Doretha ImusPatterson-diverticulosis  . Esophagogastroduodenoscopy (egd) with esophageal dilation N/A 06/03/2013    Procedure: ESOPHAGOGASTRODUODENOSCOPY (EGD) WITH ESOPHAGEAL DILATION;  Surgeon: Corbin Adeobert M Rourk, MD;  Location: [facility];  Service: Endoscopy;  Laterality: N/A;  2:30   Family History  Problem Relation Age of Onset  . Hypertension Mother   . Hypertension Father   . Leukemia Father   . Diabetes Brother   . Cancer Brother     melanoma,bone, metastatic unk origin   History  Substance Use Topics  . Smoking status: Former Smoker -- 0.30 packs/day for 20 years    Types: Cigarettes    Quit date: 09/22/2010  . Smokeless tobacco: Not on file  . Alcohol Use: No   OB History    No data available     Review of Systems A complete 10 system review of systems was obtained and all systems are negative except as noted in the HPI and PMH.   Allergies  Caffeine; Dexlansoprazole; Hydromorphone hcl; Ibuprofen; Meperidine hcl; Morphine; Penicillins; Propoxyphene hcl; Propoxyphene n-acetaminophen; and Sulfamethoxazole-trimethoprim  Home Medications   Prior to Admission medications   Medication Sig Start Date End Date Taking? Authorizing Provider  calcium-vitamin D 250-100 MG-UNIT per tablet Take 1 tablet by mouth 2 (two) times daily.     Yes Historical  Provider, MD  Cholecalciferol (VITAMIN D3) 3000 UNITS TABS Take by mouth daily.     Yes Historical Provider, MD  clonazePAM (KLONOPIN) 0.5 MG tablet Take 1 tablet (0.5 mg total) by mouth at bedtime as needed for anxiety. 12/27/13  Yes Kristian Covey, MD  cyanocobalamin (,VITAMIN B-12,) 1000 MCG/ML injection USE ONE ML ONCE MONTHLY Patient taking differently: Inject 1,000 mcg into the muscle every 30 (thirty) days. USE ONE ML ONCE MONTHLY 06/26/13  Yes Kristian Covey, MD  ferrous sulfate 325 (65 FE) MG tablet Take 325 mg by mouth daily with breakfast.     Yes Historical Provider, MD   pantoprazole (PROTONIX) 40 MG tablet TAKE 1 TABLET (40 MG TOTAL) BY MOUTH DAILY. Patient taking differently: Take 40 mg by mouth daily. TAKE 1 TABLET (40 MG TOTAL) BY MOUTH DAILY. 03/17/14  Yes Kristian Covey, MD  traMADol (ULTRAM) 50 MG tablet TAKE 1 TABLET BY MOUTH EVERY 6 HOURS AS NEEDED 05/14/14  Yes Kristian Covey, MD  vitamin C (ASCORBIC ACID) 500 MG tablet Take 500 mg by mouth daily.     Yes Historical Provider, MD  B-D 3CC LUER-LOK SYR 25GX1" 25G X 1" 3 ML MISC INJECT INTRAMUSCULARLY EVERY MONTH 12/26/13   Kristian Covey, MD  traMADol (ULTRAM) 50 MG tablet Take 1 tablet (50 mg total) by mouth every 6 (six) hours as needed. 06/02/14   Gloria Hutching, MD  traZODone (DESYREL) 50 MG tablet  01/21/14   Historical Provider, MD   BP 164/96 mmHg  Pulse 98  Temp(Src) 98.2 F (36.8 C) (Oral)  Resp 16  Ht 5' (1.524 m)  Wt 113 lb (51.256 kg)  BMI 22.07 kg/m2  SpO2 96% Physical Exam  Constitutional: She is oriented to person, place, and time. She appears well-developed and well-nourished.  HENT:  Head: Normocephalic and atraumatic.  Eyes: Conjunctivae and EOM are normal. Pupils are equal, round, and reactive to light.  Neck: Normal range of motion. Neck supple.  Cardiovascular: Normal rate, regular rhythm and normal heart sounds.   Pulmonary/Chest: Effort normal and breath sounds normal. She exhibits tenderness (tender over her sternum and left lateral ribs).  Abdominal: Soft. Bowel sounds are normal.  Musculoskeletal: Normal range of motion.  Neurological: She is alert and oriented to person, place, and time.  Skin: Skin is warm and dry.  Psychiatric: She has a normal mood and affect. Her behavior is normal.  Nursing note and vitals reviewed.   ED Course  Procedures (including critical care time)  DIAGNOSTIC STUDIES: Oxygen Saturation is 97% on room air, normal by my interpretation.    COORDINATION OF CARE: 12:23 PM Discussed treatment plan with patient at beside, including  x-rays. The patient agrees with the plan and has no further questions at this time.   Labs Review Labs Reviewed - No data to display  Imaging Review Dg Ribs Unilateral W/chest Left  06/02/2014   CLINICAL DATA:  Status post fall 3 weeks ago now with 3-4 days of left breast pain radiating to the back  EXAM: LEFT RIBS AND CHEST - 3+ VIEW  COMPARISON:  PA and lateral chest of May 08, 2014  FINDINGS: There is prominent thoracic kyphosis due to known wedge compressions of at least 1 mid to upper thoracic vertebral body. The aerated portion of the lungs are clear. The heart and mediastinal structures exhibit no acute abnormalities. There is no pleural effusion or pneumothorax. There are deformities of the posterior aspects of multiple upper left ribs  unchanged from the previous study. There is a fracture of the anterior aspect of the left ninth rib. There is an old fracture of the posterior aspect of the right fifth rib.  IMPRESSION: There are multiple fractures of the posterior upper left ribs and right fifth rib which were visible on the previous study. These are of uncertain age. There is also a fracture of the anterior aspect of the left ninth rib which appears acute.   Electronically Signed   By: David  SwazilandJordan   On: 06/02/2014 13:42   Dg Sacrum/coccyx  06/02/2014   CLINICAL DATA:  Fall.  Initial encounter  EXAM: SACRUM AND COCCYX - 2+ VIEW  COMPARISON:  04/12/2012 lumbar spine radiography  FINDINGS: Frontal imaging is limited by the amount of overlapping stool. There is no evidence of fracture or diastasis. Grade 1 L5-S1 anterolisthesis, chronic based on previous imaging.  IMPRESSION: 1. No acute osseous findings. Note that stool and gas limit sensitivity in the frontal projection. 2. L5-S1 facet degeneration with chronic anterolisthesis.   Electronically Signed   By: Tiburcio PeaJonathan  Watts M.D.   On: 06/02/2014 13:41     EKG Interpretation   Date/Time:  Monday June 02 2014 12:15:16 EST Ventricular  Rate:  102 PR Interval:  172 QRS Duration: 72 QT Interval:  338 QTC Calculation: 440 R Axis:   179 Text Interpretation:  Sinus tachycardia Left posterior fascicular block  Borderline T abnormalities, diffuse leads Confirmed by Adriana SimasOOK  MD, Gurjit Loconte  [phone]([id number]) on 06/02/2014 12:41:03 PM      MDM   Final diagnoses:  Fall  Rib fractures, left, closed, initial encounter  Right rib fracture, closed, initial encounter    Patient is in no respiratory distress. She is oxygenating well. Rib films show multiple old rib fractures. Questionable new acute left ninth anterior rib fracture.  Plain films of sacrum and coccyx show no acute injury. Discharge medications tramadol. Patient has follow-up with neurosurgeon on Wednesday.    Gloria HutchingBrian Jahmiya Guidotti, MD 06/02/14 1505

## 2014-06-02 NOTE — ED Notes (Signed)
Per EMS, pt fell x3 weeks ago and was seen at urgent care. Pt given a px for tramadol. Pt reports left breast pain radiating to back x3-4 days. Pt reports pain is worse with movement and inspiration. nad noted. Pt denies any dizziness or sob. Pt denies being on any blood thinners.

## 2014-06-02 NOTE — Discharge Instructions (Signed)
X-ray show mostly old rib fractures. No fracture of your lower spine.  Prescription for tramadol.  Follow-up your regular doctor.

## 2014-06-08 ENCOUNTER — Other Ambulatory Visit: Payer: Self-pay | Admitting: Family Medicine

## 2014-06-10 ENCOUNTER — Telehealth: Payer: Self-pay | Admitting: Family Medicine

## 2014-06-10 NOTE — Telephone Encounter (Signed)
Pt states the tramadol is not working for her pain and she would like to try something else

## 2014-06-11 NOTE — Telephone Encounter (Signed)
Needs to be seen

## 2014-06-11 NOTE — Telephone Encounter (Signed)
Can you please schedule appt for patient

## 2014-06-11 NOTE — Telephone Encounter (Signed)
Pt is scheduled for Friday 11/13

## 2014-06-12 ENCOUNTER — Encounter: Payer: Self-pay | Admitting: Family Medicine

## 2014-06-12 ENCOUNTER — Ambulatory Visit (INDEPENDENT_AMBULATORY_CARE_PROVIDER_SITE_OTHER): Payer: Medicare HMO | Admitting: Family Medicine

## 2014-06-12 VITALS — BP 130/78 | HR 84 | Wt 108.0 lb

## 2014-06-12 DIAGNOSIS — M79604 Pain in right leg: Secondary | ICD-10-CM

## 2014-06-12 DIAGNOSIS — M79601 Pain in right arm: Secondary | ICD-10-CM

## 2014-06-12 DIAGNOSIS — M810 Age-related osteoporosis without current pathological fracture: Secondary | ICD-10-CM

## 2014-06-12 MED ORDER — HYDROCODONE-ACETAMINOPHEN 5-325 MG PO TABS: 1.0000 | ORAL_TABLET | Freq: Four times a day (QID) | ORAL | Status: DC | PRN

## 2014-06-12 NOTE — Progress Notes (Signed)
Pre visit review using our clinic review tool, if applicable. No additional management support is needed unless otherwise documented below in the visit note. 

## 2014-06-12 NOTE — Progress Notes (Signed)
Subjective:    Patient ID: Gloria [name], female    DOB: 03/22/1930, 80 y.o.   MRN: [id number]  HPI Patient is here complaining of back pain. Location is left parascapular region. This worse with movement. She had fall recently went to ER and had x-rays which showed some remote left posterior rib fractures. No pleuritic pain. Denies any injury since that fall. She has been taking tramadol without relief in pain is still 7-8 out of 10 at times. Interfering some with sleep. She has multiple drug intolerances. She does not recall taking hydrocodone previously. She thinks she may have tolerated oxycodone once for another injury. She denies any spinal pain. Her pain is again more left posterior rib cage.  She has history of severe osteoporosis and kyphosis. She has refused any prescription treatments for osteoporosis in the past after long discussion of risk and benefits.  Past Medical History  Diagnosis Date  . Allergy   . B12 deficiency   . Hypertension   . Atrial fibrillation 2011  . GERD (gastroesophageal reflux disease)   . Osteoporosis   . Recurrent UTI   . Esophageal stricture   . Arthritis   . Diverticulosis   . Normocytic anemia   . Chronic epigastric pain     EM Dr. Jarold MottoPatterson 2006, low LES pressure, high Demeester, unclear if pt on suppression  . Lumbar stenosis   . Chronic anemia     without evidence of iron deficiency in 2005/2008  . Degenerative disc disease    Past Surgical History  Procedure Laterality Date  . Tonsillectomy    . Abdominal hysterectomy  1969    partial, no cancer  . Appendectomy    . Cholecystectomy  1999  . Esophagogastroduodenoscopy  03/19/10    noncritical appearing Schatzki's ring/small hiatal hernia, adenomatous-appearing mucosa-bx benign  . Esophagogastroduodenoscopy  06/04/09    Dr Cherylynn RidgesGessner-dilation  . Colonoscopy  2005    reported Dr Doretha ImusPatterson-diverticulosis  . Esophagogastroduodenoscopy (egd) with esophageal dilation N/A 06/03/2013   Procedure: ESOPHAGOGASTRODUODENOSCOPY (EGD) WITH ESOPHAGEAL DILATION;  Surgeon: Corbin Adeobert M Rourk, MD;  Location: [facility];  Service: Endoscopy;  Laterality: N/A;  2:30    reports that she quit smoking about 3 years ago. Her smoking use included Cigarettes. She has a 6 pack-year smoking history. She does not have any smokeless tobacco history on file. She reports that she does not drink alcohol or use illicit drugs. family history includes Cancer in her brother; Diabetes in her brother; Hypertension in her father and mother; Leukemia in her father. Allergies  Allergen Reactions  . Caffeine     REACTION: Heart racing  . Dexlansoprazole     GI upset  . Hydromorphone Hcl   . Ibuprofen     REACTION: Decreased hearing  . Meperidine Hcl     REACTION: Severe palpatations  . Morphine     REACTION: Hallucinations  . Penicillins     rash  . Propoxyphene Hcl     REACTION: Hallucinations  . Propoxyphene N-Acetaminophen     REACTION: Hallucinations  . Sulfamethoxazole-Trimethoprim     REACTION: Rash      Review of Systems  Constitutional: Negative for fever and chills.  Respiratory: Negative for cough.   Cardiovascular: Negative for chest pain.  Musculoskeletal: Positive for back pain.  Psychiatric/Behavioral: Negative for confusion.       Objective:   Physical Exam  Constitutional: She appears well-developed and well-nourished.  Cardiovascular: Normal rate and regular rhythm.   Pulmonary/Chest: Effort normal  and breath sounds normal. She has no wheezes. She has no rales.  Musculoskeletal:  No spinal tenderness thoracic region. She has fairly severe kyphosis. She has some poorly localized tenderness left posterior ribs          Assessment & Plan:  #1 left posterior rib pain. She may have had occult fracture though no acute fracture recent x-rays. Her pain is not controlled with tramadol. We had a long discussion regarding potential adverse issues with opioids but her pain is  not well controlled. We discussed potential side effects such as constipation, confusion, sedation. Agreed to trial of Vicodin 5 mg 1-2 every 6 hours for severe pain. She is encouraged to start stool softener 2 daily and increase fluids #2 severe kyphosis and osteoporosis. She has refused bisphosphonates or any injectables in the past. She again refuses today. She does take calcium and vitamin D.

## 2014-06-12 NOTE — Patient Instructions (Signed)
When taking the medication make sure to: Keep stools soft.  Should start a stool softener, since you have had recent constipation Stay very well hydrated and drink a lot of water Do not drive; the medication can make you very groggy

## 2014-06-13 ENCOUNTER — Emergency Department (HOSPITAL_COMMUNITY)
Admission: EM | Admit: 2014-06-13 | Discharge: 2014-06-13 | Disposition: A | Discharge status: Home / Self Care | Payer: Medicare HMO | Attending: Emergency Medicine | Admitting: Emergency Medicine

## 2014-06-13 ENCOUNTER — Ambulatory Visit: Payer: Medicare HMO | Admitting: Family Medicine

## 2014-06-13 ENCOUNTER — Encounter (HOSPITAL_COMMUNITY): Payer: Self-pay | Admitting: Emergency Medicine

## 2014-06-13 ENCOUNTER — Emergency Department (HOSPITAL_COMMUNITY): Payer: Medicare HMO

## 2014-06-13 DIAGNOSIS — D649 Anemia, unspecified: Secondary | ICD-10-CM | POA: Diagnosis not present

## 2014-06-13 DIAGNOSIS — Z9889 Other specified postprocedural states: Secondary | ICD-10-CM | POA: Insufficient documentation

## 2014-06-13 DIAGNOSIS — R1310 Dysphagia, unspecified: Secondary | ICD-10-CM | POA: Insufficient documentation

## 2014-06-13 DIAGNOSIS — Z87440 Personal history of urinary (tract) infections: Secondary | ICD-10-CM | POA: Diagnosis not present

## 2014-06-13 DIAGNOSIS — K219 Gastro-esophageal reflux disease without esophagitis: Secondary | ICD-10-CM | POA: Insufficient documentation

## 2014-06-13 DIAGNOSIS — G8929 Other chronic pain: Secondary | ICD-10-CM | POA: Diagnosis not present

## 2014-06-13 DIAGNOSIS — I10 Essential (primary) hypertension: Secondary | ICD-10-CM | POA: Diagnosis not present

## 2014-06-13 DIAGNOSIS — M1990 Unspecified osteoarthritis, unspecified site: Secondary | ICD-10-CM | POA: Insufficient documentation

## 2014-06-13 DIAGNOSIS — Z880 Allergy status to penicillin: Secondary | ICD-10-CM | POA: Diagnosis not present

## 2014-06-13 DIAGNOSIS — Z87891 Personal history of nicotine dependence: Secondary | ICD-10-CM | POA: Diagnosis not present

## 2014-06-13 DIAGNOSIS — Z79899 Other long term (current) drug therapy: Secondary | ICD-10-CM | POA: Diagnosis not present

## 2014-06-13 DIAGNOSIS — E538 Deficiency of other specified B group vitamins: Secondary | ICD-10-CM | POA: Insufficient documentation

## 2014-06-13 DIAGNOSIS — M810 Age-related osteoporosis without current pathological fracture: Secondary | ICD-10-CM | POA: Diagnosis not present

## 2014-06-13 DIAGNOSIS — R0781 Pleurodynia: Secondary | ICD-10-CM

## 2014-06-13 DIAGNOSIS — R0602 Shortness of breath: Secondary | ICD-10-CM | POA: Insufficient documentation

## 2014-06-13 LAB — CBC WITH DIFFERENTIAL/PLATELET
Basophils Absolute: 0 K/uL (ref 0.0–0.1)
Basophils Relative: 0 % (ref 0–1)
Eosinophils Absolute: 0 K/uL (ref 0.0–0.7)
Eosinophils Relative: 0 % (ref 0–5)
HCT: 31 % — ABNORMAL LOW (ref 36.0–46.0)
Hemoglobin: 10 g/dL — ABNORMAL LOW (ref 12.0–15.0)
Lymphocytes Relative: 31 % (ref 12–46)
Lymphs Abs: 1.5 K/uL (ref 0.7–4.0)
MCH: 29.6 pg (ref 26.0–34.0)
MCHC: 32.3 g/dL (ref 30.0–36.0)
MCV: 91.7 fL (ref 78.0–100.0)
Monocytes Absolute: 0.3 K/uL (ref 0.1–1.0)
Monocytes Relative: 6 % (ref 3–12)
Neutro Abs: 3 K/uL (ref 1.7–7.7)
Neutrophils Relative %: 63 % (ref 43–77)
Platelets: 210 K/uL (ref 150–400)
RBC: 3.38 MIL/uL — ABNORMAL LOW (ref 3.87–5.11)
RDW: 15 % (ref 11.5–15.5)
WBC: 4.8 K/uL (ref 4.0–10.5)

## 2014-06-13 LAB — BASIC METABOLIC PANEL
Anion gap: 15 (ref 5–15)
BUN: 54 mg/dL — AB (ref 6–23)
CHLORIDE: 106 meq/L (ref 96–112)
CO2: 21 meq/L (ref 19–32)
Calcium: 9.8 mg/dL (ref 8.4–10.5)
Creatinine, Ser: 1.27 mg/dL — ABNORMAL HIGH (ref 0.50–1.10)
GFR calc Af Amer: 44 mL/min — ABNORMAL LOW (ref >90)
GFR calc non Af Amer: 38 mL/min — ABNORMAL LOW (ref >90)
Glucose, Bld: 94 mg/dL (ref 70–99)
Potassium: 4.4 mEq/L (ref 3.7–5.3)
Sodium: 142 mEq/L (ref 137–147)

## 2014-06-13 LAB — TROPONIN I: Troponin I: <0.3 ng/mL (ref <0.30)

## 2014-06-13 MED ORDER — FENTANYL CITRATE 0.05 MG/ML IJ SOLN: 25.0000 ug | Freq: Once | INTRAMUSCULAR | Status: DC

## 2014-06-13 MED ORDER — GLUCAGON HCL RDNA (DIAGNOSTIC) 1 MG IJ SOLR
1.0000 mg | Freq: Once | INTRAMUSCULAR | Status: AC
  Administered 2014-06-13: 1 mg via INTRAVENOUS
  Filled 2014-06-13: qty 1

## 2014-06-13 MED ORDER — TRAMADOL HCL 50 MG PO TABS
100.0000 mg | ORAL_TABLET | Freq: Once | ORAL | Status: AC
  Administered 2014-06-13: 100 mg via ORAL
  Filled 2014-06-13: qty 2

## 2014-06-13 NOTE — ED Notes (Signed)
Discharge instructions given, pt demonstrated teach back and verbal understanding. No concerns voiced.  

## 2014-06-13 NOTE — ED Notes (Signed)
Patient states "I've already drank 3 cups of water and so far nothing has come up."

## 2014-06-13 NOTE — ED Notes (Signed)
Patient states "I don't take strong medicine. I don't want narcotic medicines. I normally take tramadol and I last took it about 10 am this morning." Patient requesting tramadol for pain. Advised Dr Patria Maneampos. Verbal order for 100 mg tramadol PO, once.

## 2014-06-13 NOTE — ED Notes (Signed)
Gave patient water as ordered per MD.

## 2014-06-13 NOTE — ED Notes (Signed)
Patient ambulated to bathroom with one assist. No acute distress noted at this time.

## 2014-06-13 NOTE — ED Notes (Addendum)
Pt reports dysphagia and left sided rib pain that started this am. Pt reports rib pain is worse with a deep breath. Pt reports "when i try and eat something it wont go down and comes right back up." airway patent. Pt denies any cp. Pt denies any recent or known injury.pt reports history of same and had to have an EGD completed and was diagnosed with gastritis.

## 2014-06-13 NOTE — Discharge Instructions (Signed)
Clear Liquid Diet A clear liquid diet is a short-term diet that is prescribed to provide the necessary fluid and basic energy you need when you can have nothing else. The clear liquid diet consists of liquids or solids that will become liquid at room temperature. You should be able to see through the liquid. There are many reasons that you may be restricted to clear liquids, such as:  When you have a sudden-onset (acute) condition that occurs before or after surgery.  To help your body slowly get adjusted to food again after a long period when you were unable to have food.  Replacement of fluids when you have a diarrheal disease.  When you are going to have certain exams, such as a colonoscopy, in which instruments are inserted inside your body to look at parts of your digestive system. WHAT CAN I HAVE? A clear liquid diet does not provide all the nutrients you need. It is important to choose a variety of the following items to get as many nutrients as possible:  Vegetable juices that do not have pulp.  Fruit juices and fruit drinks that do not have pulp.  Coffee (regular or decaffeinated), tea, or soda at the discretion of your health care provider.  Clear bouillon, broth, or strained broth-based soups.  High-protein and flavored gelatins.  Sugar or honey.  Ices or frozen ice pops that do not contain milk. If you are not sure whether you can have certain items, you should ask your health care provider. You may also ask your health care provider if there are any other clear liquid options. Document Released: 07/18/2005 Document Revised: 07/23/2013 Document Reviewed: 06/14/2013 [employer] Patient Information 2015 [employer]. This information is not intended to replace advice given to you by your health care provider. Make sure you discuss any questions you have with your health care provider.  

## 2014-06-13 NOTE — ED Provider Notes (Signed)
CSN: [id number]     Arrival date & time 06/13/14  1319 History  This chart was scribed for Lyanne CoKevin M Andrea Ferrer, MD by Gwenyth Oberatherine Macek, ED Scribe. This patient was seen in room APA12/APA12 and the patient's care was started at 4:57 PM.    Chief Complaint  Patient presents with  . Dysphagia   The history is provided by the patient and a relative.    HPI Comments: Gloria [name] is a 80 y.o. female who presents to the Emergency Department complaining of constant left-sided rib pain that started 4 days ago and dysphagia that started this morning. She also notes SOB associated with pain. Pt has history of dysphagia and had last esophagogastroduodenoscopy in 2011.patient has had esophageal strictures before in the past and required dilation.  She has not tried drinking since this morning. Her son-in-law notes that she has not been using a spittoon for saliva today. Pt's daughter states that she fell 1 month ago and notes ED suspected bruised ribs. Pt denies cough and rash as associated symptoms.  Past Medical History  Diagnosis Date  . Allergy   . B12 deficiency   . Hypertension   . Atrial fibrillation 2011  . GERD (gastroesophageal reflux disease)   . Osteoporosis   . Recurrent UTI   . Esophageal stricture   . Arthritis   . Diverticulosis   . Normocytic anemia   . Chronic epigastric pain     EM Dr. Jarold MottoPatterson 2006, low LES pressure, high Demeester, unclear if pt on suppression  . Lumbar stenosis   . Chronic anemia     without evidence of iron deficiency in 2005/2008  . Degenerative disc disease    Past Surgical History  Procedure Laterality Date  . Tonsillectomy    . Abdominal hysterectomy  1969    partial, no cancer  . Appendectomy    . Cholecystectomy  1999  . Esophagogastroduodenoscopy  03/19/10    noncritical appearing Schatzki's ring/small hiatal hernia, adenomatous-appearing mucosa-bx benign  . Esophagogastroduodenoscopy  06/04/09    Dr Cherylynn RidgesGessner-dilation  . Colonoscopy  2005     reported Dr Doretha ImusPatterson-diverticulosis  . Esophagogastroduodenoscopy (egd) with esophageal dilation N/A 06/03/2013    Procedure: ESOPHAGOGASTRODUODENOSCOPY (EGD) WITH ESOPHAGEAL DILATION;  Surgeon: Corbin Adeobert M Rourk, MD;  Location: [facility];  Service: Endoscopy;  Laterality: N/A;  2:30   Family History  Problem Relation Age of Onset  . Hypertension Mother   . Hypertension Father   . Leukemia Father   . Diabetes Brother   . Cancer Brother     melanoma,bone, metastatic unk origin   History  Substance Use Topics  . Smoking status: Former Smoker -- 0.30 packs/day for 20 years    Types: Cigarettes    Quit date: 09/22/2010  . Smokeless tobacco: Not on file  . Alcohol Use: No   OB History    No data available     Review of Systems  A complete 10 system review of systems was obtained and all systems are negative except as noted in the HPI and PMH.   Allergies  Caffeine; Dexlansoprazole; Hydromorphone hcl; Ibuprofen; Meperidine hcl; Morphine; Penicillins; Propoxyphene hcl; Propoxyphene n-acetaminophen; and Sulfamethoxazole-trimethoprim  Home Medications   Prior to Admission medications   Medication Sig Start Date End Date Taking? Authorizing Provider  cyanocobalamin (,VITAMIN B-12,) 1000 MCG/ML injection Inject 1,000 mcg into the muscle every 30 (thirty) days.   Yes Historical Provider, MD  pantoprazole (PROTONIX) 40 MG tablet Take 40 mg by mouth daily.  Yes Historical Provider, MD  B-D 3CC LUER-LOK SYR 25GX1" 25G X 1" 3 ML MISC INJECT 1ML INTRAMUSCULARLY EVERY MONTH Patient not taking: Reported on 06/13/2014 12/26/13   Kristian CoveyBruce W Burchette, MD  calcium-vitamin D 250-100 MG-UNIT per tablet Take 1 tablet by mouth 2 (two) times daily.      Historical Provider, MD  Cholecalciferol (VITAMIN D3) 3000 UNITS TABS Take by mouth daily.      Historical Provider, MD  clonazePAM (KLONOPIN) 0.5 MG tablet Take 1 tablet (0.5 mg total) by mouth at bedtime as needed for anxiety. 12/27/13   Kristian CoveyBruce W  Burchette, MD  cyanocobalamin (,VITAMIN B-12,) 1000 MCG/ML injection USE ONE ML ONCE MONTHLY Patient not taking: Reported on 06/13/2014 06/26/13   Kristian CoveyBruce W Burchette, MD  ferrous sulfate 325 (65 FE) MG tablet Take 325 mg by mouth daily with breakfast.      Historical Provider, MD  HYDROcodone-acetaminophen (NORCO/VICODIN) 5-325 MG per tablet Take 1-2 tablets by mouth every 6 (six) hours as needed for moderate pain. 06/12/14   Kristian CoveyBruce W Burchette, MD  pantoprazole (PROTONIX) 40 MG tablet TAKE 1 TABLET EVERY DAY Patient not taking: Reported on 06/13/2014 06/09/14   Kristian CoveyBruce W Burchette, MD  traMADol (ULTRAM) 50 MG tablet Take 1 tablet (50 mg total) by mouth every 6 (six) hours as needed. 06/02/14   Donnetta HutchingBrian Cook, MD  traZODone (DESYREL) 50 MG tablet  01/21/14   Historical Provider, MD  vitamin C (ASCORBIC ACID) 500 MG tablet Take 500 mg by mouth daily.      Historical Provider, MD   BP 136/87 mmHg  Pulse 99  Temp(Src) 97.5 F (36.4 C) (Oral)  Resp 18  Ht 5' (1.524 m)  Wt 108 lb (48.988 kg)  BMI 21.09 kg/m2  SpO2 96% Physical Exam  Constitutional: She is oriented to person, place, and time. She appears well-developed and well-nourished. No distress.  HENT:  Head: Normocephalic and atraumatic.  Eyes: EOM are normal.  Neck: Normal range of motion.  Cardiovascular: Normal rate, regular rhythm and normal heart sounds.   Pulmonary/Chest: Effort normal and breath sounds normal. No respiratory distress. She has no wheezes.  Pronounced kyphosis  Abdominal: Soft. She exhibits no distension. There is no tenderness.  Musculoskeletal: Normal range of motion. She exhibits no tenderness.  No abnormalities of breast No changes in skin Mild tenderness of left lateral chest   Neurological: She is alert and oriented to person, place, and time.  Skin: Skin is warm and dry. No rash noted. No erythema.  Psychiatric: She has a normal mood and affect. Judgment normal.  Nursing note and vitals reviewed.   ED  Course  Procedures (including critical care time)  DIAGNOSTIC STUDIES: Oxygen Saturation is 96% on RA, adequate by my interpretation.    COORDINATION OF CARE: 5:09 PM Discussed treatment plan with pt which includes chest x-ray and lab work and pt agreed to plan.  6:52 PM Pt states pain relieved with medication. Pt tolerated intake of 3 glasses of water. [facility] Advised pt to keep liquid diet until follow up with GI doctor. Pt agreed to plan.    Labs Review Labs Reviewed  CBC WITH DIFFERENTIAL  BASIC METABOLIC PANEL  TROPONIN I    Imaging Review Dg Chest 2 View  06/13/2014   CLINICAL DATA:  Left anterior rib pain, vomiting. Reported history of esophageal stricture.  EXAM: CHEST  2 VIEW  COMPARISON:  06/02/2014  FINDINGS: The heart size and mediastinal contours are within normal  limits. Both lungs are clear. The visualized skeletal structures are unremarkable. Lungs are hypoaerated with crowding of the bronchovascular markings. The aorta is unfolded and ectatic. Right humeral sclerotic focus most likely represents a bone island. The patient is extremely kyphotic. Upper thoracic compression deformities are reidentified but not well assessed without dedicated views today. Moderate hiatal hernia.  IMPRESSION: Low lung volume exam without focal acute finding. Moderate hiatal hernia. Esophageal stricture or achalasia could appear similar.   Electronically Signed   By: Christiana Pellant M.D.   On: 06/13/2014 14:30     EKG Interpretation None      MDM   Final diagnoses:  Rib pain on left side    Patient has kept down for glasses of water while in the emergency department.  I suspect she has esophageal stricture or a symptomatic hiatal hernia however she is keeping fluids down.  I do not think she needs urgent endoscopy tonight.  I doubt she has a food impaction at this time as she is keeping fluids and saliva down.  Patient be placed on clear liquids through the  weekend.  Asked that she call the gastroenterology office for follow-up on Monday.  She understands to return to the ER for new or worsening symptoms.  I personally performed the services described in this documentation, which was scribed in my presence. The recorded information has been reviewed and is accurate.       Lyanne Co, MD 06/13/14 [phone]

## 2014-06-16 ENCOUNTER — Telehealth: Payer: Self-pay | Admitting: Internal Medicine

## 2014-06-16 NOTE — Telephone Encounter (Signed)
Needs urgent OV for further evaluation.

## 2014-06-16 NOTE — Telephone Encounter (Signed)
Darl PikesSusan please get her in   Thanks Thalya Fouche

## 2014-06-16 NOTE — Telephone Encounter (Signed)
Please advise 

## 2014-06-16 NOTE — Telephone Encounter (Signed)
Pt is aware of OV on Wednesday at 330 with AS

## 2014-06-16 NOTE — Telephone Encounter (Signed)
Patient was seen in the ED recently for dysphagia and was told to call us this morning to schedule an EGD ASAP.  Pt said that she had xrays done, but she did not have a procedure done while in the ED. We have an URG spot with AS on Wednesday at 330 if we need to see patient in office or can we go ahead and schedule procedure?  Please advise and let patient know what she needs to do. [phone]

## 2014-06-17 ENCOUNTER — Emergency Department (HOSPITAL_COMMUNITY): Payer: Medicare HMO

## 2014-06-17 ENCOUNTER — Inpatient Hospital Stay (HOSPITAL_COMMUNITY)
Admission: EM | Admit: 2014-06-17 | Discharge: 2014-06-21 | DRG: 392 | Disposition: A | Discharge status: Home / Self Care | Payer: Medicare HMO | Attending: Internal Medicine | Admitting: Internal Medicine

## 2014-06-17 ENCOUNTER — Encounter (HOSPITAL_COMMUNITY): Payer: Self-pay | Admitting: Emergency Medicine

## 2014-06-17 DIAGNOSIS — R0600 Dyspnea, unspecified: Secondary | ICD-10-CM | POA: Diagnosis present

## 2014-06-17 DIAGNOSIS — Z8249 Family history of ischemic heart disease and other diseases of the circulatory system: Secondary | ICD-10-CM

## 2014-06-17 DIAGNOSIS — M810 Age-related osteoporosis without current pathological fracture: Secondary | ICD-10-CM | POA: Diagnosis present

## 2014-06-17 DIAGNOSIS — E860 Dehydration: Secondary | ICD-10-CM | POA: Diagnosis present

## 2014-06-17 DIAGNOSIS — Z833 Family history of diabetes mellitus: Secondary | ICD-10-CM

## 2014-06-17 DIAGNOSIS — K219 Gastro-esophageal reflux disease without esophagitis: Secondary | ICD-10-CM | POA: Diagnosis present

## 2014-06-17 DIAGNOSIS — G8929 Other chronic pain: Secondary | ICD-10-CM | POA: Diagnosis present

## 2014-06-17 DIAGNOSIS — R740 Nonspecific elevation of levels of transaminase and lactic acid dehydrogenase [LDH]: Secondary | ICD-10-CM

## 2014-06-17 DIAGNOSIS — N179 Acute kidney failure, unspecified: Secondary | ICD-10-CM

## 2014-06-17 DIAGNOSIS — E872 Acidosis, unspecified: Secondary | ICD-10-CM

## 2014-06-17 DIAGNOSIS — R7401 Elevation of levels of liver transaminase levels: Secondary | ICD-10-CM | POA: Insufficient documentation

## 2014-06-17 DIAGNOSIS — E162 Hypoglycemia, unspecified: Secondary | ICD-10-CM | POA: Diagnosis present

## 2014-06-17 DIAGNOSIS — K440 Diaphragmatic hernia with obstruction, without gangrene: Principal | ICD-10-CM | POA: Diagnosis present

## 2014-06-17 DIAGNOSIS — I10 Essential (primary) hypertension: Secondary | ICD-10-CM | POA: Diagnosis present

## 2014-06-17 DIAGNOSIS — Z90710 Acquired absence of both cervix and uterus: Secondary | ICD-10-CM

## 2014-06-17 DIAGNOSIS — R1314 Dysphagia, pharyngoesophageal phase: Secondary | ICD-10-CM | POA: Insufficient documentation

## 2014-06-17 DIAGNOSIS — Z87891 Personal history of nicotine dependence: Secondary | ICD-10-CM

## 2014-06-17 DIAGNOSIS — N289 Disorder of kidney and ureter, unspecified: Secondary | ICD-10-CM

## 2014-06-17 DIAGNOSIS — I4891 Unspecified atrial fibrillation: Secondary | ICD-10-CM | POA: Diagnosis present

## 2014-06-17 DIAGNOSIS — Z806 Family history of leukemia: Secondary | ICD-10-CM

## 2014-06-17 DIAGNOSIS — D638 Anemia in other chronic diseases classified elsewhere: Secondary | ICD-10-CM | POA: Insufficient documentation

## 2014-06-17 DIAGNOSIS — Z808 Family history of malignant neoplasm of other organs or systems: Secondary | ICD-10-CM

## 2014-06-17 DIAGNOSIS — K222 Esophageal obstruction: Secondary | ICD-10-CM | POA: Insufficient documentation

## 2014-06-17 LAB — CBC WITH DIFFERENTIAL/PLATELET
BASOS PCT: 1 % (ref 0–1)
Basophils Absolute: 0 10*3/uL (ref 0.0–0.1)
Eosinophils Absolute: 0.1 10*3/uL (ref 0.0–0.7)
Eosinophils Relative: 2 % (ref 0–5)
HCT: 32.2 % — ABNORMAL LOW (ref 36.0–46.0)
HEMOGLOBIN: 10.5 g/dL — AB (ref 12.0–15.0)
Lymphocytes Relative: 31 % (ref 12–46)
Lymphs Abs: 1.6 10*3/uL (ref 0.7–4.0)
MCH: 29.7 pg (ref 26.0–34.0)
MCHC: 32.6 g/dL (ref 30.0–36.0)
MCV: 91.2 fL (ref 78.0–100.0)
Monocytes Absolute: 0.3 10*3/uL (ref 0.1–1.0)
Monocytes Relative: 7 % (ref 3–12)
NEUTROS PCT: 59 % (ref 43–77)
Neutro Abs: 3 10*3/uL (ref 1.7–7.7)
Platelets: 245 10*3/uL (ref 150–400)
RBC: 3.53 MIL/uL — ABNORMAL LOW (ref 3.87–5.11)
RDW: 14.7 % (ref 11.5–15.5)
WBC: 5 10*3/uL (ref 4.0–10.5)

## 2014-06-17 LAB — COMPREHENSIVE METABOLIC PANEL
ALK PHOS: 93 U/L (ref 39–117)
ALT: 58 U/L — ABNORMAL HIGH (ref 0–35)
AST: 66 U/L — ABNORMAL HIGH (ref 0–37)
Albumin: 3.8 g/dL (ref 3.5–5.2)
Anion gap: 26 — ABNORMAL HIGH (ref 5–15)
BUN: 53 mg/dL — ABNORMAL HIGH (ref 6–23)
CO2: 14 meq/L — AB (ref 19–32)
Calcium: 9.4 mg/dL (ref 8.4–10.5)
Chloride: 95 mEq/L — ABNORMAL LOW (ref 96–112)
Creatinine, Ser: 1.71 mg/dL — ABNORMAL HIGH (ref 0.50–1.10)
GFR, EST AFRICAN AMERICAN: 31 mL/min — AB (ref >90)
GFR, EST NON AFRICAN AMERICAN: 27 mL/min — AB (ref >90)
Glucose, Bld: 51 mg/dL — ABNORMAL LOW (ref 70–99)
POTASSIUM: 5.1 meq/L (ref 3.7–5.3)
SODIUM: 135 meq/L — AB (ref 137–147)
TOTAL PROTEIN: 7.9 g/dL (ref 6.0–8.3)
Total Bilirubin: 0.6 mg/dL (ref 0.3–1.2)

## 2014-06-17 LAB — PRO B NATRIURETIC PEPTIDE: Pro B Natriuretic peptide (BNP): 3241 pg/mL — ABNORMAL HIGH (ref 0–450)

## 2014-06-17 LAB — TROPONIN I
Troponin I: <0.3 ng/mL (ref <0.30)
Troponin I: <0.3 ng/mL (ref <0.30)

## 2014-06-17 LAB — CBG MONITORING, ED: GLUCOSE-CAPILLARY: 237 mg/dL — AB (ref 70–99)

## 2014-06-17 LAB — LACTIC ACID, PLASMA: LACTIC ACID, VENOUS: 1.2 mmol/L (ref 0.5–2.2)

## 2014-06-17 MED ORDER — DEXTROSE-NACL 5-0.45 % IV SOLN
INTRAVENOUS | Status: DC
  Administered 2014-06-17: 21:00:00 via INTRAVENOUS

## 2014-06-17 MED ORDER — TRAMADOL HCL 50 MG PO TABS
50.0000 mg | ORAL_TABLET | Freq: Four times a day (QID) | ORAL | Status: DC | PRN
  Administered 2014-06-18 – 2014-06-20 (×8): 50 mg via ORAL
  Filled 2014-06-17 (×8): qty 1

## 2014-06-17 MED ORDER — HEPARIN SODIUM (PORCINE) 5000 UNIT/ML IJ SOLN
5000.0000 [IU] | Freq: Three times a day (TID) | INTRAMUSCULAR | Status: DC
  Administered 2014-06-18 (×4): 5000 [IU] via SUBCUTANEOUS
  Filled 2014-06-17 (×5): qty 1

## 2014-06-17 MED ORDER — DEXTROSE 50 % IV SOLN
1.0000 | Freq: Once | INTRAVENOUS | Status: AC
  Administered 2014-06-17 (×2): 50 mL via INTRAVENOUS

## 2014-06-17 MED ORDER — DEXTROSE 5 % IV SOLN
1.0000 g | INTRAVENOUS | Status: DC
  Administered 2014-06-18: 1 g via INTRAVENOUS
  Filled 2014-06-17: qty 10

## 2014-06-17 MED ORDER — PANTOPRAZOLE SODIUM 40 MG PO TBEC
40.0000 mg | DELAYED_RELEASE_TABLET | Freq: Every day | ORAL | Status: DC
  Administered 2014-06-18 – 2014-06-21 (×3): 40 mg via ORAL
  Filled 2014-06-17 (×3): qty 1

## 2014-06-17 MED ORDER — DEXTROSE 50 % IV SOLN
INTRAVENOUS | Status: AC
  Administered 2014-06-17: 50 mL via INTRAVENOUS
  Filled 2014-06-17: qty 50

## 2014-06-17 MED ORDER — DEXTROSE 5 % IV SOLN
500.0000 mg | INTRAVENOUS | Status: DC
  Administered 2014-06-18: 500 mg via INTRAVENOUS
  Filled 2014-06-17: qty 500

## 2014-06-17 MED ORDER — SODIUM CHLORIDE 0.9 % IV SOLN
INTRAVENOUS | Status: DC
  Administered 2014-06-17 – 2014-06-18 (×2): via INTRAVENOUS

## 2014-06-17 NOTE — H&P (Addendum)
Hospitalist Admission History and Physical  Patient name: Gloria [name] Medical record number: [id number] Date of birth: 08-20-1929 Age: 80 y.o. Gender: female  Primary Care Provider: Kristian Covey, MD  Chief Complaint: dyspnea, AKI, hypoglycemia, chest pain  History of Present Illness:This is a 80 y.o. year old female with significant past medical history of esophageal stricture s/p multiple dilatations-most recent 2011, anemia, GERD presenting with dyspnea, AKI, hypoglyemia, chest pain. Pt noted to have had episode of ? Food stuck in her esophagus 3-4 days ago. Since this point, pt has had recurrent cough and SOB. Daughter reports pt w/ prior hx/o aspiration in the past. Denies any fevers or chills. Pt non smoker. Pt has only been drinking clears since incident. Denies any episodes of emesis. Pt reports persistent anterior chest pain. ? Worse with movement and deep breathing.  On presentation to the ER, T 97.6, HR 90s-100s, resp 10-20, BP 120s-170s, satting 96% on RA. WBC 5, hgb 10.5, Cr 1.7, BUN 53, bicarb 14, Glu 51. Pt given amp of d50 and started on D5 drip. CXR negative for any acute abnormality. + emphysema and stable cardiomegaly. Pro BNP 3200, trop neg x1. EKG NSR.   Assessment and Plan: Gloria [name] is a 80 y.o. year old female presenting with dyspnea, AKI, chest pain   Active Problems:   Dyspnea   1- Dyspnea  -CXR w/o infiltrate currently  -satting well on RA -noted LLL rales on exam  -given increased aspiration risk, will cover for CAP w/ rocephin and azithro.  -blood cultures -repeat CXR as pt is hydrated -check 2D ECHO given cardiomegaly and elevated BNP  -clinically fairly dry on exam   2- AKI  -clinically dry on exam  -suspect prerenal etiology -gently hydrate  -follow  3- metabolic acidosis -likely secondary to AKI and dehydration  -check lactate  -follow   4- Chest Pain  -marked anterior chest wall TTP on exam  -EKG and trop neg x1 -cycle CEs  -tele  bed  -? Overlapping GI etiology in setting of hx/o esophageal stricture -cont ppi  5- esophageal stricture -recurrent issue  -pt/family feel this is worsening-in need of dilatation  -consult GI (seen Rourk in the past) -MBS, dys 3 diet in the interim  6- hypoglycemia  -resolved s/p D50 and D5  -likely seondary to decreased po intake  -q2hr CBGs -follow   FEN/GI: dys 3 diet  Prophylaxis: sub q heparin  Disposition: pending further evauation  Code Status:Full Code    Patient Active Problem List   Diagnosis Date Noted  . Dyspnea 06/17/2014  . Lumbar stenosis 01/25/2011  . ATRIAL FIBRILLATION 06/11/2010  . BACTERIAL PNEUMONIA 06/11/2010  . CONSTIPATION 05/12/2010  . RAYNAUDS SYNDROME 04/14/2010  . EPIGASTRIC PAIN, CHRONIC 04/07/2010  . B12 DEFICIENCY 04/01/2010  . EDEMA LEG 03/10/2010  . URINARY TRACT INFECTION, RECURRENT 06/24/2009  . REFLUX ESOPHAGITIS 05/25/2009  . DYSPHAGIA 05/25/2009  . HYPERTENSION 10/21/2008  . Deficiency anemia 10/05/2007  . HEMATURIA 04/30/2007  . Osteoporosis 04/30/2007  . ESOPHAGEAL STRICTURE 04/18/2007  . HIATAL HERNIA 04/18/2007  . ALLERGIC RHINITIS 02/05/2007  . GERD 02/05/2007  . OVERACTIVE BLADDER 02/05/2007  . DIVERTICULOSIS, COLON 05/28/2004   Past Medical History: Past Medical History  Diagnosis Date  . Allergy   . B12 deficiency   . Hypertension   . Atrial fibrillation 2011  . GERD (gastroesophageal reflux disease)   . Osteoporosis   . Recurrent UTI   . Esophageal stricture   . Arthritis   . Diverticulosis   .  Normocytic anemia   . Chronic epigastric pain     EM Dr. Jarold Motto 2006, low LES pressure, high Demeester, unclear if pt on suppression  . Lumbar stenosis   . Chronic anemia     without evidence of iron deficiency in 2005/2008  . Degenerative disc disease     Past Surgical History: Past Surgical History  Procedure Laterality Date  . Tonsillectomy    . Abdominal hysterectomy  1969    partial, no cancer  .  Appendectomy    . Cholecystectomy  1999  . Esophagogastroduodenoscopy  03/19/10    noncritical appearing Schatzki's ring/small hiatal hernia, adenomatous-appearing mucosa-bx benign  . Esophagogastroduodenoscopy  06/04/09    Dr Cherylynn Ridges  . Colonoscopy  2005    reported Dr Doretha Imus  . Esophagogastroduodenoscopy (egd) with esophageal dilation N/A 06/03/2013    Procedure: ESOPHAGOGASTRODUODENOSCOPY (EGD) WITH ESOPHAGEAL DILATION;  Surgeon: Corbin Ade, MD;  Location: [facility];  Service: Endoscopy;  Laterality: N/A;  2:30    Social History: History   Social History  . Marital Status: Widowed    Spouse Name: N/A    Number of Children: 1  . Years of Education: N/A   Occupational History  . retired; Ship broker    Social History Main Topics  . Smoking status: Former Smoker -- 0.30 packs/day for 20 years    Types: Cigarettes    Quit date: 09/22/2010  . Smokeless tobacco: None  . Alcohol Use: No  . Drug Use: No  . Sexual Activity: None   Other Topics Concern  . None   Social History Narrative   1 daughter lives nearby   Pt lives alone    Family History: Family History  Problem Relation Age of Onset  . Hypertension Mother   . Hypertension Father   . Leukemia Father   . Diabetes Brother   . Cancer Brother     melanoma,bone, metastatic unk origin    Allergies: Allergies  Allergen Reactions  . Propoxyphene N-Acetaminophen Other (See Comments)    REACTION: Hallucinations  . Caffeine     REACTION: Heart racing  . Dexlansoprazole     GI upset  . Hydromorphone Hcl Other (See Comments)    hallucination  . Ibuprofen Other (See Comments)    REACTION: Decreased hearing  . Morphine Other (See Comments)    REACTION: Hallucinations  . Propoxyphene Hcl Other (See Comments)    REACTION: Hallucinations  . Meperidine Hcl Palpitations    REACTION: Severe palpatations  . Penicillins Rash  . Sulfamethoxazole-Trimethoprim Rash    REACTION: Rash     Current Facility-Administered Medications  Medication Dose Route Frequency Provider Last Rate Last Dose  . 0.9 %  sodium chloride infusion   Intravenous Continuous Doree Albee, MD      . azithromycin (ZITHROMAX) 500 mg in dextrose 5 % 250 mL IVPB  500 mg Intravenous Q24H Doree Albee, MD      . cefTRIAXone (ROCEPHIN) 1 g in dextrose 5 % 50 mL IVPB  1 g Intravenous Q24H Doree Albee, MD      . dextrose 5 %-0.45 % sodium chloride infusion   Intravenous Continuous Ward Givens, MD 75 mL/hr at 06/17/14 2120    . heparin injection 5,000 Units  5,000 Units Subcutaneous 3 times per day Doree Albee, MD       Current Outpatient Prescriptions  Medication Sig Dispense Refill  . calcium-vitamin D 250-100 MG-UNIT per tablet Take 1 tablet by mouth 2 (two) times daily.      Marland Kitchen  Cholecalciferol (VITAMIN D) 2000 UNITS CAPS Take 1 capsule by mouth daily.    . clonazePAM (KLONOPIN) 0.5 MG tablet Take 1 tablet (0.5 mg total) by mouth at bedtime as needed for anxiety. (Patient taking differently: Take 0.125 mg by mouth at bedtime as needed for anxiety. ) 30 tablet 1  . cyanocobalamin (,VITAMIN B-12,) 1000 MCG/ML injection Inject 1,000 mcg into the muscle every 30 (thirty) days.    . ferrous sulfate 325 (65 FE) MG tablet Take 325 mg by mouth daily with breakfast.      . pantoprazole (PROTONIX) 40 MG tablet Take 40 mg by mouth daily.    . traMADol (ULTRAM) 50 MG tablet Take 1 tablet (50 mg total) by mouth every 6 (six) hours as needed. 30 tablet 0  . vitamin C (ASCORBIC ACID) 500 MG tablet Take 500 mg by mouth daily.      . B-D 3CC LUER-LOK SYR 25GX1" 25G X 1" 3 ML MISC INJECT INTRAMUSCULARLY EVERY MONTH (Patient not taking: Reported on 06/13/2014) 50 each 3   Review Of Systems: 12 point ROS negative except as noted above in HPI.  Physical Exam: Filed Vitals:   06/17/14 2100  BP: 145/87  Pulse: 102  Temp:   Resp: 18    General: cooperative, underweight  HEENT: PERRLA and extra ocular movement  intact Heart: S1, S2 normal, no murmur, rub or gallop, regular rate and rhythm Lungs: good air movement, faint rales in LLL  Abdomen: abdomen is soft without significant tenderness, masses, organomegaly or guarding Extremities: extremities normal, atraumatic, no cyanosis or edema Skin:no rashes, no ecchymoses Neurology: normal without focal findings  Labs and Imaging: Lab Results  Component Value Date/Time   NA 135* 06/17/2014 07:43 PM   K 5.1 06/17/2014 07:43 PM   CL 95* 06/17/2014 07:43 PM   CO2 14* 06/17/2014 07:43 PM   BUN 53* 06/17/2014 07:43 PM   CREATININE 1.71* 06/17/2014 07:43 PM   CREATININE 1.28* 06/14/2011 12:00 AM   GLUCOSE 51* 06/17/2014 07:43 PM   Lab Results  Component Value Date   WBC 5.0 06/17/2014   HGB 10.5* 06/17/2014   HCT 32.2* 06/17/2014   MCV 91.2 06/17/2014   PLT 245 06/17/2014    Dg Chest 2 View  06/17/2014   CLINICAL DATA:  Initial evaluation for shortness of Breath. History of hypertension, atrial fibrillation. Former smoker.  EXAM: CHEST  2 VIEW  COMPARISON:  Prior radiograph from 06/13/2014.  FINDINGS: Cardiomegaly is stable from prior study. Mediastinal silhouette within normal limits. Tortuosity the intrathoracic aorta noted. Right rib bowing of the trachea at the aortic knob is stable. Hiatal hernia again noted.  Lungs are mildly hypoinflated. Attenuation of pulmonary markings suggest underlying COPD. No pulmonary edema or pleural effusion. No focal infiltrates to suggest acute infectious pneumonitis. He C opacity overlying the peripheral left upper and mid lung favored to reflect summation of shadows as the patient is slightly rotated to the left on frontal projection.  Remotely healed left-sided rib fractures noted. Diffuse osteopenia present. Accentuation of the normal thoracic kyphosis with multilevel degenerative changes seen throughout the visualized spine.  IMPRESSION: 1. No active cardiopulmonary disease. 2. Emphysema. 3. Stable cardiomegaly  without pulmonary edema.   Electronically Signed   By: Rise Mu M.D.   On: 06/17/2014 20:34           Doree Albee MD  Pager: [phone]

## 2014-06-17 NOTE — ED Notes (Addendum)
PT states she has worsening with SOB since ED visit on 06/13/14 on exertion. PT denies any chest pain. PT also states she needs her esophagus stretched and is only able to drink liquids and broth for nutrition.

## 2014-06-17 NOTE — ED Notes (Signed)
Pt reports SOB starting this morning. States she was told she needs her esophagus stretched ASAP on Friday.

## 2014-06-17 NOTE — ED Notes (Signed)
Daughter: Karmen BongoScarlet McGee [phone]

## 2014-06-17 NOTE — ED Notes (Signed)
Dextrose 5%-0.45% sodium chloride infusion discontinued by verbal order from Dr. Alvester MorinNewton.

## 2014-06-17 NOTE — ED Notes (Signed)
MD at bedside. 

## 2014-06-17 NOTE — ED Provider Notes (Signed)
CSN: [id number]     Arrival date & time 06/17/14  1513 History   First MD Initiated Contact with Patient 06/17/14 1901     Chief Complaint  Patient presents with  . Shortness of Breath     (Consider location/radiation/quality/duration/timing/severity/associated sxs/prior Treatment) HPI   Patient states she has a history of esophageal strictures and has had several dilatations done, the last was in 2011. She was seen in the ED on November 13 when she felt like she had scrambled egg stuck in her esophagus. However while she was in the ER she was able to drink fluids and felt like they went down. She relates when she was at home she had tried to eat something with the eggs were stuck in that she did have it come back up. She does not describe any choking episodes. However she started having shortness of breath last night without cough or fever. She does describe chills. She states she has "esophagus pain" when she tries to swallow. She has only been drinking broth since she was seen. She has an appointment to see her gastroenterologist tomorrow afternoon. She describes her chest pain as being in the center of her chest and it is constant. It's sharp and aching. She does not have nausea or vomiting with it. She states sitting up makes the pain feel worse, laying down makes the pain feel better. She states she's never had this pain before.  PCP Dr Caryl NeverBurchette GI Dr Jena Gaussourk  Past Medical History  Diagnosis Date  . Allergy   . B12 deficiency   . Hypertension   . Atrial fibrillation 2011  . GERD (gastroesophageal reflux disease)   . Osteoporosis   . Recurrent UTI   . Esophageal stricture   . Arthritis   . Diverticulosis   . Normocytic anemia   . Chronic epigastric pain     EM Dr. Jarold MottoPatterson 2006, low LES pressure, high [place]ter, unclear if pt on suppression  . Lumbar stenosis   . Chronic anemia     without evidence of iron deficiency in 2005/2008  . Degenerative disc disease    Past  Surgical History  Procedure Laterality Date  . Tonsillectomy    . Abdominal hysterectomy  1969    partial, no cancer  . Appendectomy    . Cholecystectomy  1999  . Esophagogastroduodenoscopy  03/19/10    noncritical appearing Schatzki's ring/small hiatal hernia, adenomatous-appearing mucosa-bx benign  . Esophagogastroduodenoscopy  06/04/09    Dr Cherylynn RidgesGessner-dilation  . Colonoscopy  2005    reported Dr Doretha ImusPatterson-diverticulosis  . Esophagogastroduodenoscopy (egd) with esophageal dilation N/A 06/03/2013    Procedure: ESOPHAGOGASTRODUODENOSCOPY (EGD) WITH ESOPHAGEAL DILATION;  Surgeon: Corbin Adeobert M Rourk, MD;  Location: [facility];  Service: Endoscopy;  Laterality: N/A;  2:30   Family History  Problem Relation Age of Onset  . Hypertension Mother   . Hypertension Father   . Leukemia Father   . Diabetes Brother   . Cancer Brother     melanoma,bone, metastatic unk origin   History  Substance Use Topics  . Smoking status: Former Smoker -- 0.30 packs/day for 20 years    Types: Cigarettes    Quit date: 09/22/2010  . Smokeless tobacco: Not on file  . Alcohol Use: No  she quit smoking 35 years ago Widowed, husbands did not smoke  OB History    No data available     Review of Systems  All other systems reviewed and are negative.     Allergies  Propoxyphene  n-acetaminophen; Caffeine; Dexlansoprazole; Hydromorphone hcl; Ibuprofen; Morphine; Propoxyphene hcl; Meperidine hcl; Penicillins; and Sulfamethoxazole-trimethoprim  Home Medications   Prior to Admission medications   Medication Sig Start Date End Date Taking? Authorizing Provider  B-D 3CC LUER-LOK SYR 25GX1" 25G X 1" 3 ML MISC INJECT INTRAMUSCULARLY EVERY MONTH Patient not taking: Reported on 06/13/2014 12/26/13   Kristian Covey, MD  calcium-vitamin D 250-100 MG-UNIT per tablet Take 1 tablet by mouth 2 (two) times daily.      Historical Provider, MD  Cholecalciferol (VITAMIN D) 2000 UNITS CAPS Take 1 capsule by mouth daily.     Historical Provider, MD  Cholecalciferol (VITAMIN D3) 3000 UNITS TABS Take by mouth daily.      Historical Provider, MD  clonazePAM (KLONOPIN) 0.5 MG tablet Take 1 tablet (0.5 mg total) by mouth at bedtime as needed for anxiety. Patient taking differently: Take 0.125 mg by mouth at bedtime as needed for anxiety.  12/27/13   Kristian Covey, MD  cyanocobalamin (,VITAMIN B-12,) 1000 MCG/ML injection USE ONE ML ONCE MONTHLY Patient not taking: Reported on 06/13/2014 06/26/13   Kristian Covey, MD  cyanocobalamin (,VITAMIN B-12,) 1000 MCG/ML injection Inject 1,000 mcg into the muscle every 30 (thirty) days.    Historical Provider, MD  ferrous sulfate 325 (65 FE) MG tablet Take 325 mg by mouth daily with breakfast.      Historical Provider, MD  HYDROcodone-acetaminophen (NORCO/VICODIN) 5-325 MG per tablet Take 1-2 tablets by mouth every 6 (six) hours as needed for moderate pain. 06/12/14   Kristian Covey, MD  pantoprazole (PROTONIX) 40 MG tablet TAKE 1 TABLET EVERY DAY Patient not taking: Reported on 06/13/2014 06/09/14   Kristian Covey, MD  pantoprazole (PROTONIX) 40 MG tablet Take 40 mg by mouth daily.    Historical Provider, MD  traMADol (ULTRAM) 50 MG tablet Take 1 tablet (50 mg total) by mouth every 6 (six) hours as needed. 06/02/14   Donnetta Hutching, MD  traZODone (DESYREL) 50 MG tablet  01/21/14   Historical Provider, MD  vitamin C (ASCORBIC ACID) 500 MG tablet Take 500 mg by mouth daily.      Historical Provider, MD   BP 176/81 mmHg  Pulse 92  Temp(Src) 97.6 F (36.4 C) (Oral)  Resp 24  Ht 5' (1.524 m)  Wt 108 lb (48.988 kg)  BMI 21.09 kg/m2  SpO2 97%  Vital signs normal except hypertension  Physical Exam  Constitutional: She is oriented to person, place, and time.  Non-toxic appearance. She does not appear ill. No distress.  Frail elderly female  HENT:  Head: Normocephalic and atraumatic.  Right Ear: External ear normal.  Left Ear: External ear normal.  Nose: Nose normal. No  mucosal edema or rhinorrhea.  Mouth/Throat: Oropharynx is clear and moist and mucous membranes are normal. No dental abscesses or uvula swelling.  Eyes: Conjunctivae and EOM are normal. Pupils are equal, round, and reactive to light.  Neck: Normal range of motion and full passive range of motion without pain. Neck supple.  Cardiovascular: Normal rate, regular rhythm and normal heart sounds.  Exam reveals no gallop and no friction rub.   No murmur heard. Pulmonary/Chest: Effort normal and breath sounds normal. No respiratory distress. She has no wheezes. She has no rhonchi. She has no rales. She exhibits tenderness. She exhibits no crepitus.    Patient has diffuse tenderness of her anterior chest wall without deformity  Abdominal: Soft. Normal appearance and bowel sounds are normal. She exhibits no distension.  There is no tenderness. There is no rebound and no guarding.  Musculoskeletal: Normal range of motion. She exhibits no edema or tenderness.  Patient has kyphosis  Neurological: She is alert and oriented to person, place, and time. She has normal strength. No cranial nerve deficit.  Skin: Skin is warm, dry and intact. No rash noted. No erythema. No pallor.  Psychiatric: She has a normal mood and affect. Her speech is normal and behavior is normal. Her mood appears not anxious.  Nursing note and vitals reviewed.   ED Course  Procedures (including critical care time)  Medications  dextrose 5 %-0.45 % sodium chloride infusion ( Intravenous New Bag/Given 06/17/14 2120)  dextrose 50 % solution 50 mL (50 mLs Intravenous Given 06/17/14 2121)   Patient given 1 amp of D50 and started on D5 half-normal saline infusion for her hypoglycemia. She is only been drinking broth and eating some vegetables because of her esophageal stricture so this may account for her hypoglycemia. Patient also noted to have a metabolic acidosis most likely from her dehydration and worsening renal function. Chest x-ray  does not show evidence of aspiration pneumonia from her choking episode 2 days ago. She also has remained with normal oxygen levels and without fever.  I have talked to patient and her family and they are agreeable for overnight admission because of her hypoglycemia and inability to eat or drink normally.  21:22 Dr Alvester MorinNewton will see patient to decide about admission.     Labs Review Results for orders placed or performed during the hospital encounter of 06/17/14  Troponin I  Result Value Ref Range   Troponin I <0.30 <0.30 ng/mL  Comprehensive metabolic panel  Result Value Ref Range   Sodium 135 (L) 137 - 147 mEq/L   Potassium 5.1 3.7 - 5.3 mEq/L   Chloride 95 (L) 96 - 112 mEq/L   CO2 14 (L) 19 - 32 mEq/L   Glucose, Bld 51 (L) 70 - 99 mg/dL   BUN 53 (H) 6 - 23 mg/dL   Creatinine, Ser 1.191.71 (H) 0.50 - 1.10 mg/dL   Calcium 9.4 8.4 - 14.710.5 mg/dL   Total Protein 7.9 6.0 - 8.3 g/dL   Albumin 3.8 3.5 - 5.2 g/dL   AST 66 (H) 0 - 37 U/L   ALT 58 (H) 0 - 35 U/L   Alkaline Phosphatase 93 39 - 117 U/L   Total Bilirubin 0.6 0.3 - 1.2 mg/dL   GFR calc non Af Amer 27 (L) >90 mL/min   GFR calc Af Amer 31 (L) >90 mL/min   Anion gap 26 (H) 5 - 15  CBC with Differential  Result Value Ref Range   WBC 5.0 4.0 - 10.5 K/uL   RBC 3.53 (L) 3.87 - 5.11 MIL/uL   Hemoglobin 10.5 (L) 12.0 - 15.0 g/dL   HCT 82.932.2 (L) 56.236.0 - 13.046.0 %   MCV 91.2 78.0 - 100.0 fL   MCH 29.7 26.0 - 34.0 pg   MCHC 32.6 30.0 - 36.0 g/dL   RDW 86.514.7 78.411.5 - 69.615.5 %   Platelets 245 150 - 400 K/uL   Neutrophils Relative % 59 43 - 77 %   Neutro Abs 3.0 1.7 - 7.7 K/uL   Lymphocytes Relative 31 12 - 46 %   Lymphs Abs 1.6 0.7 - 4.0 K/uL   Monocytes Relative 7 3 - 12 %   Monocytes Absolute 0.3 0.1 - 1.0 K/uL   Eosinophils Relative 2 0 - 5 %   Eosinophils  Absolute 0.1 0.0 - 0.7 K/uL   Basophils Relative 1 0 - 1 %   Basophils Absolute 0.0 0.0 - 0.1 K/uL  Pro b natriuretic peptide (BNP)  Result Value Ref Range   Pro B Natriuretic  peptide (BNP) 3241.0 (H) 0 - 450 pg/mL   Laboratory interpretation all normal except hypoglycemia, renal insufficiency with mild increase in her creatinine from baseline   Imaging Review Dg Chest 2 View  06/17/2014   CLINICAL DATA:  Initial evaluation for shortness of Breath. History of hypertension, atrial fibrillation. Former smoker.  EXAM: CHEST  2 VIEW  COMPARISON:  Prior radiograph from 06/13/2014.  FINDINGS: Cardiomegaly is stable from prior study. Mediastinal silhouette within normal limits. Tortuosity the intrathoracic aorta noted. Right rib bowing of the trachea at the aortic knob is stable. Hiatal hernia again noted.  Lungs are mildly hypoinflated. Attenuation of pulmonary markings suggest underlying COPD. No pulmonary edema or pleural effusion. No focal infiltrates to suggest acute infectious pneumonitis. He C opacity overlying the peripheral left upper and mid lung favored to reflect summation of shadows as the patient is slightly rotated to the left on frontal projection.  Remotely healed left-sided rib fractures noted. Diffuse osteopenia present. Accentuation of the normal thoracic kyphosis with multilevel degenerative changes seen throughout the visualized spine.  IMPRESSION: 1. No active cardiopulmonary disease. 2. Emphysema. 3. Stable cardiomegaly without pulmonary edema.   Electronically Signed   By: Rise Mu M.D.   On: 06/17/2014 20:34      Dg Chest 2 View  06/13/2014   CLINICAL DATA:  Left anterior rib pain, vomiting. Reported history of esophageal stricture.  IMPRESSION: Low lung volume exam without focal acute finding. Moderate hiatal hernia. Esophageal stricture or achalasia could appear similar.   Electronically Signed   By: Christiana Pellant M.D.   On: 06/13/2014 14:30   Dg Ribs Unilateral W/chest Left  06/02/2014   CLINICAL DATA:  Status post fall 3 weeks ago now with 3-4 days of left breast pain radiating to the back IMPRESSION: There are multiple fractures of  the posterior upper left ribs and right fifth rib which were visible on the previous study. These are of uncertain age. There is also a fracture of the anterior aspect of the left ninth rib which appears acute.   Electronically Signed   By: David  [place]   On: 06/02/2014 13:42   Dg Sacrum/coccyx  06/02/2014   CLINICAL DATA:  Fall.  Initial encounter    IMPRESSION: 1. No acute osseous findings. Note that stool and gas limit sensitivity in the frontal projection. 2. L5-S1 facet degeneration with chronic anterolisthesis.   Electronically Signed   By: Tiburcio Pea M.D.   On: 06/02/2014 13:41       EKG Interpretation   Date/Time:  Tuesday June 17 2014 19:36:19 EST Ventricular Rate:  97 PR Interval:  163 QRS Duration: 87 QT Interval:  353 QTC Calculation: 448 R Axis:   -35 Text Interpretation:  Sinus rhythm Atrial premature complex Left axis  deviation Low voltage, extremity leads Consider anterior infarct Confirmed  by Jennamarie Goings  MD-I, Zanai Mallari ([id number]) on 06/17/2014 8:05:24 PM      MDM   Final diagnoses:  Hypoglycemia  Metabolic acidosis  Renal insufficiency  Esophageal stricture    Plan admission    Devoria Albe, MD, Franz Dell, MD 06/17/14 2128

## 2014-06-17 NOTE — ED Notes (Addendum)
Family at bedside. 

## 2014-06-18 ENCOUNTER — Telehealth: Payer: Self-pay | Admitting: Internal Medicine

## 2014-06-18 ENCOUNTER — Ambulatory Visit: Payer: Medicare HMO | Admitting: Gastroenterology

## 2014-06-18 ENCOUNTER — Observation Stay (HOSPITAL_COMMUNITY): Payer: Medicare HMO

## 2014-06-18 DIAGNOSIS — I10 Essential (primary) hypertension: Secondary | ICD-10-CM | POA: Diagnosis not present

## 2014-06-18 DIAGNOSIS — R0600 Dyspnea, unspecified: Secondary | ICD-10-CM | POA: Diagnosis present

## 2014-06-18 DIAGNOSIS — G8929 Other chronic pain: Secondary | ICD-10-CM | POA: Diagnosis not present

## 2014-06-18 DIAGNOSIS — R1314 Dysphagia, pharyngoesophageal phase: Secondary | ICD-10-CM | POA: Diagnosis not present

## 2014-06-18 DIAGNOSIS — N179 Acute kidney failure, unspecified: Secondary | ICD-10-CM

## 2014-06-18 DIAGNOSIS — K440 Diaphragmatic hernia with obstruction, without gangrene: Secondary | ICD-10-CM | POA: Diagnosis present

## 2014-06-18 DIAGNOSIS — E162 Hypoglycemia, unspecified: Secondary | ICD-10-CM | POA: Diagnosis not present

## 2014-06-18 DIAGNOSIS — D638 Anemia in other chronic diseases classified elsewhere: Secondary | ICD-10-CM | POA: Diagnosis not present

## 2014-06-18 DIAGNOSIS — M810 Age-related osteoporosis without current pathological fracture: Secondary | ICD-10-CM | POA: Diagnosis not present

## 2014-06-18 DIAGNOSIS — Z808 Family history of malignant neoplasm of other organs or systems: Secondary | ICD-10-CM | POA: Diagnosis not present

## 2014-06-18 DIAGNOSIS — K219 Gastro-esophageal reflux disease without esophagitis: Secondary | ICD-10-CM | POA: Diagnosis not present

## 2014-06-18 DIAGNOSIS — I4891 Unspecified atrial fibrillation: Secondary | ICD-10-CM | POA: Diagnosis not present

## 2014-06-18 DIAGNOSIS — Z90710 Acquired absence of both cervix and uterus: Secondary | ICD-10-CM | POA: Diagnosis not present

## 2014-06-18 DIAGNOSIS — E860 Dehydration: Secondary | ICD-10-CM | POA: Diagnosis not present

## 2014-06-18 DIAGNOSIS — E872 Acidosis, unspecified: Secondary | ICD-10-CM

## 2014-06-18 DIAGNOSIS — Z833 Family history of diabetes mellitus: Secondary | ICD-10-CM | POA: Diagnosis not present

## 2014-06-18 DIAGNOSIS — Z806 Family history of leukemia: Secondary | ICD-10-CM | POA: Diagnosis not present

## 2014-06-18 DIAGNOSIS — Z8249 Family history of ischemic heart disease and other diseases of the circulatory system: Secondary | ICD-10-CM | POA: Diagnosis not present

## 2014-06-18 DIAGNOSIS — Z87891 Personal history of nicotine dependence: Secondary | ICD-10-CM | POA: Diagnosis not present

## 2014-06-18 DIAGNOSIS — I359 Nonrheumatic aortic valve disorder, unspecified: Secondary | ICD-10-CM

## 2014-06-18 DIAGNOSIS — K222 Esophageal obstruction: Secondary | ICD-10-CM | POA: Diagnosis not present

## 2014-06-18 LAB — GLUCOSE, CAPILLARY
GLUCOSE-CAPILLARY: 106 mg/dL — AB (ref 70–99)
GLUCOSE-CAPILLARY: 131 mg/dL — AB (ref 70–99)
GLUCOSE-CAPILLARY: 133 mg/dL — AB (ref 70–99)
GLUCOSE-CAPILLARY: 78 mg/dL (ref 70–99)
GLUCOSE-CAPILLARY: 80 mg/dL (ref 70–99)
Glucose-Capillary: 136 mg/dL — ABNORMAL HIGH (ref 70–99)
Glucose-Capillary: 77 mg/dL (ref 70–99)
Glucose-Capillary: 130 mg/dL — ABNORMAL HIGH (ref 70–99)
Glucose-Capillary: 123 mg/dL — ABNORMAL HIGH (ref 70–99)
Glucose-Capillary: 134 mg/dL — ABNORMAL HIGH (ref 70–99)
Glucose-Capillary: 163 mg/dL — ABNORMAL HIGH (ref 70–99)

## 2014-06-18 LAB — COMPREHENSIVE METABOLIC PANEL
ALT: 49 U/L — AB (ref 0–35)
AST: 56 U/L — ABNORMAL HIGH (ref 0–37)
Albumin: 3.3 g/dL — ABNORMAL LOW (ref 3.5–5.2)
Alkaline Phosphatase: 80 U/L (ref 39–117)
Anion gap: 18 — ABNORMAL HIGH (ref 5–15)
BUN: 42 mg/dL — ABNORMAL HIGH (ref 6–23)
CALCIUM: 9.1 mg/dL (ref 8.4–10.5)
CO2: 20 mEq/L (ref 19–32)
Chloride: 102 mEq/L (ref 96–112)
Creatinine, Ser: 1.37 mg/dL — ABNORMAL HIGH (ref 0.50–1.10)
GFR calc non Af Amer: 35 mL/min — ABNORMAL LOW (ref >90)
GFR, EST AFRICAN AMERICAN: 40 mL/min — AB (ref >90)
GLUCOSE: 103 mg/dL — AB (ref 70–99)
Potassium: 4.6 mEq/L (ref 3.7–5.3)
Sodium: 140 mEq/L (ref 137–147)
TOTAL PROTEIN: 6.9 g/dL (ref 6.0–8.3)
Total Bilirubin: 0.5 mg/dL (ref 0.3–1.2)

## 2014-06-18 LAB — TROPONIN I
Troponin I: <0.3 ng/mL (ref <0.30)
Troponin I: <0.3 ng/mL (ref <0.30)

## 2014-06-18 LAB — CBC WITH DIFFERENTIAL/PLATELET
Basophils Absolute: 0 10*3/uL (ref 0.0–0.1)
Basophils Relative: 1 % (ref 0–1)
Eosinophils Absolute: 0.1 10*3/uL (ref 0.0–0.7)
Eosinophils Relative: 2 % (ref 0–5)
HEMATOCRIT: 30.2 % — AB (ref 36.0–46.0)
Hemoglobin: 10 g/dL — ABNORMAL LOW (ref 12.0–15.0)
LYMPHS ABS: 1.3 10*3/uL (ref 0.7–4.0)
Lymphocytes Relative: 30 % (ref 12–46)
MCH: 29.9 pg (ref 26.0–34.0)
MCHC: 33.1 g/dL (ref 30.0–36.0)
MCV: 90.1 fL (ref 78.0–100.0)
MONO ABS: 0.3 10*3/uL (ref 0.1–1.0)
MONOS PCT: 8 % (ref 3–12)
NEUTROS ABS: 2.5 10*3/uL (ref 1.7–7.7)
NEUTROS PCT: 59 % (ref 43–77)
Platelets: 226 10*3/uL (ref 150–400)
RBC: 3.35 MIL/uL — AB (ref 3.87–5.11)
RDW: 14.6 % (ref 11.5–15.5)
WBC: 4.3 10*3/uL (ref 4.0–10.5)

## 2014-06-18 LAB — STREP PNEUMONIAE URINARY ANTIGEN: Strep Pneumo Urinary Antigen: NEGATIVE

## 2014-06-18 LAB — LEGIONELLA ANTIGEN, URINE

## 2014-06-18 MED ORDER — DEXTROSE 5 % IV SOLN
INTRAVENOUS | Status: AC
  Filled 2014-06-18: qty 500

## 2014-06-18 MED ORDER — POLYETHYLENE GLYCOL 3350 17 G PO PACK
17.0000 g | PACK | Freq: Every day | ORAL | Status: DC
  Administered 2014-06-18 – 2014-06-21 (×3): 17 g via ORAL
  Filled 2014-06-18 (×3): qty 1

## 2014-06-18 MED ORDER — SODIUM CHLORIDE 0.9 % IV SOLN
INTRAVENOUS | Status: DC
  Administered 2014-06-19: 08:00:00 via INTRAVENOUS

## 2014-06-18 MED ORDER — PNEUMOCOCCAL VAC POLYVALENT 25 MCG/0.5ML IJ INJ
0.5000 mL | INJECTION | INTRAMUSCULAR | Status: AC
  Administered 2014-06-19: 0.5 mL via INTRAMUSCULAR
  Filled 2014-06-18: qty 0.5

## 2014-06-18 MED ORDER — BENZONATATE 100 MG PO CAPS: 100.0000 mg | ORAL_CAPSULE | Freq: Two times a day (BID) | ORAL | Status: DC | PRN

## 2014-06-18 MED ORDER — DEXTROSE 5 % IV SOLN
INTRAVENOUS | Status: AC
  Filled 2014-06-18: qty 10

## 2014-06-18 NOTE — Progress Notes (Signed)
Pt reported "I haven't had a bowel movement in about a week." ABD soft, non-tender, no distention noted, bowel sounds active. MD notified, new order given for miralax.

## 2014-06-18 NOTE — Plan of Care (Signed)
Problem: ICU Phase Progression Outcomes Goal: Voiding-avoid urinary catheter unless indicated Outcome: Completed/Met Date Met:  06/18/14

## 2014-06-18 NOTE — Plan of Care (Signed)
Problem: Consults Goal: Respiratory Problems Patient Education See Patient Education Module for education specifics.  Outcome: Completed/Met Date Met:  06/18/14

## 2014-06-18 NOTE — Plan of Care (Signed)
Problem: Consults Goal: Skin Care Protocol Initiated - if Braden Score 18 or less If consults are not indicated, leave blank or document N/A  Outcome: Completed/Met Date Met:  06/18/14     

## 2014-06-18 NOTE — Care Management Note (Addendum)
    Page 1 of 1   06/20/2014     3:20:13 PM CARE MANAGEMENT NOTE 06/20/2014  Patient:  Gloria [name],Gloria [name]   Account Number:  [id number]  Date Initiated:  06/18/2014  Documentation initiated by:  Sharrie RothmanBLACKWELL,Lether Tesch C  Subjective/Objective Assessment:   Pt admitted from home with dyspnea.Pt lives alone and will return home at discharge. Pt is independent with ADL's. Pt still drives to MD appts.     Action/Plan:   No CM needs noted.   Anticipated DC Date:  06/20/2014   Anticipated DC Plan:  HOME/SELF CARE      DC Planning Services  CM consult      PAC Choice  DURABLE MEDICAL EQUIPMENT   Choice offered to / List presented to:  C-1 Patient   DME arranged  WHEELCHAIR - MANUAL      DME agency  [facility]        Status of service:  Completed, signed off Medicare Important Message given?  YES (If response is "NO", the following Medicare IM given date fields will be blank) Date Medicare IM given:  06/20/2014 Medicare IM given by:  Sharrie RothmanBLACKWELL,Jazalynn Mireles C Date Additional Medicare IM given:   Additional Medicare IM given by:    Discharge Disposition:  HOME/SELF CARE  Per UR Regulation:    If discussed at Long Length of Stay Meetings, dates discussed:    Comments:  06/20/14 1505 Arlyss Queenammy Anona Giovannini, RN BSN CM Anticipate discharge 06/21/14. Pts family did request wheelchair for MD appts. Prescription given to pts daughter [facility] No other CM needs noted. Pt and pts nurse aware of discharge arrangements.  06/18/14 1115 Arlyss Queenammy Princessa Lesmeister, RN BSN CM

## 2014-06-18 NOTE — Plan of Care (Signed)
Problem: ICU Phase Progression Outcomes Goal: Flu/PneumoVaccines if indicated Outcome: Completed/Met Date Met:  06/18/14 Pt refuses flu shot but will take PNU shots

## 2014-06-18 NOTE — Plan of Care (Signed)
Problem: ICU Phase Progression Outcomes Goal: Dyspnea controlled at rest Outcome: Completed/Met Date Met:  06/18/14

## 2014-06-18 NOTE — Clinical Social Work Note (Signed)
CSW received referral for possible home health. CM notified and CSW will sign off.  Derenda FennelKara Climmie Buelow, [place] [phone]

## 2014-06-18 NOTE — Progress Notes (Signed)
SLP Cancellation Note  Patient Details Name: Gloria MattersJane M Patron MRN: [id number] DOB: 12/05/1929   Cancelled treatment:       Reason Eval/Treat Not Completed: Other (comment); Acknowledge SLP consult. Chart reviewed. Pt was supposed to see GI Gerrit Halls(Anna Sams) as an outpatient today for dysphagia, however is now admitted. SLP unable to see pt today, but will check tomorrow. Pt may benefit more from barium swallow vs MBSS.  Thank you,  Havery MorosDabney Porter, CCC-SLP [phone]    PORTER,DABNEY 06/18/2014, 3:28 PM

## 2014-06-18 NOTE — Telephone Encounter (Signed)
[facility] to let [facility] know that the patient had been admitted to the hospital. She didn't know if she was to see the patient or if patient needed a GI consult. She said she would get with the hospitalist.  I told her that I would let [facility] be aware that patient would not be here today.

## 2014-06-18 NOTE — Progress Notes (Signed)
  Echocardiogram 2D Echocardiogram has been performed.  Gloria [name] 06/18/2014, 4:30 PM

## 2014-06-18 NOTE — Consult Note (Signed)
Reason for Consult:dysphagia Referring Physician: Hospitalist Gloria [name] is an 80 y.o. female.  HPI: Admitted thru the ED last night for dyspnea. SOB x 2 days. She also had pain in here esophagus. She says this past Friday shewas in the ED for dysphagia. She had eaten some eggs Friday morning and did not go down. She vomited the egg up. She has been on a liquid since. This am she did eat scrambled eggs without any problem. She has a hx of dysphagia and has been dilated by Dr.Rourk in the past. Her last EGD/ED was 06/2013 by Dr. Gala Romney.  Several EGD/EDs in the past No NSAIDs. For the most part, her appetite is good. She says she is not SOB now. Denies prior hx of being SOB.  Chest xray revealed no acute process.  Past Medical History  Diagnosis Date  . Allergy   . B12 deficiency   . Hypertension   . Atrial fibrillation 2011  . GERD (gastroesophageal reflux disease)   . Osteoporosis   . Recurrent UTI   . Esophageal stricture   . Arthritis   . Diverticulosis   . Normocytic anemia   . Chronic epigastric pain     EM Dr. Sharlett Iles 2006, low LES pressure, high Demeester, unclear if pt on suppression  . Lumbar stenosis   . Chronic anemia     without evidence of iron deficiency in 2005/2008  . Degenerative disc disease     Past Surgical History  Procedure Laterality Date  . Tonsillectomy    . Abdominal hysterectomy  1969    partial, no cancer  . Appendectomy    . Cholecystectomy  1999  . Esophagogastroduodenoscopy  03/19/10    noncritical appearing Schatzki's ring/small hiatal hernia, adenomatous-appearing mucosa-bx benign  . Esophagogastroduodenoscopy  06/04/09    Dr Bryson Ha  . Colonoscopy  2005    reported Dr April Holding  . Esophagogastroduodenoscopy (egd) with esophageal dilation N/A 06/03/2013    Procedure: ESOPHAGOGASTRODUODENOSCOPY (EGD) WITH ESOPHAGEAL DILATION;  Surgeon: Daneil Dolin, MD;  Location: [facility];  Service: Endoscopy;  Laterality:  N/A;  2:30    Family History  Problem Relation Age of Onset  . Hypertension Mother   . Hypertension Father   . Leukemia Father   . Diabetes Brother   . Cancer Brother     melanoma,bone, metastatic unk origin    Social History:  reports that she quit smoking about 3 years ago. Her smoking use included Cigarettes. She has a 6 pack-year smoking history. She does not have any smokeless tobacco history on file. She reports that she does not drink alcohol or use illicit drugs.  Allergies:  Allergies  Allergen Reactions  . Propoxyphene N-Acetaminophen Other (See Comments)    REACTION: Hallucinations  . Caffeine     REACTION: Heart racing  . Dexlansoprazole     GI upset  . Hydromorphone Hcl Other (See Comments)    hallucination  . Ibuprofen Other (See Comments)    REACTION: Decreased hearing  . Morphine Other (See Comments)    REACTION: Hallucinations  . Propoxyphene Hcl Other (See Comments)    REACTION: Hallucinations  . Meperidine Hcl Palpitations    REACTION: Severe palpatations  . Penicillins Rash  . Sulfamethoxazole-Trimethoprim Rash    REACTION: Rash    Medications: I have reviewed the patient's current medications.  Results for orders placed or performed during the hospital encounter of 06/17/14 (from the past 48 hour(s))  Troponin I     Status: None  Collection Time: 06/17/14  7:43 PM  Result Value Ref Range   Troponin I <0.30 <0.30 ng/mL    Comment:        Due to the release kinetics of cTnI, a negative result within the first hours of the onset of symptoms does not rule out myocardial infarction with certainty. If myocardial infarction is still suspected, repeat the test at appropriate intervals.   Comprehensive metabolic panel     Status: Abnormal   Collection Time: 06/17/14  7:43 PM  Result Value Ref Range   Sodium 135 (L) 137 - 147 mEq/L   Potassium 5.1 3.7 - 5.3 mEq/L   Chloride 95 (L) 96 - 112 mEq/L   CO2 14 (L) 19 - 32 mEq/L   Glucose, Bld 51 (L)  70 - 99 mg/dL   BUN 53 (H) 6 - 23 mg/dL   Creatinine, Ser 1.71 (H) 0.50 - 1.10 mg/dL   Calcium 9.4 8.4 - 10.5 mg/dL   Total Protein 7.9 6.0 - 8.3 g/dL   Albumin 3.8 3.5 - 5.2 g/dL   AST 66 (H) 0 - 37 U/L   ALT 58 (H) 0 - 35 U/L   Alkaline Phosphatase 93 39 - 117 U/L   Total Bilirubin 0.6 0.3 - 1.2 mg/dL   GFR calc non Af Amer 27 (L) >90 mL/min   GFR calc Af Amer 31 (L) >90 mL/min    Comment: (NOTE) The eGFR has been calculated using the CKD EPI equation. This calculation has not been validated in all clinical situations. eGFR's persistently <90 mL/min signify possible Chronic Kidney Disease.    Anion gap 26 (H) 5 - 15  CBC with Differential     Status: Abnormal   Collection Time: 06/17/14  7:43 PM  Result Value Ref Range   WBC 5.0 4.0 - 10.5 K/uL   RBC 3.53 (L) 3.87 - 5.11 MIL/uL   Hemoglobin 10.5 (L) 12.0 - 15.0 g/dL   HCT 32.2 (L) 36.0 - 46.0 %   MCV 91.2 78.0 - 100.0 fL   MCH 29.7 26.0 - 34.0 pg   MCHC 32.6 30.0 - 36.0 g/dL   RDW 14.7 11.5 - 15.5 %   Platelets 245 150 - 400 K/uL   Neutrophils Relative % 59 43 - 77 %   Neutro Abs 3.0 1.7 - 7.7 K/uL   Lymphocytes Relative 31 12 - 46 %   Lymphs Abs 1.6 0.7 - 4.0 K/uL   Monocytes Relative 7 3 - 12 %   Monocytes Absolute 0.3 0.1 - 1.0 K/uL   Eosinophils Relative 2 0 - 5 %   Eosinophils Absolute 0.1 0.0 - 0.7 K/uL   Basophils Relative 1 0 - 1 %   Basophils Absolute 0.0 0.0 - 0.1 K/uL  Pro b natriuretic peptide (BNP)     Status: Abnormal   Collection Time: 06/17/14  7:43 PM  Result Value Ref Range   Pro B Natriuretic peptide (BNP) 3241.0 (H) 0 - 450 pg/mL  CBG monitoring, ED     Status: Abnormal   Collection Time: 06/17/14  9:42 PM  Result Value Ref Range   Glucose-Capillary 237 (H) 70 - 99 mg/dL  Troponin I     Status: None   Collection Time: 06/17/14 10:00 PM  Result Value Ref Range   Troponin I <0.30 <0.30 ng/mL    Comment:        Due to the release kinetics of cTnI, a negative result within the first hours of  the onset of  symptoms does not rule out myocardial infarction with certainty. If myocardial infarction is still suspected, repeat the test at appropriate intervals.   Culture, blood (routine x 2)     Status: None (Preliminary result)   Collection Time: 06/17/14 10:00 PM  Result Value Ref Range   Specimen Description RIGHT ANTECUBITAL    Special Requests BOTTLES DRAWN AEROBIC AND ANAEROBIC 8CC EACH    Culture NO GROWTH 1 DAY    Report Status PENDING   Culture, blood (routine x 2)     Status: None (Preliminary result)   Collection Time: 06/17/14 10:00 PM  Result Value Ref Range   Specimen Description LEFT ANTECUBITAL    Special Requests BOTTLES DRAWN AEROBIC AND ANAEROBIC 10CC EACH    Culture NO GROWTH 1 DAY    Report Status PENDING   Lactic acid, plasma     Status: None   Collection Time: 06/17/14 10:15 PM  Result Value Ref Range   Lactic Acid, Venous 1.2 0.5 - 2.2 mmol/L  Glucose, capillary     Status: Abnormal   Collection Time: 06/18/14 12:17 AM  Result Value Ref Range   Glucose-Capillary 106 (H) 70 - 99 mg/dL  Strep pneumoniae urinary antigen     Status: None   Collection Time: 06/18/14  1:42 AM  Result Value Ref Range   Strep Pneumo Urinary Antigen NEGATIVE NEGATIVE    Comment:        Infection due to S. pneumoniae cannot be absolutely ruled out since the antigen present may be below the detection limit of the test. Performed at [facility]   Glucose, capillary     Status: Abnormal   Collection Time: 06/18/14  3:05 AM  Result Value Ref Range   Glucose-Capillary 136 (H) 70 - 99 mg/dL  Comprehensive metabolic panel     Status: Abnormal   Collection Time: 06/18/14  4:16 AM  Result Value Ref Range   Sodium 140 137 - 147 mEq/L   Potassium 4.6 3.7 - 5.3 mEq/L   Chloride 102 96 - 112 mEq/L   CO2 20 19 - 32 mEq/L   Glucose, Bld 103 (H) 70 - 99 mg/dL   BUN 42 (H) 6 - 23 mg/dL   Creatinine, Ser 1.37 (H) 0.50 - 1.10 mg/dL   Calcium 9.1 8.4 - 10.5 mg/dL   Total  Protein 6.9 6.0 - 8.3 g/dL   Albumin 3.3 (L) 3.5 - 5.2 g/dL   AST 56 (H) 0 - 37 U/L   ALT 49 (H) 0 - 35 U/L   Alkaline Phosphatase 80 39 - 117 U/L   Total Bilirubin 0.5 0.3 - 1.2 mg/dL   GFR calc non Af Amer 35 (L) >90 mL/min   GFR calc Af Amer 40 (L) >90 mL/min    Comment: (NOTE) The eGFR has been calculated using the CKD EPI equation. This calculation has not been validated in all clinical situations. eGFR's persistently <90 mL/min signify possible Chronic Kidney Disease.    Anion gap 18 (H) 5 - 15  CBC WITH DIFFERENTIAL     Status: Abnormal   Collection Time: 06/18/14  4:16 AM  Result Value Ref Range   WBC 4.3 4.0 - 10.5 K/uL   RBC 3.35 (L) 3.87 - 5.11 MIL/uL   Hemoglobin 10.0 (L) 12.0 - 15.0 g/dL   HCT 30.2 (L) 36.0 - 46.0 %   MCV 90.1 78.0 - 100.0 fL   MCH 29.9 26.0 - 34.0 pg   MCHC 33.1 30.0 - 36.0 g/dL  RDW 14.6 11.5 - 15.5 %   Platelets 226 150 - 400 K/uL   Neutrophils Relative % 59 43 - 77 %   Neutro Abs 2.5 1.7 - 7.7 K/uL   Lymphocytes Relative 30 12 - 46 %   Lymphs Abs 1.3 0.7 - 4.0 K/uL   Monocytes Relative 8 3 - 12 %   Monocytes Absolute 0.3 0.1 - 1.0 K/uL   Eosinophils Relative 2 0 - 5 %   Eosinophils Absolute 0.1 0.0 - 0.7 K/uL   Basophils Relative 1 0 - 1 %   Basophils Absolute 0.0 0.0 - 0.1 K/uL  Troponin I     Status: None   Collection Time: 06/18/14  4:16 AM  Result Value Ref Range   Troponin I <0.30 <0.30 ng/mL    Comment:        Due to the release kinetics of cTnI, a negative result within the first hours of the onset of symptoms does not rule out myocardial infarction with certainty. If myocardial infarction is still suspected, repeat the test at appropriate intervals.   Glucose, capillary     Status: None   Collection Time: 06/18/14  5:10 AM  Result Value Ref Range   Glucose-Capillary 80 70 - 99 mg/dL  Glucose, capillary     Status: None   Collection Time: 06/18/14  6:54 AM  Result Value Ref Range   Glucose-Capillary 78 70 - 99 mg/dL   Glucose, capillary     Status: None   Collection Time: 06/18/14  7:42 AM  Result Value Ref Range   Glucose-Capillary 77 70 - 99 mg/dL   Comment 1 Notify RN   Troponin I     Status: None   Collection Time: 06/18/14  9:56 AM  Result Value Ref Range   Troponin I <0.30 <0.30 ng/mL    Comment:        Due to the release kinetics of cTnI, a negative result within the first hours of the onset of symptoms does not rule out myocardial infarction with certainty. If myocardial infarction is still suspected, repeat the test at appropriate intervals.   Glucose, capillary     Status: Abnormal   Collection Time: 06/18/14 10:12 AM  Result Value Ref Range   Glucose-Capillary 130 (H) 70 - 99 mg/dL   Comment 1 Notify RN   Glucose, capillary     Status: Abnormal   Collection Time: 06/18/14 11:47 AM  Result Value Ref Range   Glucose-Capillary 123 (H) 70 - 99 mg/dL  Glucose, capillary     Status: Abnormal   Collection Time: 06/18/14  2:34 PM  Result Value Ref Range   Glucose-Capillary 134 (H) 70 - 99 mg/dL   Comment 1 Notify RN     Dg Chest 2 View  06/17/2014   CLINICAL DATA:  Initial evaluation for shortness of Breath. History of hypertension, atrial fibrillation. Former smoker.  EXAM: CHEST  2 VIEW  COMPARISON:  Prior radiograph from 06/13/2014.  FINDINGS: Cardiomegaly is stable from prior study. Mediastinal silhouette within normal limits. Tortuosity the intrathoracic aorta noted. Right rib bowing of the trachea at the aortic knob is stable. Hiatal hernia again noted.  Lungs are mildly hypoinflated. Attenuation of pulmonary markings suggest underlying COPD. No pulmonary edema or pleural effusion. No focal infiltrates to suggest acute infectious pneumonitis. He C opacity overlying the peripheral left upper and mid lung favored to reflect summation of shadows as the patient is slightly rotated to the left on frontal projection.  Remotely healed  left-sided rib fractures noted. Diffuse osteopenia  present. Accentuation of the normal thoracic kyphosis with multilevel degenerative changes seen throughout the visualized spine.  IMPRESSION: 1. No active cardiopulmonary disease. 2. Emphysema. 3. Stable cardiomegaly without pulmonary edema.   Electronically Signed   By: Jeannine Boga M.D.   On: 06/17/2014 20:34   Dg Chest Port 1 View  06/18/2014   CLINICAL DATA:  Shortness of breath and mid chest pain.  Dyspnea.  EXAM: PORTABLE CHEST - 1 VIEW  COMPARISON:  06/17/2014 and 06/13/2014  FINDINGS: There is tortuosity of the thoracic aorta. Overall heart size is within normal limits. The lungs are clear. Multiple old rib fractures. Marked accentuation of the thoracic kyphosis. No effusions.  IMPRESSION: No acute abnormalities.   Electronically Signed   By: Rozetta Nunnery M.D.   On: 06/18/2014 11:05    ROS Blood pressure 150/66, pulse 89, temperature 98.7 F (37.1 C), temperature source Oral, resp. rate 20, height 5' (1.524 m), weight 105 lb 9.6 oz (47.9 kg), SpO2 99 %. Physical Exam Alert and oriented. Skin warm and dry. Oral mucosa is moist.   . Sclera anicteric, conjunctivae is pink. Thyroid not enlarged. No cervical lymphadenopathy. Lungs clear. Heart regular rate and rhythm.  Abdomen is soft. Bowel sounds are positive. No hepatomegaly. No abdominal masses felt. No tenderness.  No edema to lower extremities.  Assessment/Plan: Solid food dysphagia. Will discuss with Dr. Laural Golden.  She will be evaluated by SLP.  SETZER,TERRI W 06/18/2014, 3:23 PM   GI attending note; Patient interviewed and examined. Lab and imaging studies reviewed. Patient was admitted with shortness of breath but chest film negative for CHF or pneumonia. ProBNP was elevated on admission troponin level normal and echo is pending.  GI issues are as follows; #1. Esophageal dysphagia. She has history of Schatzki's ring and has undergone multiple dilations in the past most recently 1 year ago. Suspect she had esophageal food  impaction when she came to emergency room on 06/13/2014. She is now been able to swallow liquids and solids but at risk for recurrent for impaction. #2. Anemia appears to be of chronic disease. No evidence of word GI bleed. #3. Weight loss of over 15 pounds this year. Weight loss does not appear to be due to diminished oral intake as she has good appetite. Last TSH was normal in April 2015.  Recommendations; Will schedule patient for EGD with ED by Dr. Gala Romney in a.m. Happen will be discontinued after 10 PM dose tonight.

## 2014-06-18 NOTE — Plan of Care (Signed)
Problem: ICU Phase Progression Outcomes Goal: Pain controlled with appropriate interventions Outcome: Completed/Met Date Met:  06/18/14

## 2014-06-18 NOTE — Telephone Encounter (Signed)
Noted. Yes, she is inpatient now. We can follow-up as outpatient.

## 2014-06-18 NOTE — Progress Notes (Signed)
INITIAL NUTRITION ASSESSMENT  DOCUMENTATION CODES Per approved criteria  -Non-severe (moderate) malnutrition in the context of chronic  Pt meets criteria for moderate MALNUTRITION in the context of chronic illness as evidenced by weight loss of 11% in 5 months, mild wasting to clavicle, patellar regions and fat mass depletion to upper arms.  INTERVENTION: Magic cup BID with meals, each supplement provides 290 kcal and 9 grams of protein  NUTRITION DIAGNOSIS: Inadequate oral intake related to swallow difficulty??  as evidenced by severe wt loss of 6% in < 30 days and  11% in the past  5 months, clear liquids 3-4 days prior to admission.  Goal: Pt to meet >/= 90% of their estimated nutrition needs     Monitor:  Results of ST evaluation, po (meal and supplement)  intake, labs and wt trends   Reason for Assessment: Malnutrition Screen   80 y.o. female   ASSESSMENT: Gloria [name] is a 80 y.o. year old female presenting with dyspnea, AKI, chest pain. significant past medical history of esophageal stricture s/p multiple dilatations-most recent 2011, anemia, GERD presenting with dyspnea, AKI, hypoglyemia, chest pain. Pt noted to have had episode of ? Food stuck in her esophagus 3-4 days ago. Since this point, pt has had recurrent cough and SOB. Daughter reports pt w/ prior hx/o aspiration in the past. She has been taking only clear liquids past few days. No BM in past 8 days. She has severe wt loss of 6% in < 30 days and  11% in the past  5 months.  Nutrition Focused Physical Exam:  Subcutaneous Fat:  Orbital Region: WDL Upper Arm Region: mild depletion Thoracic and Lumbar Region: mild depletion  Muscle:  Temple Region: WDL Clavicle Bone Region: mild-moderate wasting Clavicle and Acromion Bone Region: mild-moderate wasting Scapular Bone Region: not assessed Dorsal Hand: mild wasting Patellar Region: mild-moderate wasting Anterior Thigh Region: moderate wasting Posterior Calf  Region: not assessed  Edema: none   Height: Ht Readings from Last 1 Encounters:  06/17/14 5' (1.524 m)    Weight: Wt Readings from Last 1 Encounters:  06/17/14 105 lb 9.6 oz (47.9 kg)    Ideal Body Weight: 100#  % Ideal Body Weight: 105%  Wt Readings from Last 10 Encounters:  06/17/14 105 lb 9.6 oz (47.9 kg)  06/13/14 108 lb (48.988 kg)  06/12/14 108 lb (48.988 kg)  06/02/14 113 lb (51.256 kg)  05/19/14 113 lb (51.256 kg)  04/30/14 110 lb (49.896 kg)  01/22/14 116 lb (52.617 kg)  01/08/14 119 lb (53.978 kg)  12/25/13 119 lb (53.978 kg)  10/31/13 122 lb (55.339 kg)    Usual Body Weight: 119-122#  % Usual Body Weight: 89%  BMI:  Body mass index is 20.62 kg/(m^2). normal range  Estimated Nutritional Needs: Kcal:  1500-1650 (to prevent further weight loss) Protein: 62-75 gr Fluid: 1500 ml daily  Skin: no issues  Diet Order: DIET DYS 3  EDUCATION NEEDS: -No education needs identified at this time   Intake/Output Summary (Last 24 hours) at 06/18/14 1034 Last data filed at 06/18/14 0923  Gross per 24 hour  Intake    606 ml  Output   1301 ml  Net   -695 ml    Last BM: 11/10  Labs:   Recent Labs Lab 06/13/14 1709 06/17/14 1943 06/18/14 0416  NA 142 135* 140  K 4.4 5.1 4.6  CL 106 95* 102  CO2 21 14* 20  BUN 54* 53* 42*  CREATININE 1.27* 1.71* 1.37*  CALCIUM 9.8 9.4 9.1  GLUCOSE 94 51* 103*    CBG (last 3)   Recent Labs  06/18/14 0654 06/18/14 0742 06/18/14 1012  GLUCAP 78 77 130*    Scheduled Meds: . azithromycin  500 mg Intravenous Q24H  . cefTRIAXone (ROCEPHIN)  IV  1 g Intravenous Q24H  . heparin  5,000 Units Subcutaneous 3 times per day  . pantoprazole  40 mg Oral Daily  . [START ON 06/19/2014] pneumococcal 23 valent vaccine  0.5 mL Intramuscular Tomorrow-1000  . polyethylene glycol  17 g Oral Daily    Continuous Infusions: . sodium chloride 85 mL/hr at 06/17/14 2324  . dextrose 5 % and 0.45% NaCl Stopped (06/17/14 2142)     Past Medical History  Diagnosis Date  . Allergy   . B12 deficiency   . Hypertension   . Atrial fibrillation 2011  . GERD (gastroesophageal reflux disease)   . Osteoporosis   . Recurrent UTI   . Esophageal stricture   . Arthritis   . Diverticulosis   . Normocytic anemia   . Chronic epigastric pain     EM Dr. Jarold MottoPatterson 2006, low LES pressure, high Demeester, unclear if pt on suppression  . Lumbar stenosis   . Chronic anemia     without evidence of iron deficiency in 2005/2008  . Degenerative disc disease     Past Surgical History  Procedure Laterality Date  . Tonsillectomy    . Abdominal hysterectomy  1969    partial, no cancer  . Appendectomy    . Cholecystectomy  1999  . Esophagogastroduodenoscopy  03/19/10    noncritical appearing Schatzki's ring/small hiatal hernia, adenomatous-appearing mucosa-bx benign  . Esophagogastroduodenoscopy  06/04/09    Dr Cherylynn RidgesGessner-dilation  . Colonoscopy  2005    reported Dr Doretha ImusPatterson-diverticulosis  . Esophagogastroduodenoscopy (egd) with esophageal dilation N/A 06/03/2013    Procedure: ESOPHAGOGASTRODUODENOSCOPY (EGD) WITH ESOPHAGEAL DILATION;  Surgeon: Corbin Adeobert M Rourk, MD;  Location: [facility];  Service: Endoscopy;  Laterality: N/A;  2:30    Royann ShiversLynn Alizea Pell MS,RD,CSG,LDN Office: [phone] Pager: [phone]

## 2014-06-18 NOTE — Progress Notes (Signed)
[facility] PROGRESS NOTE  Gloria MattersJane M [name] GNF:[id number] DOB: 05/10/1930 DOA: 06/17/2014 PCP: Gloria CoveyBURCHETTE,[name], MD  Assessment/Plan: Dyspnea -Resolved -Etiology unclear, question related to possible retained esophageal food contents. -Chest x-ray without infiltrates, will discontinue antibiotics. -2-D echo pending.  Acute renal failure -Likely secondary to decreased intravascular volume. -Improving on IV fluids.  High gap metabolic acidosis -Resolved. -Lactic acid normal at 1.2. -Suspect related to dehydration/starvation ketoacidosis.  History of esophageal stricture -Patient states her chest pain and acute dyspnea started about 3 days ago when she feels like food got stuck in her esophagus. -We will request GI consultation for further evaluation.  Code Status: full code Family Communication: patient only  Disposition Plan: to be determined   Consultants:  GI   Antibiotics:  none   Subjective: Feels like food is stuck in her throat. No shortness of breath  Objective: Filed Vitals:   06/17/14 2000 06/17/14 2100 06/17/14 2318 06/18/14 0500  BP: 154/86 145/87 152/72 150/66  Pulse: 100 102 98 89  Temp:   98.3 F (36.8 C) 98.7 F (37.1 C)  TempSrc:   Oral Oral  Resp: 20 18 20 20   Height:   5' (1.524 m)   Weight:   47.9 kg (105 lb 9.6 oz)   SpO2: 96% 96% 98% 99%    Intake/Output Summary (Last 24 hours) at 06/18/14 1554 Last data filed at 06/18/14 0923  Gross per 24 hour  Intake    606 ml  Output   1301 ml  Net   -695 ml   Filed Weights   06/17/14 1542 06/17/14 2318  Weight: 48.988 kg (108 lb) 47.9 kg (105 lb 9.6 oz)    Exam:   General:  Alert, awake  Cardiovascular: regular rate and rhythm  Respiratory: clear to auscultation bilaterally  Abdomen: soft, nontender, nondistended, positive bowel sounds  Extremities: no clubbing, cyanosis or edema, positive pedal pulses   Neurologic:  Grossly intact and nonfocal  Data  Reviewed: Basic Metabolic Panel:  Recent Labs Lab 06/13/14 1709 06/17/14 1943 06/18/14 0416  NA 142 135* 140  K 4.4 5.1 4.6  CL 106 95* 102  CO2 21 14* 20  GLUCOSE 94 51* 103*  BUN 54* 53* 42*  CREATININE 1.27* 1.71* 1.37*  CALCIUM 9.8 9.4 9.1   Liver Function Tests:  Recent Labs Lab 06/17/14 1943 06/18/14 0416  AST 66* 56*  ALT 58* 49*  ALKPHOS 93 80  BILITOT 0.6 0.5  PROT 7.9 6.9  ALBUMIN 3.8 3.3*   No results for input(s): LIPASE, AMYLASE in the last 168 hours. No results for input(s): AMMONIA in the last 168 hours. CBC:  Recent Labs Lab 06/13/14 1709 06/17/14 1943 06/18/14 0416  WBC 4.8 5.0 4.3  NEUTROABS 3.0 3.0 2.5  HGB 10.0* 10.5* 10.0*  HCT 31.0* 32.2* 30.2*  MCV 91.7 91.2 90.1  PLT 210 245 226   Cardiac Enzymes:  Recent Labs Lab 06/13/14 1709 06/17/14 1943 06/17/14 2200 06/18/14 0416 06/18/14 0956  TROPONINI <0.30 <0.30 <0.30 <0.30 <0.30   BNP (last 3 results)  Recent Labs  06/17/14 1943  PROBNP 3241.0*   CBG:  Recent Labs Lab 06/18/14 0654 06/18/14 0742 06/18/14 1012 06/18/14 1147 06/18/14 1434  GLUCAP 78 77 130* 123* 134*    Recent Results (from the past 240 hour(s))  Culture, blood (routine x 2)     Status: None (Preliminary result)   Collection Time: 06/17/14 10:00 PM  Result Value Ref Range Status   Specimen Description RIGHT ANTECUBITAL  Final   Special Requests BOTTLES DRAWN AEROBIC AND ANAEROBIC 8CC EACH  Final   Culture NO GROWTH 1 DAY  Final   Report Status PENDING  Incomplete  Culture, blood (routine x 2)     Status: None (Preliminary result)   Collection Time: 06/17/14 10:00 PM  Result Value Ref Range Status   Specimen Description LEFT ANTECUBITAL  Final   Special Requests BOTTLES DRAWN AEROBIC AND ANAEROBIC 10CC EACH  Final   Culture NO GROWTH 1 DAY  Final   Report Status PENDING  Incomplete     Studies: Dg Chest 2 View  06/17/2014   CLINICAL DATA:  Initial evaluation for shortness of Breath. History  of hypertension, atrial fibrillation. Former smoker.  EXAM: CHEST  2 VIEW  COMPARISON:  Prior radiograph from 06/13/2014.  FINDINGS: Cardiomegaly is stable from prior study. Mediastinal silhouette within normal limits. Tortuosity the intrathoracic aorta noted. Right rib bowing of the trachea at the aortic knob is stable. Hiatal hernia again noted.  Lungs are mildly hypoinflated. Attenuation of pulmonary markings suggest underlying COPD. No pulmonary edema or pleural effusion. No focal infiltrates to suggest acute infectious pneumonitis. He C opacity overlying the peripheral left upper and mid lung favored to reflect summation of shadows as the patient is slightly rotated to the left on frontal projection.  Remotely healed left-sided rib fractures noted. Diffuse osteopenia present. Accentuation of the normal thoracic kyphosis with multilevel degenerative changes seen throughout the visualized spine.  IMPRESSION: 1. No active cardiopulmonary disease. 2. Emphysema. 3. Stable cardiomegaly without pulmonary edema.   Electronically Signed   By: Rise MuBenjamin  McClintock M.D.   On: 06/17/2014 20:34   Dg Chest Port 1 View  06/18/2014   CLINICAL DATA:  Shortness of breath and mid chest pain.  Dyspnea.  EXAM: PORTABLE CHEST - 1 VIEW  COMPARISON:  06/17/2014 and 06/13/2014  FINDINGS: There is tortuosity of the thoracic aorta. Overall heart size is within normal limits. The lungs are clear. Multiple old rib fractures. Marked accentuation of the thoracic kyphosis. No effusions.  IMPRESSION: No acute abnormalities.   Electronically Signed   By: Geanie CooleyJim  Maxwell M.D.   On: 06/18/2014 11:05    Scheduled Meds: . heparin  5,000 Units Subcutaneous 3 times per day  . pantoprazole  40 mg Oral Daily  . [START ON 06/19/2014] pneumococcal 23 valent vaccine  0.5 mL Intramuscular Tomorrow-1000  . polyethylene glycol  17 g Oral Daily   Continuous Infusions: . sodium chloride 85 mL/hr at 06/18/14 1240  . dextrose 5 % and 0.45% NaCl  Stopped (06/17/14 2142)    Principal Problem:   Dyspnea Active Problems:   ESOPHAGEAL STRICTURE    Time spent: 35 minutes. Greater than 50% of this time was spent in direct contact with the patient coordinating care.    Gloria JanHERNANDEZ [name],[name]  [facility] Pager [phone]  If 7PM-7AM, please contact night-coverage at [email or web address], password [email or web address] 06/18/2014, 3:54 PM  LOS: 1 day

## 2014-06-19 ENCOUNTER — Encounter (HOSPITAL_COMMUNITY): Payer: Self-pay | Admitting: *Deleted

## 2014-06-19 ENCOUNTER — Encounter (HOSPITAL_COMMUNITY)
Admission: EM | Disposition: A | Discharge status: Home / Self Care | Payer: Self-pay | Source: Home / Self Care | Attending: Internal Medicine

## 2014-06-19 DIAGNOSIS — R1314 Dysphagia, pharyngoesophageal phase: Secondary | ICD-10-CM | POA: Insufficient documentation

## 2014-06-19 DIAGNOSIS — E872 Acidosis: Secondary | ICD-10-CM

## 2014-06-19 DIAGNOSIS — N179 Acute kidney failure, unspecified: Secondary | ICD-10-CM

## 2014-06-19 DIAGNOSIS — K440 Diaphragmatic hernia with obstruction, without gangrene: Secondary | ICD-10-CM | POA: Diagnosis not present

## 2014-06-19 HISTORY — PX: ESOPHAGOGASTRODUODENOSCOPY: SHX5428

## 2014-06-19 LAB — GLUCOSE, CAPILLARY
GLUCOSE-CAPILLARY: 92 mg/dL (ref 70–99)
Glucose-Capillary: 102 mg/dL — ABNORMAL HIGH (ref 70–99)
Glucose-Capillary: 113 mg/dL — ABNORMAL HIGH (ref 70–99)
Glucose-Capillary: 123 mg/dL — ABNORMAL HIGH (ref 70–99)
Glucose-Capillary: 86 mg/dL (ref 70–99)
Glucose-Capillary: 84 mg/dL (ref 70–99)
Glucose-Capillary: 82 mg/dL (ref 70–99)
Glucose-Capillary: 82 mg/dL (ref 70–99)
Glucose-Capillary: 75 mg/dL (ref 70–99)
Glucose-Capillary: 89 mg/dL (ref 70–99)

## 2014-06-19 LAB — COMPREHENSIVE METABOLIC PANEL
ALK PHOS: 68 U/L (ref 39–117)
ALT: 39 U/L — AB (ref 0–35)
ANION GAP: 12 (ref 5–15)
AST: 48 U/L — ABNORMAL HIGH (ref 0–37)
Albumin: 2.9 g/dL — ABNORMAL LOW (ref 3.5–5.2)
BUN: 17 mg/dL (ref 6–23)
CO2: 23 mEq/L (ref 19–32)
Calcium: 8.5 mg/dL (ref 8.4–10.5)
Chloride: 110 mEq/L (ref 96–112)
Creatinine, Ser: 0.86 mg/dL (ref 0.50–1.10)
GFR calc Af Amer: 71 mL/min — ABNORMAL LOW (ref >90)
GFR calc non Af Amer: 61 mL/min — ABNORMAL LOW (ref >90)
Glucose, Bld: 92 mg/dL (ref 70–99)
POTASSIUM: 3.8 meq/L (ref 3.7–5.3)
SODIUM: 145 meq/L (ref 137–147)
TOTAL PROTEIN: 6.2 g/dL (ref 6.0–8.3)
Total Bilirubin: 0.5 mg/dL (ref 0.3–1.2)

## 2014-06-19 LAB — CBC WITH DIFFERENTIAL/PLATELET
Basophils Absolute: 0 10*3/uL (ref 0.0–0.1)
Basophils Relative: 1 % (ref 0–1)
Eosinophils Absolute: 0.2 10*3/uL (ref 0.0–0.7)
Eosinophils Relative: 5 % (ref 0–5)
HEMATOCRIT: 26.5 % — AB (ref 36.0–46.0)
HEMOGLOBIN: 8.8 g/dL — AB (ref 12.0–15.0)
LYMPHS ABS: 1.6 10*3/uL (ref 0.7–4.0)
Lymphocytes Relative: 39 % (ref 12–46)
MCH: 29.8 pg (ref 26.0–34.0)
MCHC: 33.2 g/dL (ref 30.0–36.0)
MCV: 89.8 fL (ref 78.0–100.0)
MONO ABS: 0.3 10*3/uL (ref 0.1–1.0)
MONOS PCT: 6 % (ref 3–12)
NEUTROS ABS: 2 10*3/uL (ref 1.7–7.7)
Neutrophils Relative %: 49 % (ref 43–77)
Platelets: 185 10*3/uL (ref 150–400)
RBC: 2.95 MIL/uL — ABNORMAL LOW (ref 3.87–5.11)
RDW: 14.8 % (ref 11.5–15.5)
WBC: 4.1 10*3/uL (ref 4.0–10.5)

## 2014-06-19 SURGERY — EGD (ESOPHAGOGASTRODUODENOSCOPY)
Anesthesia: Moderate Sedation

## 2014-06-19 MED ORDER — LIDOCAINE VISCOUS 2 % MT SOLN
OROMUCOSAL | Status: DC | PRN
  Administered 2014-06-19: 3 mL via OROMUCOSAL

## 2014-06-19 MED ORDER — ONDANSETRON HCL 4 MG/2ML IJ SOLN
INTRAMUSCULAR | Status: AC
  Filled 2014-06-19: qty 2

## 2014-06-19 MED ORDER — STERILE WATER FOR IRRIGATION IR SOLN
Status: DC | PRN
  Administered 2014-06-19: 16:00:00

## 2014-06-19 MED ORDER — MEPERIDINE HCL 100 MG/ML IJ SOLN
INTRAMUSCULAR | Status: AC
  Filled 2014-06-19: qty 2

## 2014-06-19 MED ORDER — LIDOCAINE VISCOUS 2 % MT SOLN
OROMUCOSAL | Status: AC
  Filled 2014-06-19: qty 15

## 2014-06-19 MED ORDER — SODIUM CHLORIDE 0.9 % IV SOLN
INTRAVENOUS | Status: DC | PRN
  Administered 2014-06-19: 1000 mL via INTRAMUSCULAR

## 2014-06-19 MED ORDER — ONDANSETRON HCL 4 MG/2ML IJ SOLN
4.0000 mg | Freq: Four times a day (QID) | INTRAMUSCULAR | Status: DC | PRN
  Administered 2014-06-19 – 2014-06-20 (×2): 4 mg via INTRAVENOUS
  Filled 2014-06-19 (×2): qty 2

## 2014-06-19 MED ORDER — FENTANYL CITRATE 0.05 MG/ML IJ SOLN
INTRAMUSCULAR | Status: AC
  Filled 2014-06-19: qty 2

## 2014-06-19 MED ORDER — MIDAZOLAM HCL 5 MG/5ML IJ SOLN
INTRAMUSCULAR | Status: AC
  Filled 2014-06-19: qty 10

## 2014-06-19 MED ORDER — FENTANYL CITRATE 0.05 MG/ML IJ SOLN
INTRAMUSCULAR | Status: DC | PRN
  Administered 2014-06-19: 50 ug via INTRAVENOUS

## 2014-06-19 MED ORDER — MIDAZOLAM HCL 5 MG/5ML IJ SOLN
INTRAMUSCULAR | Status: DC | PRN
  Administered 2014-06-19: 2 mg via INTRAVENOUS

## 2014-06-19 NOTE — Discharge Instructions (Signed)
EGD Discharge instructions Please read the instructions outlined below and refer to this sheet in the next few weeks. These discharge instructions provide you with general information on caring for yourself after you leave the hospital. Your doctor may also give you specific instructions. While your treatment has been planned according to the most current medical practices available, unavoidable complications occasionally occur. If you have any problems or questions after discharge, please call your doctor. ACTIVITY You may resume your regular activity but move at a slower pace for the next 24 hours.  Take frequent rest periods for the next 24 hours.  Walking will help expel (get rid of) the air and reduce the bloated feeling in your abdomen.  No driving for 24 hours (because of the anesthesia (medicine) used during the test).  You may shower.  Do not sign any important legal documents or operate any machinery for 24 hours (because of the anesthesia used during the test).  NUTRITION Drink plenty of fluids.  You may resume your normal diet.  Begin with a light meal and progress to your normal diet.  Avoid alcoholic beverages for 24 hours or as instructed by your caregiver.  MEDICATIONS You may resume your normal medications unless your caregiver tells you otherwise.  WHAT YOU CAN EXPECT TODAY You may experience abdominal discomfort such as a feeling of fullness or "gas" pains.  FOLLOW-UP Your doctor will discuss the results of your test with you.  SEEK IMMEDIATE MEDICAL ATTENTION IF ANY OF THE FOLLOWING OCCUR: Excessive nausea (feeling sick to your stomach) and/or vomiting.  Severe abdominal pain and distention (swelling).  Trouble swallowing.  Temperature over 101 F (37.8 C).  Rectal bleeding or vomiting of blood.      

## 2014-06-19 NOTE — Op Note (Signed)
[facility] [address] [place], [id number]   ENDOSCOPY PROCEDURE REPORT  PATIENT: Gloria [name], Gloria [name]  MR#: [id number] BIRTHDATE: [birth year] , 84  yrs. old GENDER: female ENDOSCOPIST: R.  Roetta SessionsMichael Morgaine Kimball, MD FACP FACG REFERRED BY:  Evelena PeatBruce Burchette, [name].D. PROCEDURE DATE:  06/19/2014 PROCEDURE:  EGD, diagnostic and Maloney dilation of esophagus INDICATIONS:  Recurrent esophageal dysphagia; history of a Schatzki's ring. MEDICATIONS: Versed 2 mg IV and fentanyl 50 g IV.  Xylocaine gel orally.  Zofran 4 mg IV. ASA CLASS:      Class III  CONSENT: The risks, benefits, limitations, alternatives and imponderables have been discussed.  The potential for biopsy, esophogeal dilation, etc. have also been reviewed.  Questions have been answered.  All parties agreeable.  Please see the history and physical in the medical record for more information.  DESCRIPTION OF PROCEDURE: After the risks benefits and alternatives of the procedure were thoroughly explained, informed consent was obtained.  The EG-[id number] ([id number] endoscope was introduced through the mouth and advanced to the second portion of the duodenum , limited by Without limitations. The instrument was slowly withdrawn as the mucosa was fully examined.    Schatzki's ring.  The esophageal mucosa otherwise appeared normal. The GE junction had someone what of a"elastic "feel as scope traversed it.  Small hiatal hernia.  Stomach empty.  Some deformity of the antrum/pylorus .Mucosa appeared normal otherwise.  Pylorus easily traversed.  Examination the first and second portion of the duodenum revealed no abnormalities.  A 56 French Maloney dilators passed full insertion easily subsequently, a 58 [place] Maloney dilator was passed with mild resistance.  Look back revealed the ring had been ruptured without apparent complication.  Retroflexed views revealed as previously described.     The scope was then withdrawn from the  patient and the procedure completed.  COMPLICATIONS: There were no immediate complications.  ENDOSCOPIC IMPRESSION: Schatzki's ring  -  status post Maloney dilation as described above. Cannot exclude a coexisting underlying esophageal motility disorder. Small hiatal hernia.  RECOMMENDATIONS: Dysphagia 3 diet. Protonix 40 mg daily. From a GI standpoint, could be discharged any time following the procedure this afternoon.   REPEAT EXAM:  eSigned:  R. Roetta SessionsMichael Gustavia Carie, MD Jerrel IvoryFACP [facility] YorkFACG 06/19/2014 5:04 PM    CC:  CPT CODES: ICD CODES:  The ICD and CPT codes recommended by this software are interpretations from the data that the clinical staff has captured with the software.  The verification of the translation of this report to the ICD and CPT codes and modifiers is the sole responsibility of the health care institution and practicing physician where this report was generated.  [employer] will not be held responsible for the validity of the ICD and CPT codes included on this report.  AMA assumes no liability for data contained or not contained herein. CPT is a Publishing rights managerregistered trademark of the [employer].  PATIENT NAME:  Gloria [name], Gloria [name] MR#: [id number]

## 2014-06-19 NOTE — Progress Notes (Signed)
[facility] PROGRESS NOTE  Gloria [name] YNW:[id number] DOB: 11/07/1929 DOA: 06/17/2014 PCP: Gloria [name],Gloria W, MD  Assessment/Plan: Dyspnea -Resolved -Etiology unclear, question related to possible retained esophageal food contents. -Chest x-ray without infiltrates, will discontinue antibiotics. -2-D echo: Left ventricle: The cavity size was normal. Wall thickness was normal. Systolic function was normal. The estimated ejection fraction was in the range of 60% to 65%. Wall motion was normal; there were no regional wall motion abnormalities. Diastolic dysfunction, grade indeterminate.  Acute renal failure -Likely secondary to decreased intravascular volume. -Resolved on IV fluids.  High gap metabolic acidosis -Resolved. -Lactic acid normal at 1.2. -Suspect related to dehydration/starvation ketoacidosis.  History of esophageal stricture -Patient states her chest pain and acute dyspnea started about 3 days ago when she feels like food got stuck in her esophagus. -GI tosee for EGD with dilatation today..? Retained food contents.  Code Status: full code Family Communication: patient only  Disposition Plan: for EGD today   Consultants:  GI   Antibiotics:  none   Subjective: Feels like food is stuck in her throat. No shortness of breath  Objective: Filed Vitals:   06/18/14 1958 06/19/14 0453 06/19/14 1430 06/19/14 1507  BP: 155/77 156/74 160/72 171/92  Pulse: 92 90 86 94  Temp: 99.3 F (37.4 C) 98 F (36.7 C) 98.7 F (37.1 C) 98 F (36.7 C)  TempSrc: Oral Oral Oral Oral  Resp: 20 20 20 23   Height:      Weight:      SpO2: 98% 96% 95% 94%    Intake/Output Summary (Last 24 hours) at 06/19/14 1543 Last data filed at 06/19/14 1300  Gross per 24 hour  Intake    240 ml  Output   1550 ml  Net  -1310 ml   Filed Weights   06/17/14 1542 06/17/14 2318  Weight: 48.988 kg (108 lb) 47.9 kg (105 lb 9.6 oz)    Exam:   General:  Alert,  awake  Cardiovascular: regular rate and rhythm  Respiratory: clear to auscultation bilaterally  Abdomen: soft, nontender, nondistended, positive bowel sounds  Extremities: no clubbing, cyanosis or edema, positive pedal pulses   Neurologic:  Grossly intact and nonfocal  Data Reviewed: Basic Metabolic Panel:  Recent Labs Lab 06/13/14 1709 06/17/14 1943 06/18/14 0416 06/19/14 0530  NA 142 135* 140 145  K 4.4 5.1 4.6 3.8  CL 106 95* 102 110  CO2 21 14* 20 23  GLUCOSE 94 51* 103* 92  BUN 54* 53* 42* 17  CREATININE 1.27* 1.71* 1.37* 0.86  CALCIUM 9.8 9.4 9.1 8.5   Liver Function Tests:  Recent Labs Lab 06/17/14 1943 06/18/14 0416 06/19/14 0530  AST 66* 56* 48*  ALT 58* 49* 39*  ALKPHOS 93 80 68  BILITOT 0.6 0.5 0.5  PROT 7.9 6.9 6.2  ALBUMIN 3.8 3.3* 2.9*   No results for input(s): LIPASE, AMYLASE in the last 168 hours. No results for input(s): AMMONIA in the last 168 hours. CBC:  Recent Labs Lab 06/13/14 1709 06/17/14 1943 06/18/14 0416 06/19/14 0530  WBC 4.8 5.0 4.3 4.1  NEUTROABS 3.0 3.0 2.5 2.0  HGB 10.0* 10.5* 10.0* 8.8*  HCT 31.0* 32.2* 30.2* 26.5*  MCV 91.7 91.2 90.1 89.8  PLT 210 245 226 185   Cardiac Enzymes:  Recent Labs Lab 06/13/14 1709 06/17/14 1943 06/17/14 2200 06/18/14 0416 06/18/14 0956  TROPONINI <0.30 <0.30 <0.30 <0.30 <0.30   BNP (last 3 results)  Recent Labs  06/17/14 1943  PROBNP 3241.0*  CBG:  Recent Labs Lab 06/19/14 0457 06/19/14 0726 06/19/14 1014 06/19/14 1116 06/19/14 1429  GLUCAP 92 86 84 82 82    Recent Results (from the past 240 hour(s))  Culture, blood (routine x 2)     Status: None (Preliminary result)   Collection Time: 06/17/14 10:00 PM  Result Value Ref Range Status   Specimen Description BLOOD RIGHT ANTECUBITAL  Final   Special Requests BOTTLES DRAWN AEROBIC AND ANAEROBIC 8CC EACH  Final   Culture NO GROWTH 2 DAYS  Final   Report Status PENDING  Incomplete  Culture, blood (routine x 2)      Status: None (Preliminary result)   Collection Time: 06/17/14 10:00 PM  Result Value Ref Range Status   Specimen Description BLOOD LEFT ANTECUBITAL  Final   Special Requests BOTTLES DRAWN AEROBIC AND ANAEROBIC 10CC EACH  Final   Culture NO GROWTH 2 DAYS  Final   Report Status PENDING  Incomplete     Studies: Dg Chest 2 View  06/17/2014   CLINICAL DATA:  Initial evaluation for shortness of Breath. History of hypertension, atrial fibrillation. Former smoker.  EXAM: CHEST  2 VIEW  COMPARISON:  Prior radiograph from 06/13/2014.  FINDINGS: Cardiomegaly is stable from prior study. Mediastinal silhouette within normal limits. Tortuosity the intrathoracic aorta noted. Right rib bowing of the trachea at the aortic knob is stable. Hiatal hernia again noted.  Lungs are mildly hypoinflated. Attenuation of pulmonary markings suggest underlying COPD. No pulmonary edema or pleural effusion. No focal infiltrates to suggest acute infectious pneumonitis. He C opacity overlying the peripheral left upper and mid lung favored to reflect summation of shadows as the patient is slightly rotated to the left on frontal projection.  Remotely healed left-sided rib fractures noted. Diffuse osteopenia present. Accentuation of the normal thoracic kyphosis with multilevel degenerative changes seen throughout the visualized spine.  IMPRESSION: 1. No active cardiopulmonary disease. 2. Emphysema. 3. Stable cardiomegaly without pulmonary edema.   Electronically Signed   By: Gloria MuBenjamin  [name] M.D.   On: 06/17/2014 20:34   Dg Chest Port 1 View  06/18/2014   CLINICAL DATA:  Shortness of breath and mid chest pain.  Dyspnea.  EXAM: PORTABLE CHEST - 1 VIEW  COMPARISON:  06/17/2014 and 06/13/2014  FINDINGS: There is tortuosity of the thoracic aorta. Overall heart size is within normal limits. The lungs are clear. Multiple old rib fractures. Marked accentuation of the thoracic kyphosis. No effusions.  IMPRESSION: No acute abnormalities.    Electronically Signed   By: Gloria CooleyJim  [name] M.D.   On: 06/18/2014 11:05    Scheduled Meds: . [MAR Hold] pantoprazole  40 mg Oral Daily  . [MAR Hold] polyethylene glycol  17 g Oral Daily   Continuous Infusions: . sodium chloride 20 mL/hr at 06/19/14 0757    Principal Problem:   Dyspnea Active Problems:   ESOPHAGEAL STRICTURE   ARF (acute renal failure)   Metabolic acidosis    Time spent: 25 minutes. Greater than 50% of this time was spent in direct contact with the patient coordinating care.    Chaya JanHERNANDEZ [name],Gloria  [facility] Pager [phone]  If 7PM-7AM, please contact night-coverage at [email or web address], password [facility] 06/19/2014, 3:43 PM  LOS: 2 days

## 2014-06-19 NOTE — Progress Notes (Signed)
SLP Cancellation Note  Patient Details Name: Gloria [name] MRN: [id number] DOB: 05/30/1930   Cancelled treatment:       Reason Eval/Treat Not Completed: Patient at procedure or test/unavailable; Case discussed with Dr. Ardyth HarpsHernandez. Pt NPO pending EGD with possible dilation with Dr. Jena Gaussourk. SLP can evaluate clinically if desired after resuming diet again, however EGD may relieve symptoms.  Thank you,  [name], CCC-SLP [phone]    [name],Gloria 06/19/2014, 11:00 AM

## 2014-06-20 ENCOUNTER — Telehealth: Payer: Self-pay | Admitting: Gastroenterology

## 2014-06-20 ENCOUNTER — Encounter (HOSPITAL_COMMUNITY): Payer: Self-pay | Admitting: Internal Medicine

## 2014-06-20 DIAGNOSIS — K222 Esophageal obstruction: Secondary | ICD-10-CM | POA: Insufficient documentation

## 2014-06-20 DIAGNOSIS — R7401 Elevation of levels of liver transaminase levels: Secondary | ICD-10-CM | POA: Insufficient documentation

## 2014-06-20 DIAGNOSIS — R740 Nonspecific elevation of levels of transaminase and lactic acid dehydrogenase [LDH]: Secondary | ICD-10-CM

## 2014-06-20 DIAGNOSIS — D638 Anemia in other chronic diseases classified elsewhere: Secondary | ICD-10-CM | POA: Insufficient documentation

## 2014-06-20 LAB — COMPREHENSIVE METABOLIC PANEL
ALK PHOS: 66 U/L (ref 39–117)
ALT: 36 U/L — ABNORMAL HIGH (ref 0–35)
AST: 44 U/L — ABNORMAL HIGH (ref 0–37)
Albumin: 2.9 g/dL — ABNORMAL LOW (ref 3.5–5.2)
Anion gap: 13 (ref 5–15)
BUN: 11 mg/dL (ref 6–23)
CHLORIDE: 108 meq/L (ref 96–112)
CO2: 24 mEq/L (ref 19–32)
Calcium: 8.6 mg/dL (ref 8.4–10.5)
Creatinine, Ser: 0.91 mg/dL (ref 0.50–1.10)
GFR, EST AFRICAN AMERICAN: 66 mL/min — AB (ref >90)
GFR, EST NON AFRICAN AMERICAN: 57 mL/min — AB (ref >90)
GLUCOSE: 65 mg/dL — AB (ref 70–99)
POTASSIUM: 3.5 meq/L — AB (ref 3.7–5.3)
Sodium: 145 mEq/L (ref 137–147)
Total Bilirubin: 0.5 mg/dL (ref 0.3–1.2)
Total Protein: 6.1 g/dL (ref 6.0–8.3)

## 2014-06-20 LAB — CBC WITH DIFFERENTIAL/PLATELET
BASOS ABS: 0 10*3/uL (ref 0.0–0.1)
BASOS PCT: 1 % (ref 0–1)
Eosinophils Absolute: 0.2 10*3/uL (ref 0.0–0.7)
Eosinophils Relative: 4 % (ref 0–5)
HCT: 28.5 % — ABNORMAL LOW (ref 36.0–46.0)
Hemoglobin: 9.3 g/dL — ABNORMAL LOW (ref 12.0–15.0)
Lymphocytes Relative: 28 % (ref 12–46)
Lymphs Abs: 1.4 10*3/uL (ref 0.7–4.0)
MCH: 30 pg (ref 26.0–34.0)
MCHC: 32.6 g/dL (ref 30.0–36.0)
MCV: 91.9 fL (ref 78.0–100.0)
MONO ABS: 0.3 10*3/uL (ref 0.1–1.0)
Monocytes Relative: 6 % (ref 3–12)
Neutro Abs: 3.2 10*3/uL (ref 1.7–7.7)
Neutrophils Relative %: 61 % (ref 43–77)
PLATELETS: 190 10*3/uL (ref 150–400)
RBC: 3.1 MIL/uL — ABNORMAL LOW (ref 3.87–5.11)
RDW: 15.1 % (ref 11.5–15.5)
WBC: 5.1 10*3/uL (ref 4.0–10.5)

## 2014-06-20 LAB — GLUCOSE, CAPILLARY
GLUCOSE-CAPILLARY: 140 mg/dL — AB (ref 70–99)
Glucose-Capillary: 82 mg/dL (ref 70–99)
Glucose-Capillary: 72 mg/dL (ref 70–99)
Glucose-Capillary: 112 mg/dL — ABNORMAL HIGH (ref 70–99)
Glucose-Capillary: 126 mg/dL — ABNORMAL HIGH (ref 70–99)

## 2014-06-20 LAB — IRON AND TIBC
Iron: 108 ug/dL (ref 42–135)
SATURATION RATIOS: 44 % (ref 20–55)
TIBC: 244 ug/dL — AB (ref 250–470)
UIBC: 136 ug/dL (ref 125–400)

## 2014-06-20 MED ORDER — TRAMADOL HCL 50 MG PO TABS
100.0000 mg | ORAL_TABLET | Freq: Four times a day (QID) | ORAL | Status: DC | PRN
  Administered 2014-06-20 – 2014-06-21 (×4): 100 mg via ORAL
  Filled 2014-06-20 (×4): qty 2

## 2014-06-20 NOTE — Evaluation (Signed)
Clinical/Bedside Swallow Evaluation Patient Details  Name: Gloria [name] MRN: [id number] Date of Birth: 10/25/1929  Today's Date: 06/20/2014 Time: 1915-1930 SLP Time Calculation (min) (ACUTE ONLY): 15 min  Past Medical History:  Past Medical History  Diagnosis Date  . Allergy   . B12 deficiency   . Hypertension   . Atrial fibrillation 2011  . GERD (gastroesophageal reflux disease)   . Osteoporosis   . Recurrent UTI   . Esophageal stricture   . Arthritis   . Diverticulosis   . Normocytic anemia   . Chronic epigastric pain     EM Dr. Jarold MottoPatterson 2006, low LES pressure, high Demeester, unclear if pt on suppression  . Lumbar stenosis   . Chronic anemia     without evidence of iron deficiency in 2005/2008  . Degenerative disc disease    Past Surgical History:  Past Surgical History  Procedure Laterality Date  . Tonsillectomy    . Abdominal hysterectomy  1969    partial, no cancer  . Appendectomy    . Cholecystectomy  1999  . Esophagogastroduodenoscopy  03/19/10    noncritical appearing Schatzki's ring/small hiatal hernia, adenomatous-appearing mucosa-bx benign  . Esophagogastroduodenoscopy  06/04/09    Dr Cherylynn RidgesGessner-dilation  . Colonoscopy  2005    reported Dr Doretha ImusPatterson-diverticulosis  . Esophagogastroduodenoscopy (egd) with esophageal dilation N/A 06/03/2013    Procedure: ESOPHAGOGASTRODUODENOSCOPY (EGD) WITH ESOPHAGEAL DILATION;  Surgeon: Corbin Adeobert M Rourk, MD;  Location: [facility];  Service: Endoscopy;  Laterality: N/A;  2:30  . Esophagogastroduodenoscopy N/A 06/19/2014    Procedure: ESOPHAGOGASTRODUODENOSCOPY (EGD) ED;  Surgeon: Corbin Adeobert M Rourk, MD;  Location: [facility];  Service: Endoscopy;  Laterality: N/A;   HPI:  This is a 80 y.o. year old female with significant past medical history of esophageal stricture s/p multiple dilatations-most recent 2011, anemia, GERD presenting with dyspnea, AKI, hypoglyemia, chest pain. Pt noted to have had episode of ? Food stuck in her  esophagus 3-4 days ago. Since this point, pt has had recurrent cough and SOB. Daughter reports pt w/ prior hx/o aspiration in the past. Denies any fevers or chills. Pt non smoker. Pt has only been drinking clears since incident. Denies any episodes of emesis. CXR insignificant. Had esophagus stretched 11/19. Pt inconsistent in reports of swallowing since procedure- told RN this a.m. that she was getting choked and that she was afraid to eat; however reported to MD that she was no longer having difficulties. Bedside swallow eval ordered to further assess swallow function/ provide recommendations.   Assessment / Plan / Recommendation Clinical Impression  Pt demonstrated no overt s/s of aspiration at bedside- swallow appeared timely with adequate hyolaryngeal excursion. At this time pt stated that she has not had any difficulties ever since procedure yesterday; however RN reported that this a.m. she was afraid to eat and drink, thinking she could not swallow. Given hx of esophageal stricture, risk of aspiration appears mild-moderate. Reviewed precautionary strategies with pt- small bites/ sips, alternating food with liquid, sit upright 60 minutes post- meal. Recommend continuing dysphagia 3/ thin liquid diet, meds whole with liquid unless difficulties arise- in which case try in puree. Intermittent supervision to cue for strategies. ST will sign off at this time- please reconsult if needs arise.    Aspiration Risk  Mild-moderate    Diet Recommendation Dysphagia 3 (Mechanical Soft);Thin liquid   Liquid Administration via: Cup;Straw Medication Administration: Whole meds with liquid Supervision: Patient able to self feed;Intermittent supervision to cue for compensatory strategies Compensations: Slow rate;Small  sips/bites;Follow solids with liquid Postural Changes and/or Swallow Maneuvers: Seated upright 90 degrees;Upright 30-60 min after meal    Other  Recommendations Oral Care Recommendations: Oral care  BID   Follow Up Recommendations  None    Frequency and Duration        Pertinent Vitals/Pain n/a    SLP Swallow Goals     Swallow Study Prior Functional Status       General HPI: This is a 80 y.o. year old female with significant past medical history of esophageal stricture s/p multiple dilatations-most recent 2011, anemia, GERD presenting with dyspnea, AKI, hypoglyemia, chest pain. Pt noted to have had episode of ? Food stuck in her esophagus 3-4 days ago. Since this point, pt has had recurrent cough and SOB. Daughter reports pt w/ prior hx/o aspiration in the past. Denies any fevers or chills. Pt non smoker. Pt has only been drinking clears since incident. Denies any episodes of emesis. CXR insignificant. Had esophagus stretched 11/19. Pt inconsistent in reports of swallowing since procedure- told RN this a.m. that she was getting choked and that she was afraid to eat; however reported to MD that she was no longer having difficulties. Bedside swallow eval ordered to further assess swallow function/ provide recommendations. Type of Study: Bedside swallow evaluation Diet Prior to this Study: Dysphagia 3 (soft);Thin liquids Temperature Spikes Noted: No Respiratory Status: Room air History of Recent Intubation: No Behavior/Cognition: Alert;Cooperative;Pleasant mood Oral Cavity - Dentition: Adequate natural dentition (partials) Self-Feeding Abilities: Able to feed self Patient Positioning: Upright in bed Baseline Vocal Quality: Clear Volitional Cough: Strong Volitional Swallow: Able to elicit    Oral/Motor/Sensory Function Overall Oral Motor/Sensory Function: Appears within functional limits for tasks assessed   Ice Chips Ice chips: Not tested   Thin Liquid Thin Liquid: Within functional limits Presentation: Cup;Straw    Nectar Thick Nectar Thick Liquid: Not tested   Honey Thick Honey Thick Liquid: Not tested   Puree Puree: Within functional limits Presentation: Self Fed;Spoon    Solid   GO    Solid: Within functional limits Presentation: Self Fed       Oleksiak, Amy K, MA, CCC-SLP 06/20/2014,7:35 PM

## 2014-06-20 NOTE — Plan of Care (Signed)
Pt continues to have difficulty eating without feeling like "it's stuck" or getting nauseous. Pt concerned about possibility of going home without being able to eat. Assured pt we would continue to re-assess.

## 2014-06-20 NOTE — Progress Notes (Signed)
[facility] PROGRESS NOTE  Gloria [name] WUJ:[id number] DOB: 11/24/1929 DOA: 06/17/2014 PCP: [name],[name], MD  Assessment/Plan: Dyspnea -Resolved -Etiology unclear, question related to esophageal stricture. -Chest x-ray without infiltrates, will discontinue antibiotics. -2-D echo: Left ventricle: The cavity size was normal. Wall thickness was normal. Systolic function was normal. The estimated ejection fraction was in the range of 60% to 65%. Wall motion was normal; there were no regional wall motion abnormalities. Diastolic dysfunction, grade indeterminate.  Acute renal failure -Likely secondary to decreased intravascular volume. -Resolved on IV fluids.  High gap metabolic acidosis -Resolved. -Lactic acid normal at 1.2. -Suspect related to dehydration/starvation ketoacidosis.  History of esophageal stricture -Patient states her chest pain and acute dyspnea started about 3 days ago when she feels like food got stuck in her esophagus. -Had esophageal dilatation 11/19. -Improved, is tolerating foods that she is used to, some textures she always has a problem with. -Continue D-III diet.  Code Status: full code Family Communication: daughter Dorita FrayScarlett at bedside updated on plan of care. Disposition Plan: home in am   Consultants:  GI   Antibiotics:  none   Subjective: Mild nausea after eating breakfast. No emesis.  Objective: Filed Vitals:   06/19/14 1716 06/19/14 2059 06/20/14 0538 06/20/14 1445  BP: 125/74 159/67 108/72 121/63  Pulse: 89  101 107  Temp: 97.9 F (36.6 C) 98.5 F (36.9 C) 98.5 F (36.9 C) 98 F (36.7 C)  TempSrc: Oral Oral Oral Oral  Resp: 15 16 16 16   Height:      Weight:      SpO2: 96% 94% 94% 95%    Intake/Output Summary (Last 24 hours) at 06/20/14 1829 Last data filed at 06/20/14 1700  Gross per 24 hour  Intake    720 ml  Output    550 ml  Net    170 ml   Filed Weights   06/17/14 1542 06/17/14 2318   Weight: 48.988 kg (108 lb) 47.9 kg (105 lb 9.6 oz)    Exam:   General:  Alert, awake  Cardiovascular: regular rate and rhythm  Respiratory: clear to auscultation bilaterally  Abdomen: soft, nontender, nondistended, positive bowel sounds  Extremities: no clubbing, cyanosis or edema, positive pedal pulses   Neurologic:  Grossly intact and nonfocal  Data Reviewed: Basic Metabolic Panel:  Recent Labs Lab 06/17/14 1943 06/18/14 0416 06/19/14 0530 06/20/14 0515  NA 135* 140 145 145  K 5.1 4.6 3.8 3.5*  CL 95* 102 110 108  CO2 14* 20 23 24   GLUCOSE 51* 103* 92 65*  BUN 53* 42* 17 11  CREATININE 1.71* 1.37* 0.86 0.91  CALCIUM 9.4 9.1 8.5 8.6   Liver Function Tests:  Recent Labs Lab 06/17/14 1943 06/18/14 0416 06/19/14 0530 06/20/14 0515  AST 66* 56* 48* 44*  ALT 58* 49* 39* 36*  ALKPHOS 93 80 68 66  BILITOT 0.6 0.5 0.5 0.5  PROT 7.9 6.9 6.2 6.1  ALBUMIN 3.8 3.3* 2.9* 2.9*   No results for input(s): LIPASE, AMYLASE in the last 168 hours. No results for input(s): AMMONIA in the last 168 hours. CBC:  Recent Labs Lab 06/17/14 1943 06/18/14 0416 06/19/14 0530 06/20/14 0515  WBC 5.0 4.3 4.1 5.1  NEUTROABS 3.0 2.5 2.0 3.2  HGB 10.5* 10.0* 8.8* 9.3*  HCT 32.2* 30.2* 26.5* 28.5*  MCV 91.2 90.1 89.8 91.9  PLT 245 226 185 190   Cardiac Enzymes:  Recent Labs Lab 06/17/14 1943 06/17/14 2200 06/18/14 0416 06/18/14 [id number]  TROPONINI <0.30 <0.30 <0.30 <0.30   BNP (last 3 results)  Recent Labs  06/17/14 1943  PROBNP 3241.0*   CBG:  Recent Labs Lab 06/19/14 2103 06/20/14 0220 06/20/14 0708 06/20/14 1006 06/20/14 1643  GLUCAP 89 82 72 112* 126*    Recent Results (from the past 240 hour(s))  Culture, blood (routine x 2)     Status: None (Preliminary result)   Collection Time: 06/17/14 10:00 PM  Result Value Ref Range Status   Specimen Description BLOOD RIGHT ANTECUBITAL  Final   Special Requests BOTTLES DRAWN AEROBIC AND ANAEROBIC 8CC EACH   Final   Culture NO GROWTH 2 DAYS  Final   Report Status PENDING  Incomplete  Culture, blood (routine x 2)     Status: None (Preliminary result)   Collection Time: 06/17/14 10:00 PM  Result Value Ref Range Status   Specimen Description BLOOD LEFT ANTECUBITAL  Final   Special Requests BOTTLES DRAWN AEROBIC AND ANAEROBIC 10CC EACH  Final   Culture NO GROWTH 2 DAYS  Final   Report Status PENDING  Incomplete     Studies: No results found.  Scheduled Meds: . pantoprazole  40 mg Oral Daily  . polyethylene glycol  17 g Oral Daily   Continuous Infusions:    Principal Problem:   Dyspnea Active Problems:   ESOPHAGEAL STRICTURE   ARF (acute renal failure)   Metabolic acidosis   Dysphagia, pharyngoesophageal phase   Esophageal stricture   Transaminitis   Anemia of chronic disease    Time spent: 25 minutes. Greater than 50% of this time was spent in direct contact with the patient coordinating care.    [name],[name]  [facility] Pager [phone]  If 7PM-7AM, please contact night-coverage at [email or web address], password [email or web address] 06/20/2014, 6:29 PM  LOS: 3 days

## 2014-06-20 NOTE — Progress Notes (Signed)
Subjective:  Patient reports swallowing much improved. Tolerated diet last night. Complains of poor control of her chronic pain. Usually takes tramadol 100mg  q 6 hours at home but receiving only 50mg  as inpatient.    Objective: Vital signs in last 24 hours: Temp:  [97.9 F (36.6 C)-98.7 F (37.1 C)] 98.5 F (36.9 C) (11/20 0538) Pulse Rate:  [86-101] 101 (11/20 0538) Resp:  [10-28] 16 (11/20 0538) BP: (108-171)/(64-105) 108/72 mmHg (11/20 0538) SpO2:  [94 %-99 %] 94 % (11/20 0538) Last BM Date: 06/18/14 General:   Chronically ill-appearing Head:  Normocephalic and atraumatic. Eyes:  Sclera clear, no icterus.   Abdomen:  Soft, nontender and nondistended.   Extremities:  Without clubbing, deformity or edema. Neurologic:  Alert and  oriented x4;  grossly normal neurologically. Skin:  Intact without significant lesions or rashes. Psych:  Alert and cooperative. Normal mood and affect.  Intake/Output from previous day: 11/19 0701 - 11/20 0700 In: -  Out: 600 [Urine:600] Intake/Output this shift: Total I/O In: 240 [P.O.:240] Out: 100 [Urine:100]  Lab Results: CBC  Recent Labs  06/18/14 0416 06/19/14 0530 06/20/14 0515  WBC 4.3 4.1 5.1  HGB 10.0* 8.8* 9.3*  HCT 30.2* 26.5* 28.5*  MCV 90.1 89.8 91.9  PLT 226 185 190   BMET  Recent Labs  06/18/14 0416 06/19/14 0530 06/20/14 0515  NA 140 145 145  K 4.6 3.8 3.5*  CL 102 110 108  CO2 20 23 24   GLUCOSE 103* 92 65*  BUN 42* 17 11  CREATININE 1.37* 0.86 0.91  CALCIUM 9.1 8.5 8.6   LFTs  Recent Labs  06/18/14 0416 06/19/14 0530 06/20/14 0515  BILITOT 0.5 0.5 0.5  ALKPHOS 80 68 66  AST 56* 48* 44*  ALT 49* 39* 36*  PROT 6.9 6.2 6.1  ALBUMIN 3.3* 2.9* 2.9*   No results for input(s): LIPASE in the last 72 hours. PT/INR No results for input(s): LABPROT, INR in the last 72 hours.    Imaging Studies: Dg Chest 2 View  06/17/2014   CLINICAL DATA:  Initial evaluation for shortness of Breath. History of  hypertension, atrial fibrillation. Former smoker.  EXAM: CHEST  2 VIEW  COMPARISON:  Prior radiograph from 06/13/2014.  FINDINGS: Cardiomegaly is stable from prior study. Mediastinal silhouette within normal limits. Tortuosity the intrathoracic aorta noted. Right rib bowing of the trachea at the aortic knob is stable. Hiatal hernia again noted.  Lungs are mildly hypoinflated. Attenuation of pulmonary markings suggest underlying COPD. No pulmonary edema or pleural effusion. No focal infiltrates to suggest acute infectious pneumonitis. He C opacity overlying the peripheral left upper and mid lung favored to reflect summation of shadows as the patient is slightly rotated to the left on frontal projection.  Remotely healed left-sided rib fractures noted. Diffuse osteopenia present. Accentuation of the normal thoracic kyphosis with multilevel degenerative changes seen throughout the visualized spine.  IMPRESSION: 1. No active cardiopulmonary disease. 2. Emphysema. 3. Stable cardiomegaly without pulmonary edema.   Electronically Signed   By: Rise Mu M.D.   On: 06/17/2014 20:34   Dg Chest 2 View  06/13/2014   CLINICAL DATA:  Left anterior rib pain, vomiting. Reported history of esophageal stricture.  EXAM: CHEST  2 VIEW  COMPARISON:  06/02/2014  FINDINGS: The heart size and mediastinal contours are within normal limits. Both lungs are clear. The visualized skeletal structures are unremarkable. Lungs are hypoaerated with crowding of the bronchovascular markings. The aorta is unfolded and ectatic. Right humeral sclerotic focus  most likely represents a bone island. The patient is extremely kyphotic. Upper thoracic compression deformities are reidentified but not well assessed without dedicated views today. Moderate hiatal hernia.  IMPRESSION: Low lung volume exam without focal acute finding. Moderate hiatal hernia. Esophageal stricture or achalasia could appear similar.   Electronically Signed   By: Christiana PellantGretchen   Green M.D.   On: 06/13/2014 14:30   Dg Ribs Unilateral W/chest Left  06/02/2014   CLINICAL DATA:  Status post fall 3 weeks ago now with 3-4 days of left breast pain radiating to the back  EXAM: LEFT RIBS AND CHEST - 3+ VIEW  COMPARISON:  PA and lateral chest of May 08, 2014  FINDINGS: There is prominent thoracic kyphosis due to known wedge compressions of at least 1 mid to upper thoracic vertebral body. The aerated portion of the lungs are clear. The heart and mediastinal structures exhibit no acute abnormalities. There is no pleural effusion or pneumothorax. There are deformities of the posterior aspects of multiple upper left ribs unchanged from the previous study. There is a fracture of the anterior aspect of the left ninth rib. There is an old fracture of the posterior aspect of the right fifth rib.  IMPRESSION: There are multiple fractures of the posterior upper left ribs and right fifth rib which were visible on the previous study. These are of uncertain age. There is also a fracture of the anterior aspect of the left ninth rib which appears acute.   Electronically Signed   By: David  SwazilandJordan   On: 06/02/2014 13:42   Dg Sacrum/coccyx  06/02/2014   CLINICAL DATA:  Fall.  Initial encounter  EXAM: SACRUM AND COCCYX - 2+ VIEW  COMPARISON:  04/12/2012 lumbar spine radiography  FINDINGS: Frontal imaging is limited by the amount of overlapping stool. There is no evidence of fracture or diastasis. Grade 1 L5-S1 anterolisthesis, chronic based on previous imaging.  IMPRESSION: 1. No acute osseous findings. Note that stool and gas limit sensitivity in the frontal projection. 2. L5-S1 facet degeneration with chronic anterolisthesis.   Electronically Signed   By: Tiburcio PeaJonathan  Watts M.D.   On: 06/02/2014 13:41   Dg Chest Port 1 View  06/18/2014   CLINICAL DATA:  Shortness of breath and mid chest pain.  Dyspnea.  EXAM: PORTABLE CHEST - 1 VIEW  COMPARISON:  06/17/2014 and 06/13/2014  FINDINGS: There is tortuosity of  the thoracic aorta. Overall heart size is within normal limits. The lungs are clear. Multiple old rib fractures. Marked accentuation of the thoracic kyphosis. No effusions.  IMPRESSION: No acute abnormalities.   Electronically Signed   By: Geanie CooleyJim  Maxwell M.D.   On: 06/18/2014 11:05  [2 weeks]   Assessment: 80 year old female who presented with dyspnea. Shortness of breath occurred after having food stick in her esophagus several days prior. She developed coughing congestion/shortness of breath. She has been complaining of dysphagia chronically. Require multiple dilations in the past. The last one 1 year ago.. Chest x-ray was unremarkable. BNP 3200. Echocardiogram revealed LVEF of 60-65%. She also has chronic anemia which is stable. Mild elevation of her transaminases since 2014.   EGD yesterday by Dr. Jena Gaussourk revealed Schatzki ring, status post dilation. Cannot exclude coexisting underlying esophageal motility disorder. Small hiatal hernia.  Patient reports that she was able to swallow much better after the procedure. She tolerated her evening meal. She has not had breakfast yet this morning. She is "starving".  Plan: 1. Plans for outpatient follow-up regarding anemia, transaminitis, dysphagia. 2. Continue  PPI daily.   LOS: 3 days   Tana CoastLeslie Tamalyn Wadsworth  06/20/2014, 12:32 PM

## 2014-06-20 NOTE — Telephone Encounter (Signed)
Please arrange for hospital follow up of dysphagia, elevated LFTs, anemia, in 4 weeks.

## 2014-06-21 LAB — CBC WITH DIFFERENTIAL/PLATELET
BASOS ABS: 0 10*3/uL (ref 0.0–0.1)
BASOS PCT: 1 % (ref 0–1)
Eosinophils Absolute: 0.2 10*3/uL (ref 0.0–0.7)
Eosinophils Relative: 3 % (ref 0–5)
HEMATOCRIT: 28 % — AB (ref 36.0–46.0)
Hemoglobin: 9 g/dL — ABNORMAL LOW (ref 12.0–15.0)
Lymphocytes Relative: 21 % (ref 12–46)
Lymphs Abs: 1.3 10*3/uL (ref 0.7–4.0)
MCH: 29.6 pg (ref 26.0–34.0)
MCHC: 32.1 g/dL (ref 30.0–36.0)
MCV: 92.1 fL (ref 78.0–100.0)
Monocytes Absolute: 0.5 10*3/uL (ref 0.1–1.0)
Monocytes Relative: 9 % (ref 3–12)
NEUTROS ABS: 4.2 10*3/uL (ref 1.7–7.7)
NEUTROS PCT: 66 % (ref 43–77)
Platelets: 182 10*3/uL (ref 150–400)
RBC: 3.04 MIL/uL — ABNORMAL LOW (ref 3.87–5.11)
RDW: 15.2 % (ref 11.5–15.5)
WBC: 6.2 10*3/uL (ref 4.0–10.5)

## 2014-06-21 LAB — GLUCOSE, CAPILLARY
GLUCOSE-CAPILLARY: 124 mg/dL — AB (ref 70–99)
Glucose-Capillary: 100 mg/dL — ABNORMAL HIGH (ref 70–99)
Glucose-Capillary: 97 mg/dL (ref 70–99)

## 2014-06-21 LAB — COMPREHENSIVE METABOLIC PANEL
ALK PHOS: 68 U/L (ref 39–117)
ALT: 34 U/L (ref 0–35)
AST: 38 U/L — AB (ref 0–37)
Albumin: 3.2 g/dL — ABNORMAL LOW (ref 3.5–5.2)
Anion gap: 12 (ref 5–15)
BILIRUBIN TOTAL: 0.6 mg/dL (ref 0.3–1.2)
BUN: 19 mg/dL (ref 6–23)
CHLORIDE: 105 meq/L (ref 96–112)
CO2: 25 meq/L (ref 19–32)
CREATININE: 1.49 mg/dL — AB (ref 0.50–1.10)
Calcium: 8.9 mg/dL (ref 8.4–10.5)
GFR calc Af Amer: 37 mL/min — ABNORMAL LOW (ref >90)
GFR, EST NON AFRICAN AMERICAN: 32 mL/min — AB (ref >90)
Glucose, Bld: 103 mg/dL — ABNORMAL HIGH (ref 70–99)
Potassium: 4.1 mEq/L (ref 3.7–5.3)
Sodium: 142 mEq/L (ref 137–147)
Total Protein: 6.8 g/dL (ref 6.0–8.3)

## 2014-06-21 LAB — HEPATITIS B SURFACE ANTIGEN: Hepatitis B Surface Ag: NEGATIVE

## 2014-06-21 LAB — HEPATITIS C ANTIBODY: HCV Ab: NEGATIVE

## 2014-06-21 LAB — FERRITIN: Ferritin: 1898 ng/mL — ABNORMAL HIGH (ref 10–291)

## 2014-06-21 NOTE — Progress Notes (Signed)
06/21/14 1026 Patient c/o pain to left ankle, noted to have swelling to inner and outer left ankle. Stated "it started yesterday". Dr. Ardyth HarpsHernandez assessed on rounds, offered x-ray to evaluate. Patient and family requested to have follow-up with her physician, no x-ray prior to discharge. MD aware. Earnstine RegalAshley Siddarth Hsiung, RN

## 2014-06-21 NOTE — Discharge Summary (Signed)
Physician Discharge Summary  Gloria [name]:[id number] DOB: 1929/08/11 DOA: 06/17/2014  PCP: Kristian Covey, MD  Admit date: 06/17/2014 Discharge date: 06/21/2014  Time spent: 45 minutes  Recommendations for Outpatient Follow-up:  -Will be discharged home today. -Advised to follow up with PCP in 2 weeks.   Discharge Diagnoses:  Principal Problem:   Dyspnea Active Problems:   ESOPHAGEAL STRICTURE   ARF (acute renal failure)   Metabolic acidosis   Dysphagia, pharyngoesophageal phase   Esophageal stricture   Transaminitis   Anemia of chronic disease   Discharge Condition: Stable and improved  Filed Weights   06/17/14 1542 06/17/14 2318  Weight: 48.988 kg (108 lb) 47.9 kg (105 lb 9.6 oz)    History of present illness:  This is a 80 y.o. year old female with significant past medical history of esophageal stricture s/p multiple dilatations-most recent 2011, anemia, GERD presenting with dyspnea, AKI, hypoglyemia, chest pain. Pt noted to have had episode of ? Food stuck in her esophagus 3-4 days ago. Since this point, pt has had recurrent cough and SOB. Daughter reports pt w/ prior hx/o aspiration in the past. Denies any fevers or chills. Pt non smoker. Pt has only been drinking clears since incident. Denies any episodes of emesis. Pt reports persistent anterior chest pain. ? Worse with movement and deep breathing.  On presentation to the ER, T 97.6, HR 90s-100s, resp 10-20, BP 120s-170s, satting 96% on RA. WBC 5, hgb 10.5, Cr 1.7, BUN 53, bicarb 14, Glu 51. Pt given amp of d50 and started on D5 drip. CXR negative for any acute abnormality. + emphysema and stable cardiomegaly. Pro BNP 3200, trop neg x1. EKG NSR.   Hospital Course:   Dyspnea -Resolved -Etiology unclear, question related to esophageal stricture. -Chest x-ray without infiltrates, will discontinue antibiotics. -2-D echo: Left ventricle: The cavity size was normal. Wall thickness was normal. Systolic  function was normal. The estimated ejection fraction was in the range of 60% to 65%. Wall motion was normal; there were no regional wall motion abnormalities. Diastolic dysfunction, grade indeterminate.  Acute renal failure -Likely secondary to decreased intravascular volume. -Resolved on IV fluids.  High gap metabolic acidosis -Resolved. -Lactic acid normal at 1.2. -Suspect related to dehydration/starvation ketoacidosis.  History of esophageal stricture -Patient states her chest pain and acute dyspnea started about 3 days ago when she feels like food got stuck in her esophagus. -Had esophageal dilatation 11/19. -Improved, is tolerating foods that she is used to, some textures she always has a problem with. -Continue D-III diet.   Procedures:  EGD with dilatation   Consultations:  GI  Discharge Instructions  Discharge Instructions    Increase activity slowly    Complete by:  As directed             Medication List    STOP taking these medications        B-D 3CC LUER-LOK SYR 25GX1" 25G X 1" 3 ML Misc  Generic drug:  SYRINGE-NEEDLE (DISP) 3 ML      TAKE these medications        calcium-vitamin D 250-100 MG-UNIT per tablet  Take 1 tablet by mouth 2 (two) times daily.     clonazePAM 0.5 MG tablet  Commonly known as:  KLONOPIN  Take 1 tablet (0.5 mg total) by mouth at bedtime as needed for anxiety.     cyanocobalamin 1000 MCG/ML injection  Commonly known as:  (VITAMIN B-12)  Inject 1,000 mcg into the muscle  every 30 (thirty) days.     ferrous sulfate 325 (65 FE) MG tablet  Take 325 mg by mouth daily with breakfast.     pantoprazole 40 MG tablet  Commonly known as:  PROTONIX  Take 40 mg by mouth daily.     traMADol 50 MG tablet  Commonly known as:  ULTRAM  Take 1 tablet (50 mg total) by mouth every 6 (six) hours as needed.     vitamin C 500 MG tablet  Commonly known as:  ASCORBIC ACID  Take 500 mg by mouth daily.     Vitamin D 2000 UNITS Caps    Take 1 capsule by mouth daily.       Allergies  Allergen Reactions  . Propoxyphene N-Acetaminophen Other (See Comments)    REACTION: Hallucinations  . Caffeine     REACTION: Heart racing  . Dexlansoprazole     GI upset  . Hydromorphone Hcl Other (See Comments)    hallucination  . Ibuprofen Other (See Comments)    REACTION: Decreased hearing  . Morphine Other (See Comments)    REACTION: Hallucinations  . Propoxyphene Hcl Other (See Comments)    REACTION: Hallucinations  . Meperidine Hcl Palpitations    REACTION: Severe palpatations  . Penicillins Rash  . Sulfamethoxazole-Trimethoprim Rash    REACTION: Rash       Follow-up Information    Follow up with Kristian Covey, MD. Schedule an appointment as soon as possible for a visit in 2 weeks.   Specialty:  Family Medicine   Contact information:   [address] [place] [address] [phone]       Please follow up.   Why:  Follow-up with Dr. Lindie Spruce as discussed to have left ankle evaluated.        The results of significant diagnostics from this hospitalization (including imaging, microbiology, ancillary and laboratory) are listed below for reference.    Significant Diagnostic Studies: Dg Chest 2 View  06/17/2014   CLINICAL DATA:  Initial evaluation for shortness of Breath. History of hypertension, atrial fibrillation. Former smoker.  EXAM: CHEST  2 VIEW  COMPARISON:  Prior radiograph from 06/13/2014.  FINDINGS: Cardiomegaly is stable from prior study. Mediastinal silhouette within normal limits. Tortuosity the intrathoracic aorta noted. Right rib bowing of the trachea at the aortic knob is stable. Hiatal hernia again noted.  Lungs are mildly hypoinflated. Attenuation of pulmonary markings suggest underlying COPD. No pulmonary edema or pleural effusion. No focal infiltrates to suggest acute infectious pneumonitis. He C opacity overlying the peripheral left upper and mid lung favored to reflect summation of  shadows as the patient is slightly rotated to the left on frontal projection.  Remotely healed left-sided rib fractures noted. Diffuse osteopenia present. Accentuation of the normal thoracic kyphosis with multilevel degenerative changes seen throughout the visualized spine.  IMPRESSION: 1. No active cardiopulmonary disease. 2. Emphysema. 3. Stable cardiomegaly without pulmonary edema.   Electronically Signed   By: Rise Mu M.D.   On: 06/17/2014 20:34   Dg Chest 2 View  06/13/2014   CLINICAL DATA:  Left anterior rib pain, vomiting. Reported history of esophageal stricture.  EXAM: CHEST  2 VIEW  COMPARISON:  06/02/2014  FINDINGS: The heart size and mediastinal contours are within normal limits. Both lungs are clear. The visualized skeletal structures are unremarkable. Lungs are hypoaerated with crowding of the bronchovascular markings. The aorta is unfolded and ectatic. Right humeral sclerotic focus most likely represents a bone island. The patient is extremely kyphotic.  Upper thoracic compression deformities are reidentified but not well assessed without dedicated views today. Moderate hiatal hernia.  IMPRESSION: Low lung volume exam without focal acute finding. Moderate hiatal hernia. Esophageal stricture or achalasia could appear similar.   Electronically Signed   By: Christiana PellantGretchen  Green M.D.   On: 06/13/2014 14:30   Dg Ribs Unilateral W/chest Left  06/02/2014   CLINICAL DATA:  Status post fall 3 weeks ago now with 3-4 days of left breast pain radiating to the back  EXAM: LEFT RIBS AND CHEST - 3+ VIEW  COMPARISON:  PA and lateral chest of May 08, 2014  FINDINGS: There is prominent thoracic kyphosis due to known wedge compressions of at least 1 mid to upper thoracic vertebral body. The aerated portion of the lungs are clear. The heart and mediastinal structures exhibit no acute abnormalities. There is no pleural effusion or pneumothorax. There are deformities of the posterior aspects of multiple  upper left ribs unchanged from the previous study. There is a fracture of the anterior aspect of the left ninth rib. There is an old fracture of the posterior aspect of the right fifth rib.  IMPRESSION: There are multiple fractures of the posterior upper left ribs and right fifth rib which were visible on the previous study. These are of uncertain age. There is also a fracture of the anterior aspect of the left ninth rib which appears acute.   Electronically Signed   By: David  SwazilandJordan   On: 06/02/2014 13:42   Dg Sacrum/coccyx  06/02/2014   CLINICAL DATA:  Fall.  Initial encounter  EXAM: SACRUM AND COCCYX - 2+ VIEW  COMPARISON:  04/12/2012 lumbar spine radiography  FINDINGS: Frontal imaging is limited by the amount of overlapping stool. There is no evidence of fracture or diastasis. Grade 1 L5-S1 anterolisthesis, chronic based on previous imaging.  IMPRESSION: 1. No acute osseous findings. Note that stool and gas limit sensitivity in the frontal projection. 2. L5-S1 facet degeneration with chronic anterolisthesis.   Electronically Signed   By: Tiburcio PeaJonathan  Watts M.D.   On: 06/02/2014 13:41   Dg Chest Port 1 View  06/18/2014   CLINICAL DATA:  Shortness of breath and mid chest pain.  Dyspnea.  EXAM: PORTABLE CHEST - 1 VIEW  COMPARISON:  06/17/2014 and 06/13/2014  FINDINGS: There is tortuosity of the thoracic aorta. Overall heart size is within normal limits. The lungs are clear. Multiple old rib fractures. Marked accentuation of the thoracic kyphosis. No effusions.  IMPRESSION: No acute abnormalities.   Electronically Signed   By: Geanie CooleyJim  Maxwell M.D.   On: 06/18/2014 11:05    Microbiology: Recent Results (from the past 240 hour(s))  Culture, blood (routine x 2)     Status: None (Preliminary result)   Collection Time: 06/17/14 10:00 PM  Result Value Ref Range Status   Specimen Description BLOOD RIGHT ANTECUBITAL  Final   Special Requests BOTTLES DRAWN AEROBIC AND ANAEROBIC 8CC EACH  Final   Culture NO GROWTH  4 DAYS  Final   Report Status PENDING  Incomplete  Culture, blood (routine x 2)     Status: None (Preliminary result)   Collection Time: 06/17/14 10:00 PM  Result Value Ref Range Status   Specimen Description BLOOD LEFT ANTECUBITAL  Final   Special Requests BOTTLES DRAWN AEROBIC AND ANAEROBIC 10CC EACH  Final   Culture NO GROWTH 4 DAYS  Final   Report Status PENDING  Incomplete     Labs: Basic Metabolic Panel:  Recent Labs Lab 06/17/14  1943 06/18/14 0416 06/19/14 0530 06/20/14 0515 06/21/14 0619  NA 135* 140 145 145 142  K 5.1 4.6 3.8 3.5* 4.1  CL 95* 102 110 108 105  CO2 14* 20 23 24 25   GLUCOSE 51* 103* 92 65* 103*  BUN 53* 42* 17 11 19   CREATININE 1.71* 1.37* 0.86 0.91 1.49*  CALCIUM 9.4 9.1 8.5 8.6 8.9   Liver Function Tests:  Recent Labs Lab 06/17/14 1943 06/18/14 0416 06/19/14 0530 06/20/14 0515 06/21/14 0619  AST 66* 56* 48* 44* 38*  ALT 58* 49* 39* 36* 34  ALKPHOS 93 80 68 66 68  BILITOT 0.6 0.5 0.5 0.5 0.6  PROT 7.9 6.9 6.2 6.1 6.8  ALBUMIN 3.8 3.3* 2.9* 2.9* 3.2*   No results for input(s): LIPASE, AMYLASE in the last 168 hours. No results for input(s): AMMONIA in the last 168 hours. CBC:  Recent Labs Lab 06/17/14 1943 06/18/14 0416 06/19/14 0530 06/20/14 0515 06/21/14 0619  WBC 5.0 4.3 4.1 5.1 6.2  NEUTROABS 3.0 2.5 2.0 3.2 4.2  HGB 10.5* 10.0* 8.8* 9.3* 9.0*  HCT 32.2* 30.2* 26.5* 28.5* 28.0*  MCV 91.2 90.1 89.8 91.9 92.1  PLT 245 226 185 190 182   Cardiac Enzymes:  Recent Labs Lab 06/17/14 1943 06/17/14 2200 06/18/14 0416 06/18/14 0956  TROPONINI <0.30 <0.30 <0.30 <0.30   BNP: BNP (last 3 results)  Recent Labs  06/17/14 1943  PROBNP 3241.0*   CBG:  Recent Labs Lab 06/20/14 1643 06/20/14 2041 06/20/14 2353 06/21/14 0309 06/21/14 0537  GLUCAP 126* 140* 124* 100* 97       Signed:  HERNANDEZ ACOSTA,Gloria [name]  [facility] Pager: [phone] 06/21/2014, 12:41 PM

## 2014-06-21 NOTE — Progress Notes (Signed)
06/21/14 1215 Patient discharge instructions reviewed with patient per Meda CoffeeNicky Morton, RN. IV d/c'd prior to discharge. Pt left floor in stable condition via w/c accompanied by nursing staff. Earnstine RegalAshley Alga Southall, RN

## 2014-06-22 LAB — CULTURE, BLOOD (ROUTINE X 2)
CULTURE: NO GROWTH
Culture: NO GROWTH

## 2014-06-23 ENCOUNTER — Emergency Department (HOSPITAL_COMMUNITY): Payer: Medicare HMO

## 2014-06-23 ENCOUNTER — Encounter (HOSPITAL_COMMUNITY): Payer: Self-pay | Admitting: Emergency Medicine

## 2014-06-23 ENCOUNTER — Inpatient Hospital Stay (HOSPITAL_COMMUNITY)
Admission: EM | Admit: 2014-06-23 | Discharge: 2014-07-01 | DRG: 309 | Disposition: E | Payer: Medicare HMO | Attending: Internal Medicine | Admitting: Internal Medicine

## 2014-06-23 ENCOUNTER — Encounter: Payer: Self-pay | Admitting: Internal Medicine

## 2014-06-23 DIAGNOSIS — R519 Headache, unspecified: Secondary | ICD-10-CM

## 2014-06-23 DIAGNOSIS — R51 Headache: Secondary | ICD-10-CM

## 2014-06-23 DIAGNOSIS — M810 Age-related osteoporosis without current pathological fracture: Secondary | ICD-10-CM | POA: Diagnosis present

## 2014-06-23 DIAGNOSIS — Z8249 Family history of ischemic heart disease and other diseases of the circulatory system: Secondary | ICD-10-CM

## 2014-06-23 DIAGNOSIS — J441 Chronic obstructive pulmonary disease with (acute) exacerbation: Secondary | ICD-10-CM | POA: Diagnosis present

## 2014-06-23 DIAGNOSIS — I4891 Unspecified atrial fibrillation: Secondary | ICD-10-CM

## 2014-06-23 DIAGNOSIS — Z87891 Personal history of nicotine dependence: Secondary | ICD-10-CM

## 2014-06-23 DIAGNOSIS — I5032 Chronic diastolic (congestive) heart failure: Secondary | ICD-10-CM | POA: Diagnosis present

## 2014-06-23 DIAGNOSIS — I4892 Unspecified atrial flutter: Secondary | ICD-10-CM | POA: Diagnosis present

## 2014-06-23 DIAGNOSIS — M25512 Pain in left shoulder: Secondary | ICD-10-CM | POA: Diagnosis not present

## 2014-06-23 DIAGNOSIS — Z833 Family history of diabetes mellitus: Secondary | ICD-10-CM

## 2014-06-23 DIAGNOSIS — I248 Other forms of acute ischemic heart disease: Secondary | ICD-10-CM | POA: Diagnosis present

## 2014-06-23 DIAGNOSIS — E861 Hypovolemia: Secondary | ICD-10-CM | POA: Diagnosis present

## 2014-06-23 DIAGNOSIS — I10 Essential (primary) hypertension: Secondary | ICD-10-CM | POA: Diagnosis present

## 2014-06-23 DIAGNOSIS — K5900 Constipation, unspecified: Secondary | ICD-10-CM | POA: Diagnosis present

## 2014-06-23 DIAGNOSIS — K5901 Slow transit constipation: Secondary | ICD-10-CM

## 2014-06-23 DIAGNOSIS — Z90710 Acquired absence of both cervix and uterus: Secondary | ICD-10-CM

## 2014-06-23 DIAGNOSIS — R778 Other specified abnormalities of plasma proteins: Secondary | ICD-10-CM

## 2014-06-23 DIAGNOSIS — N179 Acute kidney failure, unspecified: Secondary | ICD-10-CM | POA: Diagnosis present

## 2014-06-23 DIAGNOSIS — K219 Gastro-esophageal reflux disease without esophagitis: Secondary | ICD-10-CM | POA: Diagnosis present

## 2014-06-23 DIAGNOSIS — R0600 Dyspnea, unspecified: Secondary | ICD-10-CM | POA: Diagnosis present

## 2014-06-23 DIAGNOSIS — I480 Paroxysmal atrial fibrillation: Secondary | ICD-10-CM | POA: Diagnosis present

## 2014-06-23 DIAGNOSIS — Z79899 Other long term (current) drug therapy: Secondary | ICD-10-CM

## 2014-06-23 DIAGNOSIS — Z806 Family history of leukemia: Secondary | ICD-10-CM

## 2014-06-23 DIAGNOSIS — K222 Esophageal obstruction: Secondary | ICD-10-CM

## 2014-06-23 DIAGNOSIS — Z66 Do not resuscitate: Secondary | ICD-10-CM | POA: Diagnosis present

## 2014-06-23 DIAGNOSIS — E538 Deficiency of other specified B group vitamins: Secondary | ICD-10-CM | POA: Diagnosis present

## 2014-06-23 DIAGNOSIS — R55 Syncope and collapse: Secondary | ICD-10-CM

## 2014-06-23 DIAGNOSIS — E860 Dehydration: Secondary | ICD-10-CM | POA: Diagnosis present

## 2014-06-23 DIAGNOSIS — Z79891 Long term (current) use of opiate analgesic: Secondary | ICD-10-CM | POA: Diagnosis not present

## 2014-06-23 DIAGNOSIS — R001 Bradycardia, unspecified: Secondary | ICD-10-CM | POA: Diagnosis not present

## 2014-06-23 DIAGNOSIS — D649 Anemia, unspecified: Secondary | ICD-10-CM | POA: Diagnosis present

## 2014-06-23 DIAGNOSIS — R0602 Shortness of breath: Secondary | ICD-10-CM

## 2014-06-23 DIAGNOSIS — R7989 Other specified abnormal findings of blood chemistry: Secondary | ICD-10-CM

## 2014-06-23 DIAGNOSIS — Z808 Family history of malignant neoplasm of other organs or systems: Secondary | ICD-10-CM | POA: Diagnosis not present

## 2014-06-23 DIAGNOSIS — M1990 Unspecified osteoarthritis, unspecified site: Secondary | ICD-10-CM | POA: Diagnosis present

## 2014-06-23 LAB — CBC WITH DIFFERENTIAL/PLATELET
BASOS PCT: 0 % (ref 0–1)
Basophils Absolute: 0 10*3/uL (ref 0.0–0.1)
EOS PCT: 1 % (ref 0–5)
Eosinophils Absolute: 0.1 10*3/uL (ref 0.0–0.7)
HEMATOCRIT: 29.2 % — AB (ref 36.0–46.0)
HEMOGLOBIN: 9.4 g/dL — AB (ref 12.0–15.0)
LYMPHS ABS: 0.7 10*3/uL (ref 0.7–4.0)
Lymphocytes Relative: 11 % — ABNORMAL LOW (ref 12–46)
MCH: 29.7 pg (ref 26.0–34.0)
MCHC: 32.2 g/dL (ref 30.0–36.0)
MCV: 92.4 fL (ref 78.0–100.0)
MONO ABS: 0.3 10*3/uL (ref 0.1–1.0)
MONOS PCT: 5 % (ref 3–12)
NEUTROS ABS: 5.5 10*3/uL (ref 1.7–7.7)
Neutrophils Relative %: 83 % — ABNORMAL HIGH (ref 43–77)
Platelets: 198 10*3/uL (ref 150–400)
RBC: 3.16 MIL/uL — AB (ref 3.87–5.11)
RDW: 15.4 % (ref 11.5–15.5)
WBC: 6.6 10*3/uL (ref 4.0–10.5)

## 2014-06-23 LAB — COMPREHENSIVE METABOLIC PANEL
ALT: 32 U/L (ref 0–35)
AST: 34 U/L (ref 0–37)
Albumin: 3.2 g/dL — ABNORMAL LOW (ref 3.5–5.2)
Alkaline Phosphatase: 69 U/L (ref 39–117)
Anion gap: 15 (ref 5–15)
BILIRUBIN TOTAL: 0.3 mg/dL (ref 0.3–1.2)
BUN: 52 mg/dL — ABNORMAL HIGH (ref 6–23)
CO2: 22 mEq/L (ref 19–32)
Calcium: 9.1 mg/dL (ref 8.4–10.5)
Chloride: 98 mEq/L (ref 96–112)
Creatinine, Ser: 2.33 mg/dL — ABNORMAL HIGH (ref 0.50–1.10)
GFR calc Af Amer: 21 mL/min — ABNORMAL LOW (ref >90)
GFR calc non Af Amer: 18 mL/min — ABNORMAL LOW (ref >90)
Glucose, Bld: 167 mg/dL — ABNORMAL HIGH (ref 70–99)
Potassium: 4.1 mEq/L (ref 3.7–5.3)
Sodium: 135 mEq/L — ABNORMAL LOW (ref 137–147)
TOTAL PROTEIN: 7.3 g/dL (ref 6.0–8.3)

## 2014-06-23 LAB — TROPONIN I: Troponin I: 0.62 ng/mL (ref <0.30)

## 2014-06-23 LAB — PROTIME-INR
INR: 1.23 (ref 0.00–1.49)
Prothrombin Time: 15.6 seconds — ABNORMAL HIGH (ref 11.6–15.2)

## 2014-06-23 LAB — GLUCOSE, CAPILLARY
Glucose-Capillary: 98 mg/dL (ref 70–99)
Glucose-Capillary: 106 mg/dL — ABNORMAL HIGH (ref 70–99)

## 2014-06-23 LAB — PRO B NATRIURETIC PEPTIDE: PRO B NATRI PEPTIDE: 14039 pg/mL — AB (ref 0–450)

## 2014-06-23 MED ORDER — DILTIAZEM HCL 100 MG IV SOLR
5.0000 mg/h | INTRAVENOUS | Status: DC
  Administered 2014-06-23 – 2014-06-24 (×2): 5 mg/h via INTRAVENOUS
  Filled 2014-06-23: qty 100

## 2014-06-23 MED ORDER — ASPIRIN 81 MG PO CHEW
324.0000 mg | CHEWABLE_TABLET | Freq: Once | ORAL | Status: AC
  Administered 2014-06-23: 324 mg via ORAL
  Filled 2014-06-23: qty 4

## 2014-06-23 MED ORDER — DILTIAZEM LOAD VIA INFUSION
10.0000 mg | Freq: Once | INTRAVENOUS | Status: AC
  Administered 2014-06-23: 10 mg via INTRAVENOUS
  Filled 2014-06-23: qty 10

## 2014-06-23 NOTE — ED Provider Notes (Signed)
CSN: [id number]     Arrival date & time 07-08-14  1933 History   First MD Initiated Contact with Patient 07-08-14 1942     Chief Complaint  Patient presents with  . Loss of Consciousness     (Consider location/radiation/quality/duration/timing/severity/associated sxs/prior Treatment) HPI Comments: Patient from home after apparent syncopal episode. She was standing at the  sink brushing her teeth when her family noticed her head fall to the side and she became unresponsive. Her family lowered her to the floor started performing chest compressions and rescue breathing. They did not check for a pulse. Patient apparently became alert after 2 minutes. She is now awake and denies any complaint. She does not know what happened. She denies any chest pain or shortness of breath. She was discharged from the hospital 2 days ago after evaluation for dyspnea. EMS found her to be in rapid atrial fibrillation which she does have a history of. She is not anticoagulated. No abdominal pain, nausea or vomiting.   The history is provided by the patient and the EMS personnel.    Past Medical History  Diagnosis Date  . Allergy   . B12 deficiency   . Hypertension   . Atrial fibrillation 2011  . GERD (gastroesophageal reflux disease)   . Osteoporosis   . Recurrent UTI   . Esophageal stricture   . Arthritis   . Diverticulosis   . Normocytic anemia   . Chronic epigastric pain     EM Dr. Jarold Motto 2006, low LES pressure, high Demeester, unclear if pt on suppression  . Lumbar stenosis   . Chronic anemia     without evidence of iron deficiency in 2005/2008  . Degenerative disc disease    Past Surgical History  Procedure Laterality Date  . Tonsillectomy    . Abdominal hysterectomy  1969    partial, no cancer  . Appendectomy    . Cholecystectomy  1999  . Esophagogastroduodenoscopy  03/19/10    noncritical appearing Schatzki's ring/small hiatal hernia, adenomatous-appearing mucosa-bx benign  .  Esophagogastroduodenoscopy  06/04/09    Dr Cherylynn Ridges  . Colonoscopy  2005    reported Dr Doretha Imus  . Esophagogastroduodenoscopy (egd) with esophageal dilation N/A 06/03/2013    Procedure: ESOPHAGOGASTRODUODENOSCOPY (EGD) WITH ESOPHAGEAL DILATION;  Surgeon: Corbin Ade, MD;  Location: [facility];  Service: Endoscopy;  Laterality: N/A;  2:30  . Esophagogastroduodenoscopy N/A 06/19/2014    Procedure: ESOPHAGOGASTRODUODENOSCOPY (EGD) ED;  Surgeon: Corbin Ade, MD;  Location: [facility];  Service: Endoscopy;  Laterality: N/A;   Family History  Problem Relation Age of Onset  . Hypertension Mother   . Hypertension Father   . Leukemia Father   . Diabetes Brother   . Cancer Brother     melanoma,bone, metastatic unk origin   History  Substance Use Topics  . Smoking status: Former Smoker -- 0.30 packs/day for 20 years    Types: Cigarettes    Quit date: 09/22/2010  . Smokeless tobacco: Not on file  . Alcohol Use: No   OB History    No data available     Review of Systems  Constitutional: Negative for fever, activity change and appetite change.  HENT: Negative for congestion and rhinorrhea.   Respiratory: Negative for cough, chest tightness and shortness of breath.   Cardiovascular: Positive for syncope. Negative for chest pain.  Gastrointestinal: Negative for nausea, vomiting and abdominal pain.  Genitourinary: Negative for dysuria, hematuria, vaginal bleeding and vaginal discharge.  Musculoskeletal: Negative for myalgias, back  pain and arthralgias.  Skin: Negative for rash.  Neurological: Positive for syncope. Negative for dizziness, weakness, light-headedness and headaches.  A complete 10 system review of systems was obtained and all systems are negative except as noted in the HPI and PMH.      Allergies  Propoxyphene n-acetaminophen; Caffeine; Dexlansoprazole; Hydromorphone hcl; Ibuprofen; Morphine; Propoxyphene hcl; Meperidine hcl; Penicillins;  and Sulfamethoxazole-trimethoprim  Home Medications   Prior to Admission medications   Medication Sig Start Date End Date Taking? Authorizing Provider  calcium-vitamin D 250-100 MG-UNIT per tablet Take 1 tablet by mouth 2 (two) times daily.     Yes Historical Provider, MD  Cholecalciferol (VITAMIN D) 2000 UNITS CAPS Take 1 capsule by mouth daily.   Yes Historical Provider, MD  clonazePAM (KLONOPIN) 0.5 MG tablet Take 1 tablet (0.5 mg total) by mouth at bedtime as needed for anxiety. Patient taking differently: Take 0.125 mg by mouth at bedtime as needed for anxiety.  12/27/13  Yes Kristian Covey, MD  cyanocobalamin (,VITAMIN B-12,) 1000 MCG/ML injection Inject 1,000 mcg into the muscle every 30 (thirty) days.   Yes Historical Provider, MD  docusate sodium (COLACE) 100 MG capsule Take 100 mg by mouth daily.   Yes Historical Provider, MD  ferrous sulfate 325 (65 FE) MG tablet Take 325 mg by mouth daily with breakfast.     Yes Historical Provider, MD  pantoprazole (PROTONIX) 40 MG tablet Take 40 mg by mouth daily.   Yes Historical Provider, MD  traMADol (ULTRAM) 50 MG tablet Take 1 tablet (50 mg total) by mouth every 6 (six) hours as needed. Patient taking differently: Take 100 mg by mouth 2 (two) times daily.  06/02/14  Yes Donnetta Hutching, MD  vitamin C (ASCORBIC ACID) 500 MG tablet Take 500 mg by mouth daily.     Yes Historical Provider, MD   BP 118/79 mmHg  Pulse 121  Temp(Src) 97.5 F (36.4 C) (Oral)  Resp 14  Ht 5' (1.524 m)  Wt 106 lb (48.081 kg)  BMI 20.70 kg/m2  SpO2 97% Physical Exam  Constitutional: She is oriented to person, place, and time. She appears well-developed and well-nourished. No distress.  HENT:  Head: Normocephalic and atraumatic.  Mouth/Throat: Oropharynx is clear and moist. No oropharyngeal exudate.  Dry mucus membranes  Eyes: Conjunctivae and EOM are normal. Pupils are equal, round, and reactive to light.  Neck: Normal range of motion. Neck supple.  NO C spine  tenderness  Cardiovascular: Normal rate, normal heart sounds and intact distal pulses.   No murmur heard. Irregular rhythm  Pulmonary/Chest: Effort normal and breath sounds normal. No respiratory distress. She exhibits no tenderness.  Abdominal: Soft. There is no tenderness. There is no rebound and no guarding.  Musculoskeletal: Normal range of motion. She exhibits no edema or tenderness.  Neurological: She is alert and oriented to person, place, and time. No cranial nerve deficit. She exhibits normal muscle tone. Coordination normal.  No ataxia on finger to nose bilaterally. No pronator drift. 5/5 strength throughout. CN 2-12 intact. Negative Romberg. Equal grip strength. Sensation intact. Gait is normal.   Skin: Skin is warm.  Psychiatric: She has a normal mood and affect. Her behavior is normal.  Nursing note and vitals reviewed.   ED Course  Procedures (including critical care time) Labs Review Labs Reviewed  CBC WITH DIFFERENTIAL - Abnormal; Notable for the following:    RBC 3.16 (*)    Hemoglobin 9.4 (*)    HCT 29.2 (*)  Neutrophils Relative % 83 (*)    Lymphocytes Relative 11 (*)    All other components within normal limits  COMPREHENSIVE METABOLIC PANEL - Abnormal; Notable for the following:    Sodium 135 (*)    Glucose, Bld 167 (*)    BUN 52 (*)    Creatinine, Ser 2.33 (*)    Albumin 3.2 (*)    GFR calc non Af Amer 18 (*)    GFR calc Af Amer 21 (*)    All other components within normal limits  TROPONIN I - Abnormal; Notable for the following:    Troponin I 0.62 (*)    All other components within normal limits  PROTIME-INR - Abnormal; Notable for the following:    Prothrombin Time 15.6 (*)    All other components within normal limits  PRO B NATRIURETIC PEPTIDE - Abnormal; Notable for the following:    Pro B Natriuretic peptide (BNP) 14039.0 (*)    All other components within normal limits  URINALYSIS, ROUTINE W REFLEX MICROSCOPIC    Imaging Review Ct Head Wo  Contrast  06/11/2014   CLINICAL DATA:  Syncope.  EXAM: CT HEAD WITHOUT CONTRAST  TECHNIQUE: Contiguous axial images were obtained from the base of the skull through the vertex without intravenous contrast.  COMPARISON:  None.  FINDINGS: Patchy low attenuation within the subcortical and periventricular white matter noted. There is no evidence for acute intracranial infarct, hemorrhage or mass. The paranasal sinuses and mastoid air cells appear clear. The calvarium is intact.  IMPRESSION: 1. No acute intracranial abnormalities identified. 2. Chronic small vessel ischemic disease.   Electronically Signed   By: Signa Kell M.D.   On: 06/20/2014 22:28   Dg Chest Portable 1 View  06/26/2014   CLINICAL DATA:  Syncope.  EXAM: PORTABLE CHEST - 1 VIEW  COMPARISON:  06/28/2014 and 06/17/2014  FINDINGS: Heart size and pulmonary vascularity are normal. Tortuosity and calcification of the thoracic aorta. Lungs are clear. No acute osseous abnormality. Old right posterior rib fracture.  IMPRESSION: No acute abnormalities.   Electronically Signed   By: Geanie Cooley M.D.   On: 06/09/2014 20:34     EKG Interpretation None      MDM   Final diagnoses:  Syncope  Atrial fibrillation with RVR  Elevated troponin   Syncopal episode while standing at the sink. Family performed CPR but doubt patient actually lost pulses. She is awake and alert now. Rapid A. fib denies chest pain or shortness of breath.  Hemoglobin stable. Creatinine 2.3 from 1.4. Troponin 0.6. Patient denies any chest pain or shortness of breath.  Patient started on IV Cardizem drip for rapid atrial fibrillation. Chest x-ray is negative though BNP is 14,000. Recent echo on 11/18 with normal EF.  Gentle IV hydration started.  CT head negative.  CXR clear. No chest pain or SOB.  Likely demand ischemia from Afib or chest compressions.  Dw Dr. Shirlee Latch of cardiology who agrees and feels patient can stay at [facility].  Agrees with heparin gtt as CT head  negative.  D/w Dr. Sharl Ma.   CRITICAL CARE Performed by: Glynn Octave Total critical care time: 35 Critical care time was exclusive of separately billable procedures and treating other patients. Critical care was necessary to treat or prevent imminent or life-threatening deterioration. Critical care was time spent personally by me on the following activities: development of treatment plan with patient and/or surrogate as well as nursing, discussions with consultants, evaluation of patient's response to treatment, examination of  patient, obtaining history from patient or surrogate, ordering and performing treatments and interventions, ordering and review of laboratory studies, ordering and review of radiographic studies, pulse oximetry and re-evaluation of patient's condition.    Date: 06/08/2014  Rate: 144  Rhythm: atrial fibrillation  QRS Axis: normal  Intervals: normal  ST/T Wave abnormalities: nonspecific ST/T changes  Conduction Disutrbances:none  Narrative Interpretation:   Old EKG Reviewed: changes noted    I personally performed the services described in this documentation, which was scribed in my presence. The recorded information has been reviewed and is accurate.  Glynn Octave, MD 06/15/2014 [phone]

## 2014-06-23 NOTE — Progress Notes (Signed)
UR chart review completed.  

## 2014-06-23 NOTE — H&P (Addendum)
PCP:   Kristian Covey, MD   Chief Complaint:  Passed out  HPI: 80 yr old female who    has a past medical history of Allergy; B12 deficiency; Hypertension; Atrial fibrillation (2011); GERD (gastroesophageal reflux disease); Osteoporosis; Recurrent UTI; Esophageal stricture; Arthritis; Diverticulosis; Normocytic anemia; Chronic epigastric pain; Lumbar stenosis; Chronic anemia; and Degenerative disc disease. Patient was recently discharged from the hospital on 06/21/2014 after she was admitted for dyspnea, esophagus structure, acute kidney injury. As per family patient has not been eating and drinking well after she was discharged from the hospital and today when she went to bathroom to brush her teeth she became unresponsive. Patient's family member lowered her to the floor and started performing chest compressions and rescue breathing. They did not check the pulse patient became alert after a minute. EMS was called and patient was found to be in rapid A. Fib. She denies chest pain, admits to having shortness of breath. In the ED patient was found to have elevated troponin 0.62, significant elevation of BNP 14039. Chest x-ray does not show pulmonary edema. Cardiology was consulted by the ED physician who recommended heparin infusion. Patient denies nausea vomiting or diarrhea. No fever dysuria urgency or frequency of urination.  Allergies:   Allergies  Allergen Reactions  . Propoxyphene N-Acetaminophen Other (See Comments)    REACTION: Hallucinations  . Caffeine     REACTION: Heart racing  . Dexlansoprazole     GI upset  . Hydromorphone Hcl Other (See Comments)    hallucination  . Ibuprofen Other (See Comments)    REACTION: Decreased hearing  . Morphine Other (See Comments)    REACTION: Hallucinations  . Propoxyphene Hcl Other (See Comments)    REACTION: Hallucinations  . Meperidine Hcl Palpitations    REACTION: Severe palpatations  . Penicillins Rash  .  Sulfamethoxazole-Trimethoprim Rash    REACTION: Rash      Past Medical History  Diagnosis Date  . Allergy   . B12 deficiency   . Hypertension   . Atrial fibrillation 2011  . GERD (gastroesophageal reflux disease)   . Osteoporosis   . Recurrent UTI   . Esophageal stricture   . Arthritis   . Diverticulosis   . Normocytic anemia   . Chronic epigastric pain     EM Dr. Jarold Motto 2006, low LES pressure, high Demeester, unclear if pt on suppression  . Lumbar stenosis   . Chronic anemia     without evidence of iron deficiency in 2005/2008  . Degenerative disc disease     Past Surgical History  Procedure Laterality Date  . Tonsillectomy    . Abdominal hysterectomy  1969    partial, no cancer  . Appendectomy    . Cholecystectomy  1999  . Esophagogastroduodenoscopy  03/19/10    noncritical appearing Schatzki's ring/small hiatal hernia, adenomatous-appearing mucosa-bx benign  . Esophagogastroduodenoscopy  06/04/09    Dr Cherylynn Ridges  . Colonoscopy  2005    reported Dr Doretha Imus  . Esophagogastroduodenoscopy (egd) with esophageal dilation N/A 06/03/2013    Procedure: ESOPHAGOGASTRODUODENOSCOPY (EGD) WITH ESOPHAGEAL DILATION;  Surgeon: Corbin Ade, MD;  Location: [facility];  Service: Endoscopy;  Laterality: N/A;  2:30  . Esophagogastroduodenoscopy N/A 06/19/2014    Procedure: ESOPHAGOGASTRODUODENOSCOPY (EGD) ED;  Surgeon: Corbin Ade, MD;  Location: [facility];  Service: Endoscopy;  Laterality: N/A;    Prior to Admission medications   Medication Sig Start Date End Date Taking? Authorizing Provider  calcium-vitamin D 250-100 MG-UNIT per tablet  Take 1 tablet by mouth 2 (two) times daily.     Yes Historical Provider, MD  Cholecalciferol (VITAMIN D) 2000 UNITS CAPS Take 1 capsule by mouth daily.   Yes Historical Provider, MD  clonazePAM (KLONOPIN) 0.5 MG tablet Take 1 tablet (0.5 mg total) by mouth at bedtime as needed for anxiety. Patient taking  differently: Take 0.125 mg by mouth at bedtime as needed for anxiety.  12/27/13  Yes Kristian Covey, MD  cyanocobalamin (,VITAMIN B-12,) 1000 MCG/ML injection Inject 1,000 mcg into the muscle every 30 (thirty) days.   Yes Historical Provider, MD  docusate sodium (COLACE) 100 MG capsule Take 100 mg by mouth daily.   Yes Historical Provider, MD  ferrous sulfate 325 (65 FE) MG tablet Take 325 mg by mouth daily with breakfast.     Yes Historical Provider, MD  pantoprazole (PROTONIX) 40 MG tablet Take 40 mg by mouth daily.   Yes Historical Provider, MD  traMADol (ULTRAM) 50 MG tablet Take 1 tablet (50 mg total) by mouth every 6 (six) hours as needed. Patient taking differently: Take 100 mg by mouth 2 (two) times daily.  06/02/14  Yes Donnetta Hutching, MD  vitamin C (ASCORBIC ACID) 500 MG tablet Take 500 mg by mouth daily.     Yes Historical Provider, MD  HYDROcodone-acetaminophen (NORCO/VICODIN) 5-325 MG per tablet Take 1 tablet by mouth.  06/12/14   Historical Provider, MD    Social History:  reports that she quit smoking about 3 years ago. Her smoking use included Cigarettes. She has a 6 pack-year smoking history. She does not have any smokeless tobacco history on file. She reports that she does not drink alcohol or use illicit drugs.  Family History  Problem Relation Age of Onset  . Hypertension Mother   . Hypertension Father   . Leukemia Father   . Diabetes Brother   . Cancer Brother     melanoma,bone, metastatic unk origin     All the positives are listed in BOLD  Review of Systems:  HEENT: Headache, blurred vision, runny nose, sore throat Neck: Hypothyroidism, hyperthyroidism,,lymphadenopathy Chest : Shortness of breath, history of COPD, Asthma Heart : Chest pain, history of coronary arterey disease GI:  Nausea, vomiting, diarrhea, constipation, GERD GU: Dysuria, urgency, frequency of urination, hematuria Neuro: Stroke, seizures, syncope Psych: Depression, anxiety,  hallucinations   Physical Exam: Blood pressure 121/84, pulse 125, temperature 97.5 F (36.4 C), temperature source Oral, resp. rate 25, height 5' (1.524 m), weight 48.081 kg (106 lb), SpO2 99 %. Constitutional:   Patient is a well-developed and well-nourished female* in no acute distress and cooperative with exam. Head: Normocephalic and atraumatic Mouth: Mucus membranes moist Eyes: PERRL, EOMI, conjunctivae normal Neck: Supple, No Thyromegaly Cardiovascular: Irregularly irregular rhythm, no murmurs auscultated S1 normal, S2 normal Pulmonary/Chest: CTAB, no wheezes, rales, or rhonchi Abdominal: Soft. Non-tender, non-distended, bowel sounds are normal, no masses, organomegaly, or guarding present.  Neurological: A&O x3, Strength is normal and symmetric bilaterally, cranial nerve II-XII are grossly intact, no focal motor deficit, sensory intact to light touch bilaterally.  Extremities : No Cyanosis, Clubbing or Edema  Labs on Admission:  Basic Metabolic Panel:  Recent Labs Lab 06/18/14 0416 06/19/14 0530 06/20/14 0515 06/21/14 0619 Jul 17, 2014 1954  NA 140 145 145 142 135*  K 4.6 3.8 3.5* 4.1 4.1  CL 102 110 108 105 98  CO2 20 23 24 25 22   GLUCOSE 103* 92 65* 103* 167*  BUN 42* 17 11 19  52*  CREATININE  1.37* 0.86 0.91 1.49* 2.33*  CALCIUM 9.1 8.5 8.6 8.9 9.1   Liver Function Tests:  Recent Labs Lab 06/18/14 0416 06/19/14 0530 06/20/14 0515 06/21/14 0619 06/11/2014 1954  AST 56* 48* 44* 38* 34  ALT 49* 39* 36* 34 32  ALKPHOS 80 68 66 68 69  BILITOT 0.5 0.5 0.5 0.6 0.3  PROT 6.9 6.2 6.1 6.8 7.3  ALBUMIN 3.3* 2.9* 2.9* 3.2* 3.2*   No results for input(s): LIPASE, AMYLASE in the last 168 hours. No results for input(s): AMMONIA in the last 168 hours. CBC:  Recent Labs Lab 06/18/14 0416 06/19/14 0530 06/20/14 0515 06/21/14 0619 Jun 29, 2014 1954  WBC 4.3 4.1 5.1 6.2 6.6  NEUTROABS 2.5 2.0 3.2 4.2 5.5  HGB 10.0* 8.8* 9.3* 9.0* 9.4*  HCT 30.2* 26.5* 28.5* 28.0* 29.2*   MCV 90.1 89.8 91.9 92.1 92.4  PLT 226 185 190 182 198   Cardiac Enzymes:  Recent Labs Lab 06/17/14 1943 06/17/14 2200 06/18/14 0416 06/18/14 0956 06/13/2014 1954  TROPONINI <0.30 <0.30 <0.30 <0.30 0.62*    BNP (last 3 results)  Recent Labs  06/17/14 1943 06/04/2014 1954  PROBNP 3241.0* 14039.0*   CBG:  Recent Labs Lab 06/20/14 2353 06/21/14 0309 06/21/14 0537 06/21/14 0806 06/21/14 1028  GLUCAP 124* 100* 97 98 106*    Radiological Exams on Admission: Ct Head Wo Contrast  06/08/2014   CLINICAL DATA:  Syncope.  EXAM: CT HEAD WITHOUT CONTRAST  TECHNIQUE: Contiguous axial images were obtained from the base of the skull through the vertex without intravenous contrast.  COMPARISON:  None.  FINDINGS: Patchy low attenuation within the subcortical and periventricular white matter noted. There is no evidence for acute intracranial infarct, hemorrhage or mass. The paranasal sinuses and mastoid air cells appear clear. The calvarium is intact.  IMPRESSION: 1. No acute intracranial abnormalities identified. 2. Chronic small vessel ischemic disease.   Electronically Signed   By: Signa Kell M.D.   On: 06/16/2014 22:28   Dg Chest Portable 1 View  06/15/2014   CLINICAL DATA:  Syncope.  EXAM: PORTABLE CHEST - 1 VIEW  COMPARISON:  06/28/2014 and 06/17/2014  FINDINGS: Heart size and pulmonary vascularity are normal. Tortuosity and calcification of the thoracic aorta. Lungs are clear. No acute osseous abnormality. Old right posterior rib fracture.  IMPRESSION: No acute abnormalities.   Electronically Signed   By: Geanie Cooley M.D.   On: 06/22/2014 20:34    EKG: Independently reviewed. Atrial fibrillation with rapid rate   Assessment/Plan Active Problems:   Essential hypertension   ESOPHAGEAL STRICTURE   Constipation   Dyspnea   ARF (acute renal failure)   Atrial fibrillation with RVR  Atrial fibrillation with rapid ventricle response Patient has been started on Cardizem drip in  the ED, will continue with IV Cardizem. Also initiate anticoagulation with IV heparin. Will consult cardiology in a.m.  Elevated troponin Patient has mildly elevated troponin which could be due to demand ischemia from the A. fib. Will cycle the cardiac enzymes, heparin drip has been ordered. Patient is not having any chest pain, aspirin has been ordered.  Acute kidney injury on C KD Patient has worsening of BUN/creatinine today 52/2.33, previous creatinine was 1.49 on 06/21/2014. Patient has not been eating and drinking per family, will start gentle IV hydration with normal saline at 50 mL per hour and follow BMP in a.m.  Elevated BNP,? Diastolic heart failure/ dyspnea Patient had an echocardiogram done on 06/18/2014 which showed EF 60-65% with indeterminate diastolic dysfunction. Patient  surprisingly has normal chest x-ray with clear auscultation of lungs. Will closely monitor the patient for fluid overload as she has been started on gentle IV fluids. And consult cardiology in a.m for further evaluation and recommendations.  History of esophageal stricture Stable at this time no dysphagia. Continue by mouth Protonix.    Code status: Patient is DO NOT RESUSCITATE  Family discussion: Admission, patients condition and plan of care including tests being ordered have been discussed with the patient and *her daughter at bedside who indicate understanding and agree with the plan and Code Status.   Time Spent on Admission: 75 minutes  Asuncion Shibata S [facility] Pager: [phone] 06/30/14, 10:43 PM  If 7PM-7AM, please contact night-coverage  [email or web address]  Password [email or web address]

## 2014-06-23 NOTE — H&P (Signed)
error 

## 2014-06-23 NOTE — ED Notes (Signed)
Pt has syncopal episode while brushing teeth. When ems arrived pts heart rate was 170 and afib. Pt does not have any prior history of afib.

## 2014-06-23 NOTE — Telephone Encounter (Signed)
APPT MADE AND LETTER SENT  °

## 2014-06-23 NOTE — ED Notes (Signed)
pts blood sugar was 154 enroute.

## 2014-06-24 ENCOUNTER — Encounter (HOSPITAL_COMMUNITY): Payer: Self-pay | Admitting: *Deleted

## 2014-06-24 DIAGNOSIS — R7989 Other specified abnormal findings of blood chemistry: Secondary | ICD-10-CM

## 2014-06-24 DIAGNOSIS — R778 Other specified abnormalities of plasma proteins: Secondary | ICD-10-CM

## 2014-06-24 DIAGNOSIS — I369 Nonrheumatic tricuspid valve disorder, unspecified: Secondary | ICD-10-CM

## 2014-06-24 LAB — URINALYSIS, ROUTINE W REFLEX MICROSCOPIC
Bilirubin Urine: NEGATIVE
GLUCOSE, UA: NEGATIVE mg/dL
Ketones, ur: NEGATIVE mg/dL
Nitrite: NEGATIVE
Specific Gravity, Urine: 1.025 (ref 1.005–1.030)
Urobilinogen, UA: 0.2 mg/dL (ref 0.0–1.0)
pH: 5 (ref 5.0–8.0)

## 2014-06-24 LAB — COMPREHENSIVE METABOLIC PANEL
ALBUMIN: 2.6 g/dL — AB (ref 3.5–5.2)
ALK PHOS: 57 U/L (ref 39–117)
ALT: 25 U/L (ref 0–35)
AST: 28 U/L (ref 0–37)
Anion gap: 11 (ref 5–15)
BUN: 48 mg/dL — ABNORMAL HIGH (ref 6–23)
CO2: 24 mEq/L (ref 19–32)
Calcium: 8.7 mg/dL (ref 8.4–10.5)
Chloride: 100 mEq/L (ref 96–112)
Creatinine, Ser: 1.82 mg/dL — ABNORMAL HIGH (ref 0.50–1.10)
GFR calc Af Amer: 29 mL/min — ABNORMAL LOW (ref >90)
GFR calc non Af Amer: 25 mL/min — ABNORMAL LOW (ref >90)
Glucose, Bld: 106 mg/dL — ABNORMAL HIGH (ref 70–99)
POTASSIUM: 4.1 meq/L (ref 3.7–5.3)
SODIUM: 135 meq/L — AB (ref 137–147)
Total Bilirubin: 0.3 mg/dL (ref 0.3–1.2)
Total Protein: 6.1 g/dL (ref 6.0–8.3)

## 2014-06-24 LAB — MRSA PCR SCREENING: MRSA by PCR: INVALID — AB

## 2014-06-24 LAB — URINE MICROSCOPIC-ADD ON

## 2014-06-24 LAB — TROPONIN I
TROPONIN I: 0.87 ng/mL — AB (ref <0.30)
Troponin I: 1 ng/mL (ref <0.30)
Troponin I: 0.95 ng/mL (ref <0.30)

## 2014-06-24 LAB — CBC
HCT: 25.3 % — ABNORMAL LOW (ref 36.0–46.0)
HEMOGLOBIN: 8.2 g/dL — AB (ref 12.0–15.0)
MCH: 29.7 pg (ref 26.0–34.0)
MCHC: 32.4 g/dL (ref 30.0–36.0)
MCV: 91.7 fL (ref 78.0–100.0)
PLATELETS: 173 10*3/uL (ref 150–400)
RBC: 2.76 MIL/uL — ABNORMAL LOW (ref 3.87–5.11)
RDW: 15.3 % (ref 11.5–15.5)
WBC: 6 10*3/uL (ref 4.0–10.5)

## 2014-06-24 LAB — HEPARIN LEVEL (UNFRACTIONATED): Heparin Unfractionated: 0.35 IU/mL (ref 0.30–0.70)

## 2014-06-24 LAB — MAGNESIUM: Magnesium: 1.5 mg/dL (ref 1.5–2.5)

## 2014-06-24 LAB — TSH: TSH: 2.18 u[IU]/mL (ref 0.350–4.500)

## 2014-06-24 MED ORDER — METOPROLOL TARTRATE 1 MG/ML IV SOLN
5.0000 mg | Freq: Once | INTRAVENOUS | Status: AC
  Administered 2014-06-24: 5 mg via INTRAVENOUS
  Filled 2014-06-24: qty 5

## 2014-06-24 MED ORDER — METOPROLOL TARTRATE 25 MG PO TABS
25.0000 mg | ORAL_TABLET | Freq: Four times a day (QID) | ORAL | Status: DC
  Administered 2014-06-24 – 2014-06-25 (×4): 25 mg via ORAL
  Filled 2014-06-24 (×4): qty 1

## 2014-06-24 MED ORDER — TRAMADOL HCL 50 MG PO TABS
50.0000 mg | ORAL_TABLET | Freq: Four times a day (QID) | ORAL | Status: DC | PRN
  Administered 2014-06-24 – 2014-06-26 (×6): 50 mg via ORAL
  Filled 2014-06-24 (×6): qty 1

## 2014-06-24 MED ORDER — ONDANSETRON HCL 4 MG PO TABS: 4.0000 mg | ORAL_TABLET | Freq: Four times a day (QID) | ORAL | Status: DC | PRN

## 2014-06-24 MED ORDER — PANTOPRAZOLE SODIUM 40 MG PO TBEC
40.0000 mg | DELAYED_RELEASE_TABLET | Freq: Every day | ORAL | Status: DC
  Administered 2014-06-24 – 2014-06-28 (×5): 40 mg via ORAL
  Filled 2014-06-24 (×5): qty 1

## 2014-06-24 MED ORDER — SODIUM CHLORIDE 0.9 % IV SOLN
INTRAVENOUS | Status: DC
  Administered 2014-06-24 – 2014-06-25 (×2): via INTRAVENOUS

## 2014-06-24 MED ORDER — CLONAZEPAM 0.5 MG PO TABS
0.5000 mg | ORAL_TABLET | Freq: Every evening | ORAL | Status: DC | PRN
  Administered 2014-06-25 – 2014-06-28 (×3): 0.5 mg via ORAL
  Filled 2014-06-24 (×3): qty 1

## 2014-06-24 MED ORDER — DILTIAZEM HCL 100 MG IV SOLR
5.0000 mg/h | INTRAVENOUS | Status: DC
  Administered 2014-06-24: 5 mg/h via INTRAVENOUS
  Filled 2014-06-24: qty 100

## 2014-06-24 MED ORDER — HEPARIN (PORCINE) IN NACL 100-0.45 UNIT/ML-% IJ SOLN
850.0000 [IU]/h | INTRAMUSCULAR | Status: DC
  Administered 2014-06-24 – 2014-06-25 (×2): 700 [IU]/h via INTRAVENOUS
  Administered 2014-06-26 – 2014-06-28 (×2): 850 [IU]/h via INTRAVENOUS
  Filled 2014-06-24 (×4): qty 250

## 2014-06-24 MED ORDER — METOPROLOL TARTRATE 1 MG/ML IV SOLN: 5.0000 mg | Freq: Once | INTRAVENOUS | Status: DC

## 2014-06-24 MED ORDER — HEPARIN BOLUS VIA INFUSION
2000.0000 [IU] | Freq: Once | INTRAVENOUS | Status: AC
  Administered 2014-06-24: 2000 [IU] via INTRAVENOUS
  Filled 2014-06-24: qty 2000

## 2014-06-24 MED ORDER — ASPIRIN EC 81 MG PO TBEC
81.0000 mg | DELAYED_RELEASE_TABLET | Freq: Every day | ORAL | Status: DC
  Administered 2014-06-24 – 2014-06-28 (×5): 81 mg via ORAL
  Filled 2014-06-24 (×5): qty 1

## 2014-06-24 MED ORDER — CYCLOBENZAPRINE HCL 10 MG PO TABS
5.0000 mg | ORAL_TABLET | Freq: Once | ORAL | Status: AC
  Administered 2014-06-24: 5 mg via ORAL
  Filled 2014-06-24: qty 1

## 2014-06-24 MED ORDER — DOCUSATE SODIUM 100 MG PO CAPS
100.0000 mg | ORAL_CAPSULE | Freq: Every day | ORAL | Status: DC
Start: 2014-06-24 — End: 2014-06-29
  Administered 2014-06-24 – 2014-06-28 (×5): 100 mg via ORAL
  Filled 2014-06-24 (×5): qty 1

## 2014-06-24 MED ORDER — ONDANSETRON HCL 4 MG/2ML IJ SOLN
4.0000 mg | Freq: Four times a day (QID) | INTRAMUSCULAR | Status: DC | PRN
  Administered 2014-06-25: 4 mg via INTRAVENOUS
  Filled 2014-06-24: qty 2

## 2014-06-24 NOTE — Progress Notes (Signed)
Spoke with K. Shorr to get pain medications ordered for patient. Informed her that patient's HR had continued to increase even after receiving her PO and IV dose of metoprolol prior to my shift. She asked for me to contact cardiology instead.

## 2014-06-24 NOTE — Consult Note (Signed)
Consulting cardiologist: Dr Dina Rich MD  Clinical Summary Gloria [name] is a 80 y.o.female hx of HTN, afib, GERD, esophageal stricture with multiple dilatations with chronic epigastric pain admitted with syncope. Family reports patient has recently has poor oral intake due to chronic GI issues.   Patient stood up from commode and walked to sink brush her teeth, suddenly became unresponsive. Family lowered her to the floor and began performing CPR, from notes loss of pulse was not confirmed prior to starting. She denies any chest pain, but had been feeling SOB most of that day.  Upon EMS arrival patient found to be in afib with RVR. Notes from 06/2010 mention history of paroxysmal afib, from records she was not an anticoag candidate due to "chronic anemia with GI blood loss", rates controlled at that time with cardizem. She reports afib happened only during that occasion in setting of pneumonia. She however does not feel palpitations, even this admission with afib with RVR, and thus chronicity is unclear.   Pro-BNP 14039, INR 1.2, trop peak 1.00 trending down, K 4.1, Cr 2.33 (baseline 0.9), Hgb 9.4, Plt 198, MCV 92,  CXR no acute process EKG afib rate 140, no ischemic changes, low voltage Echo 06/18/14 LVEF 60-65%, no WMAs, abnormal diastolic function grade indeterminate, mod AI.  She has been started on dilt gtt and hep gtt, as well as ASA.   Allergies  Allergen Reactions  . Propoxyphene N-Acetaminophen Other (See Comments)    REACTION: Hallucinations  . Caffeine     REACTION: Heart racing  . Dexlansoprazole     GI upset  . Hydromorphone Hcl Other (See Comments)    hallucination  . Ibuprofen Other (See Comments)    REACTION: Decreased hearing  . Morphine Other (See Comments)    REACTION: Hallucinations  . Propoxyphene Hcl Other (See Comments)    REACTION: Hallucinations  . Meperidine Hcl Palpitations    REACTION: Severe palpatations  . Penicillins Rash  .  Sulfamethoxazole-Trimethoprim Rash    REACTION: Rash    Medications Scheduled Medications: . aspirin EC  81 mg Oral Daily  . docusate sodium  100 mg Oral Daily  . pantoprazole  40 mg Oral Daily     Infusions: . sodium chloride 50 mL/hr at 06/24/14 0200  . diltiazem (CARDIZEM) infusion 5 mg/hr (06/24/14 0927)  . heparin 700 Units/hr (06/24/14 0200)     PRN Medications:  clonazePAM, ondansetron **OR** ondansetron (ZOFRAN) IV   Past Medical History  Diagnosis Date  . Allergy   . B12 deficiency   . Hypertension   . Atrial fibrillation 2011  . GERD (gastroesophageal reflux disease)   . Osteoporosis   . Recurrent UTI   . Esophageal stricture   . Arthritis   . Diverticulosis   . Normocytic anemia   . Chronic epigastric pain     EM Dr. Jarold Motto 2006, low LES pressure, high Demeester, unclear if pt on suppression  . Lumbar stenosis   . Chronic anemia     without evidence of iron deficiency in 2005/2008  . Degenerative disc disease     Past Surgical History  Procedure Laterality Date  . Tonsillectomy    . Abdominal hysterectomy  1969    partial, no cancer  . Appendectomy    . Cholecystectomy  1999  . Esophagogastroduodenoscopy  03/19/10    noncritical appearing Schatzki's ring/small hiatal hernia, adenomatous-appearing mucosa-bx benign  . Esophagogastroduodenoscopy  06/04/09    Dr Cherylynn Ridges  . Colonoscopy  2005  reported Dr Doretha Imus  . Esophagogastroduodenoscopy (egd) with esophageal dilation N/A 06/03/2013    Procedure: ESOPHAGOGASTRODUODENOSCOPY (EGD) WITH ESOPHAGEAL DILATION;  Surgeon: Corbin Ade, MD;  Location: [facility];  Service: Endoscopy;  Laterality: N/A;  2:30  . Esophagogastroduodenoscopy N/A 06/19/2014    Procedure: ESOPHAGOGASTRODUODENOSCOPY (EGD) ED;  Surgeon: Corbin Ade, MD;  Location: [facility];  Service: Endoscopy;  Laterality: N/A;    Family History  Problem Relation Age of Onset  . Hypertension Mother     . Hypertension Father   . Leukemia Father   . Diabetes Brother   . Cancer Brother     melanoma,bone, metastatic unk origin    Social History Ms. Papalia reports that she quit smoking about 3 years ago. Her smoking use included Cigarettes. She has a 6 pack-year smoking history. She does not have any smokeless tobacco history on file. Ms. Wieman reports that she does not drink alcohol.  Review of Systems CONSTITUTIONAL: No weight loss, fever, chills, weakness or fatigue.  HEENT: Eyes: No visual loss, blurred vision, double vision or yellow sclerae. No hearing loss, sneezing, congestion, runny nose or sore throat.  SKIN: No rash or itching.  CARDIOVASCULAR: per HPI RESPIRATORY: No shortness of breath, cough or sputum.  GASTROINTESTINAL: No anorexia, nausea, vomiting or diarrhea. No abdominal pain or blood.  GENITOURINARY: no polyuria, no dysuria NEUROLOGICAL: No headache, dizziness, syncope, paralysis, ataxia, numbness or tingling in the extremities. No change in bowel or bladder control.  MUSCULOSKELETAL: chest wall pain HEMATOLOGIC: No anemia, bleeding or bruising.  LYMPHATICS: No enlarged nodes. No history of splenectomy.  PSYCHIATRIC: No history of depression or anxiety.      Physical Examination Blood pressure 101/51, pulse 99, temperature 97.3 F (36.3 C), temperature source Oral, resp. rate 19, height 5' (1.524 m), weight 109 lb 12.6 oz (49.8 kg), SpO2 97 %.  Intake/Output Summary (Last 24 hours) at 06/24/14 1017 Last data filed at 06/24/14 0644  Gross per 24 hour  Intake 157.59 ml  Output    350 ml  Net -192.41 ml    HEENT: sclera clear  Cardiovascular: irreg, no m/r/g, no JVD  Respiratory: CTAB  GI: abdomen soft, NT, ND  MSK: no LE edema  Neuro: no focal deficits  Psych: appropriate affect   Lab Results  Basic Metabolic Panel:  Recent Labs Lab 06/19/14 0530 06/20/14 0515 06/21/14 0619 06/15/2014 1954 06/24/14 0629  NA 145 145 142 135* 135*  K  3.8 3.5* 4.1 4.1 4.1  CL 110 108 105 98 100  CO2 23 24 25 22 24   GLUCOSE 92 65* 103* 167* 106*  BUN 17 11 19  52* 48*  CREATININE 0.86 0.91 1.49* 2.33* 1.82*  CALCIUM 8.5 8.6 8.9 9.1 8.7    Liver Function Tests:  Recent Labs Lab 06/19/14 0530 06/20/14 0515 06/21/14 0619 06/18/2014 1954 06/24/14 0629  AST 48* 44* 38* 34 28  ALT 39* 36* 34 32 25  ALKPHOS 68 66 68 69 57  BILITOT 0.5 0.5 0.6 0.3 0.3  PROT 6.2 6.1 6.8 7.3 6.1  ALBUMIN 2.9* 2.9* 3.2* 3.2* 2.6*    CBC:  Recent Labs Lab 06/18/14 0416 06/19/14 0530 06/20/14 0515 06/21/14 0619 06/26/2014 1954 06/24/14 0629  WBC 4.3 4.1 5.1 6.2 6.6 6.0  NEUTROABS 2.5 2.0 3.2 4.2 5.5  --   HGB 10.0* 8.8* 9.3* 9.0* 9.4* 8.2*  HCT 30.2* 26.5* 28.5* 28.0* 29.2* 25.3*  MCV 90.1 89.8 91.9 92.1 92.4 91.7  PLT 226 185 190 182 198  173    Cardiac Enzymes:  Recent Labs Lab 06/18/14 0416 06/18/14 0956 07-04-14 1954 06/24/14 0026 06/24/14 0629  TROPONINI <0.30 <0.30 0.62* 1.00* 0.95*    BNP: Invalid input(s): POCBNP     Impression/Recommendations 1. Elevated troponin - likely demand ischemia in setting of afib with RVR and hypotension with decrease in coronary perfusion pressure. ACS is less likely. Troponin trending down, peak of 1.00. EKG without specific ischemic changes.  - will repeat limited echo for acute changes. Anticipate likely Lexiscan MPI tomorrow to further evaluate. No strong indication for invasive testing at this time.   2. Syncope - suspect related to hypovolemia and afib with RVR, episode occurred shortly after standing so likely orthostatic component as well combined with her dehydration - afib rate control, gentle IVF per primary team  3 Paroxysmal afib - unclear chronicity, records mention episode in 2011 associated with pneumonia. She is asymptomatic with her afib so unclear if ongoing - start lopressor 25mg  po q6hrs with hold parameters, wean dilt gtt. Will see how pressure tolerates, may need  digoxin if does not tolerate beta blocker.  - add on Mg, TSH - she has an elevated CHADS2Vasc score of 3, currently on heparin. Continue to discuss risks vs benefits of anticoag with family, with her chronic anemia may not be best option.   4. Anemia - long history of anemia per notes, trending down 9.4-->8.2, suspect due to hemoconcentration as all of her cell lines have trended down. Follow H&H while on heparin  5. AKI - likely prerenal based on history of poor oral intake, Cr trending down with IVF   Dina Rich, M.D.

## 2014-06-24 NOTE — Progress Notes (Signed)
[facility] Card[facility]em drip.

## 2014-06-24 NOTE — Care Management Utilization Note (Signed)
UR review complete.  

## 2014-06-24 NOTE — Progress Notes (Signed)
Back in A Fib RVR  Plan Restart cardizem gtt  Dr. Kalman ShanMurali Acey [place], M.D., [facility] Pulmonary and Critical Care Medicine Staff Physician [facility] Pager: [phone], If no answer or between  15:00h - 7:00h: call [phone]  06/24/2014 10:47 PM

## 2014-06-24 NOTE — Plan of Care (Signed)
Problem: Consults Goal: Atrial Arhythmia Patient Education (See Patient Education module for education specifics.) Outcome: Progressing Goal: Skin Care Protocol Initiated - if Braden Score 18 or less If consults are not indicated, leave blank or document N/A Outcome: Completed/Met Date Met:  06/24/14 Goal: Tobacco Cessation referral if indicated Outcome: Not Applicable Date Met:  25/36/64 Goal: Nutrition Consult-if indicated Outcome: Not Applicable Date Met:  40/34/74 Goal: Diabetes Guidelines if Diabetic/Glucose > 140 If diabetic or lab glucose is > 140 mg/dl - Initiate Diabetes/Hyperglycemia Guidelines & Document Interventions  Outcome: Not Applicable Date Met:  25/95/63  Problem: Phase I Progression Outcomes Goal: Ventricular heart rate < 120/min Outcome: Completed/Met Date Met:  06/24/14 Goal: Anticoagulation Therapy per MD order Outcome: Completed/Met Date Met:  06/24/14 Goal: Heart rate or rhythm control medication Outcome: Completed/Met Date Met:  06/24/14

## 2014-06-24 NOTE — Progress Notes (Signed)
ANTICOAGULATION CONSULT NOTE - Initial Consult  Pharmacy Consult for Heparin Indication: atrial fibrillation  Allergies  Allergen Reactions  . Propoxyphene N-Acetaminophen Other (See Comments)    REACTION: Hallucinations  . Caffeine     REACTION: Heart racing  . Dexlansoprazole     GI upset  . Hydromorphone Hcl Other (See Comments)    hallucination  . Ibuprofen Other (See Comments)    REACTION: Decreased hearing  . Morphine Other (See Comments)    REACTION: Hallucinations  . Propoxyphene Hcl Other (See Comments)    REACTION: Hallucinations  . Meperidine Hcl Palpitations    REACTION: Severe palpatations  . Penicillins Rash  . Sulfamethoxazole-Trimethoprim Rash    REACTION: Rash   Patient Measurements: Height: 5' (152.4 cm) Weight: 109 lb 12.6 oz (49.8 kg) IBW/kg (Calculated) : 45.5  Vital Signs: Temp: 97.6 F (36.4 C) (11/24 0000) Temp Source: Oral (11/23 1954) BP: 100/67 mmHg (11/24 0015) Pulse Rate: 107 (11/24 0015)  Labs:  Recent Labs  06/21/14 0619 06/18/2014 1954  HGB 9.0* 9.4*  HCT 28.0* 29.2*  PLT 182 198  LABPROT  --  15.6*  INR  --  1.23  CREATININE 1.49* 2.33*  TROPONINI  --  0.62*   Estimated Creatinine Clearance: 13.4 mL/min (by C-G formula based on Cr of 2.33).  Medical History: Past Medical History  Diagnosis Date  . Allergy   . B12 deficiency   . Hypertension   . Atrial fibrillation 2011  . GERD (gastroesophageal reflux disease)   . Osteoporosis   . Recurrent UTI   . Esophageal stricture   . Arthritis   . Diverticulosis   . Normocytic anemia   . Chronic epigastric pain     EM Dr. Jarold Motto 2006, low LES pressure, high Demeester, unclear if pt on suppression  . Lumbar stenosis   . Chronic anemia     without evidence of iron deficiency in 2005/2008  . Degenerative disc disease    Medications:  Prescriptions prior to admission  Medication Sig Dispense Refill Last Dose  . calcium-vitamin D 250-100 MG-UNIT per tablet Take 1 tablet  by mouth 2 (two) times daily.     06/04/2014 at Unknown time  . Cholecalciferol (VITAMIN D) 2000 UNITS CAPS Take 1 capsule by mouth daily.   06/14/2014 at Unknown time  . clonazePAM (KLONOPIN) 0.5 MG tablet Take 1 tablet (0.5 mg total) by mouth at bedtime as needed for anxiety. (Patient taking differently: Take 0.125 mg by mouth at bedtime as needed for anxiety. ) 30 tablet 1 06/03/2014 at Unknown time  . cyanocobalamin (,VITAMIN B-12,) 1000 MCG/ML injection Inject 1,000 mcg into the muscle every 30 (thirty) days.   06/03/2014 at Unknown time  . docusate sodium (COLACE) 100 MG capsule Take 100 mg by mouth daily.   06/01/2014 at Unknown time  . ferrous sulfate 325 (65 FE) MG tablet Take 325 mg by mouth daily with breakfast.     06/06/2014 at Unknown time  . pantoprazole (PROTONIX) 40 MG tablet Take 40 mg by mouth daily.   06/16/2014 at Unknown time  . traMADol (ULTRAM) 50 MG tablet Take 1 tablet (50 mg total) by mouth every 6 (six) hours as needed. (Patient taking differently: Take 100 mg by mouth 2 (two) times daily. ) 30 tablet 0 06/27/2014 at Unknown time  . vitamin C (ASCORBIC ACID) 500 MG tablet Take 500 mg by mouth daily.     06/17/2014 at Unknown time    Assessment: 80yo female with afib.  Asked to  initiate IV Heparin.  Pt is small.  Goal of Therapy:  Heparin level 0.3-0.7 units/ml Monitor platelets by anticoagulation protocol: Yes   Plan:  Heparin 2000 units IV bolus now x 1 Heparin infusion at 700 units/hr Heparin level in 6-8 hours then daily Monitor CBC  Ketsia Linebaugh A 06/24/2014,12:24 AM

## 2014-06-24 NOTE — Progress Notes (Signed)
  Echocardiogram 2D Echocardiogram has been performed.  Gloria [name] 06/24/2014, 3:44 PM

## 2014-06-24 NOTE — Progress Notes (Signed)
Contacted Elink to ask about getting something for patient's increased heart rate. Was told to contact cardiology on call. Only thing listed for cardiology was [facility].

## 2014-06-24 NOTE — Progress Notes (Signed)
Called [facility] to let them know that I have not yet received a call from the Doctor they paged. They will try again.

## 2014-06-24 NOTE — Care Management Note (Addendum)
    Page 1 of 2   06/27/2014     12:23:47 PM CARE MANAGEMENT NOTE 06/27/2014  Patient:  Gloria [name],Gloria [name]   Account Number:  [id number]  Date Initiated:  06/24/2014  Documentation initiated by:  Gloria [name],Gloria  Subjective/Objective Assessment:   Pt has recently discharged form hospital and was staying with daughter. Pt had no HH services, medication needs or DME's prior to admission.     Action/Plan:   Pt plans to discharge home with self care. Will continue to follow for CM needs.   Anticipated DC Date:  06/26/2014   Anticipated DC Plan:  HOME/SELF CARE      DC Planning Services  CM consult      [facility]   Choice offered to / List presented to:  C-1 Patient        HH arranged  HH-2 PT      HH agency  [facility]   Status of service:  In process, will continue to follow Medicare Important Message given?  YES (If response is "NO", the following Medicare IM given date fields will be blank) Date Medicare IM given:  06/25/2014 Medicare IM given by:  Gloria [name],Gloria Date Additional Medicare IM given:  06/27/2014 Additional Medicare IM given by:  Gloria [name]  Discharge Disposition:  HOME/SELF CARE  Per UR Regulation:    If discussed at Long Length of Stay Meetings, dates discussed:    Comments:  06/27/2014 1100 Gloria SheriffJessica Childress, RN, MSN, [facility] Dis[facility] plan pending HH vs SNF. Pt does not want to go to SNF. Gloria [name], [facility], has been notified of referral and will obtain pt's information from chart. Will leave instructions for RN over weekend on how to follow through with referral if pt dishcarged home over weekend. If pt here after weekend, will continue to follow for CM needs.  06/24/2014 1100 Gloria SheriffJessica Childress, RN, MSN, PCCN Pt interested in changing PCP to Gloria [name]. Pt instructed to call Gloria [name]'s office after discharge.

## 2014-06-24 NOTE — Progress Notes (Signed)
[facility] PROGRESS NOTE  Gloria MattersJane M [name] ZOX:[id number] DOB: 09/20/1929 DOA: 06/22/2014 PCP: [name],[name], MD  Assessment/Plan: Elevated Troponin -Likely represents demand ischemia in the setting of a fib with RVR, but given history, ACS is also a possibility. -Appreciate cardiology input and recommendations: for a stress test in am. -Troponin appears to have peaked at 1.00.  A Fib with RVR -Was placed on cardizem drip on admission. -Rates are now trending down. -Will be started on lopressor, with goal of weaning off cardizem drip. -Currently on heparin for ?ACS: certainly would benefit from anticoagulation given her chadsvasc score. Continue to discuss risk/benefit of anticoagulation with family.  Syncope -Likely related to a fib with RVR and possibly a component of hypovolemia. -See no need for aggressive syncopal work up at present. -Continue gentle IVF hydration.  Acute Renal Failure -Likely 2/2 decreased intravascular volume. -Continue IVF. -Cr normalized last admission making a component of CKD less likely.  Code Status: DNR Family Communication: Gloria [name] at bedside updated on plan of care.  Disposition Plan: To be determined. Stress test in am   Consultants:  Dr. Wyline MoodBranch, cardiology   Antibiotics:  None   Subjective: Feels fatigued and SOB. Looks very pale and this is different from 3 days ago.  Objective: Filed Vitals:   06/24/14 1000 06/24/14 1100 06/24/14 1146 06/24/14 1200  BP: 93/60 114/65 113/80 117/77  Pulse: 95 93 86 61  Temp:    98.2 F (36.8 C)  TempSrc:    Oral  Resp: 25 17  22   Height:      Weight:      SpO2: 92% 96%  85%    Intake/Output Summary (Last 24 hours) at 06/24/14 1536 Last data filed at 06/24/14 1413  Gross per 24 hour  Intake 347.59 ml  Output    750 ml  Net -402.41 ml   Filed Weights   06/17/2014 1954 06/24/14 0000 06/24/14 0500  Weight: 48.081 kg (106 lb) 49.8 kg (109 lb 12.6 oz) 49.8 kg (109 lb  12.6 oz)    Exam:   General:  AA Ox3  Cardiovascular: tachy, irregular  Respiratory: CTA B  Abdomen: S/NT/ND/+BS  Extremities: 1+ edema   Neurologic:  Non-focal  Data Reviewed: Basic Metabolic Panel:  Recent Labs Lab 06/19/14 0530 06/20/14 0515 06/21/14 0619 06/03/2014 1954 06/24/14 0629  NA 145 145 142 135* 135*  K 3.8 3.5* 4.1 4.1 4.1  CL 110 108 105 98 100  CO2 23 24 25 22 24   GLUCOSE 92 65* 103* 167* 106*  BUN 17 11 19  52* 48*  CREATININE 0.86 0.91 1.49* 2.33* 1.82*  CALCIUM 8.5 8.6 8.9 9.1 8.7  MG  --   --   --   --  1.5   Liver Function Tests:  Recent Labs Lab 06/19/14 0530 06/20/14 0515 06/21/14 0619 06/02/2014 1954 06/24/14 0629  AST 48* 44* 38* 34 28  ALT 39* 36* 34 32 25  ALKPHOS 68 66 68 69 57  BILITOT 0.5 0.5 0.6 0.3 0.3  PROT 6.2 6.1 6.8 7.3 6.1  ALBUMIN 2.9* 2.9* 3.2* 3.2* 2.6*   No results for input(s): LIPASE, AMYLASE in the last 168 hours. No results for input(s): AMMONIA in the last 168 hours. CBC:  Recent Labs Lab 06/18/14 0416 06/19/14 0530 06/20/14 0515 06/21/14 0619 06/13/2014 1954 06/24/14 0629  WBC 4.3 4.1 5.1 6.2 6.6 6.0  NEUTROABS 2.5 2.0 3.2 4.2 5.5  --   HGB 10.0* 8.8* 9.3* 9.0* 9.4* 8.2*  HCT 30.2* 26.5* 28.5* 28.0* 29.2* 25.3*  MCV 90.1 89.8 91.9 92.1 92.4 91.7  PLT 226 185 190 182 198 173   Cardiac Enzymes:  Recent Labs Lab 06/18/14 0956 06/24/2014 1954 06/24/14 0026 06/24/14 0629 06/24/14 1204  TROPONINI <0.30 0.62* 1.00* 0.95* 0.87*   BNP (last 3 results)  Recent Labs  06/17/14 1943 06/03/2014 1954  PROBNP 3241.0* 14039.0*   CBG:  Recent Labs Lab 06/20/14 2353 06/21/14 0309 06/21/14 0537 06/21/14 0806 06/21/14 1028  GLUCAP 124* 100* 97 98 106*    Recent Results (from the past 240 hour(s))  Culture, blood (routine x 2)     Status: None   Collection Time: 06/17/14 10:00 PM  Result Value Ref Range Status   Specimen Description BLOOD RIGHT ANTECUBITAL  Final   Special Requests BOTTLES DRAWN  AEROBIC AND ANAEROBIC 8CC EACH  Final   Culture NO GROWTH 5 DAYS  Final   Report Status 06/22/2014 FINAL  Final  Culture, blood (routine x 2)     Status: None   Collection Time: 06/17/14 10:00 PM  Result Value Ref Range Status   Specimen Description BLOOD LEFT ANTECUBITAL  Final   Special Requests BOTTLES DRAWN AEROBIC AND ANAEROBIC 10CC EACH  Final   Culture NO GROWTH 5 DAYS  Final   Report Status 06/22/2014 FINAL  Final  MRSA PCR Screening     Status: Abnormal   Collection Time: 06/24/14 12:30 AM  Result Value Ref Range Status   MRSA by PCR INVALID RESULTS, SPECIMEN SENT FOR CULTURE (A) NEGATIVE Final    Comment: WAGONER R AT 0356 ON [id number] BY FORSYTH K        The GeneXpert MRSA Assay (FDA approved for NASAL specimens only), is one component of a comprehensive MRSA colonization surveillance program. It is not intended to diagnose MRSA infection nor to guide or monitor treatment for MRSA infections.      Studies: Ct Head Wo Contrast  06/24/2014   CLINICAL DATA:  Syncope.  EXAM: CT HEAD WITHOUT CONTRAST  TECHNIQUE: Contiguous axial images were obtained from the base of the skull through the vertex without intravenous contrast.  COMPARISON:  None.  FINDINGS: Patchy low attenuation within the subcortical and periventricular white matter noted. There is no evidence for acute intracranial infarct, hemorrhage or mass. The paranasal sinuses and mastoid air cells appear clear. The calvarium is intact.  IMPRESSION: 1. No acute intracranial abnormalities identified. 2. Chronic small vessel ischemic disease.   Electronically Signed   By: Signa Kellaylor  Stroud M.D.   On: 06/04/2014 22:28   Dg Chest Portable 1 View  06/01/2014   CLINICAL DATA:  Syncope.  EXAM: PORTABLE CHEST - 1 VIEW  COMPARISON:  06/28/2014 and 06/17/2014  FINDINGS: Heart size and pulmonary vascularity are normal. Tortuosity and calcification of the thoracic aorta. Lungs are clear. No acute osseous abnormality. Old right posterior  rib fracture.  IMPRESSION: No acute abnormalities.   Electronically Signed   By: Geanie CooleyJim  Maxwell M.D.   On: 06/13/2014 20:34    Scheduled Meds: . aspirin EC  81 mg Oral Daily  . docusate sodium  100 mg Oral Daily  . metoprolol tartrate  25 mg Oral Q6H  . pantoprazole  40 mg Oral Daily   Continuous Infusions: . sodium chloride 50 mL/hr at 06/24/14 0200  . diltiazem (CARDIZEM) infusion Stopped (06/24/14 1306)  . heparin 700 Units/hr (06/24/14 1200)    Principal Problem:   Atrial fibrillation with RVR Active Problems:   Elevated troponin  Essential hypertension   ESOPHAGEAL STRICTURE   Constipation   Dyspnea   ARF (acute renal failure)    Time spent: 35 minutes. Greater than 50% of this time was spent in direct contact with the patient coordinating care.    Chaya Jan  [facility] Pager [phone]  If 7PM-7AM, please contact night-coverage at [email or web address], password [facility] 06/24/2014, 3:36 PM  LOS: 1 day

## 2014-06-24 NOTE — Progress Notes (Signed)
ANTICOAGULATION CONSULT NOTE - follow up  Pharmacy Consult for Heparin Indication: atrial fibrillation  Allergies  Allergen Reactions  . Propoxyphene N-Acetaminophen Other (See Comments)    REACTION: Hallucinations  . Caffeine     REACTION: Heart racing  . Dexlansoprazole     GI upset  . Hydromorphone Hcl Other (See Comments)    hallucination  . Ibuprofen Other (See Comments)    REACTION: Decreased hearing  . Morphine Other (See Comments)    REACTION: Hallucinations  . Propoxyphene Hcl Other (See Comments)    REACTION: Hallucinations  . Meperidine Hcl Palpitations    REACTION: Severe palpatations  . Penicillins Rash  . Sulfamethoxazole-Trimethoprim Rash    REACTION: Rash   Patient Measurements: Height: 5' (152.4 cm) Weight: 109 lb 12.6 oz (49.8 kg) IBW/kg (Calculated) : 45.5  Vital Signs: Temp: 97.3 F (36.3 C) (11/24 0746) Temp Source: Oral (11/24 0746) BP: 101/51 mmHg (11/24 0900) Pulse Rate: 99 (11/24 0900)  Labs:  Recent Labs  07-13-2014 1954 06/24/14 0026 06/24/14 0629  HGB 9.4*  --  8.2*  HCT 29.2*  --  25.3*  PLT 198  --  173  LABPROT 15.6*  --   --   INR 1.23  --   --   HEPARINUNFRC  --   --  0.35  CREATININE 2.33*  --  1.82*  TROPONINI 0.62* 1.00* 0.95*   Estimated Creatinine Clearance: 17.1 mL/min (by C-G formula based on Cr of 1.82).  Medical History: Past Medical History  Diagnosis Date  . Allergy   . B12 deficiency   . Hypertension   . Atrial fibrillation 2011  . GERD (gastroesophageal reflux disease)   . Osteoporosis   . Recurrent UTI   . Esophageal stricture   . Arthritis   . Diverticulosis   . Normocytic anemia   . Chronic epigastric pain     EM Dr. Jarold Motto 2006, low LES pressure, high Demeester, unclear if pt on suppression  . Lumbar stenosis   . Chronic anemia     without evidence of iron deficiency in 2005/2008  . Degenerative disc disease    Medications:  Prescriptions prior to admission  Medication Sig Dispense  Refill Last Dose  . calcium-vitamin D 250-100 MG-UNIT per tablet Take 1 tablet by mouth 2 (two) times daily.     07/13/2014 at Unknown time  . Cholecalciferol (VITAMIN D) 2000 UNITS CAPS Take 1 capsule by mouth daily.   07/13/2014 at Unknown time  . clonazePAM (KLONOPIN) 0.5 MG tablet Take 1 tablet (0.5 mg total) by mouth at bedtime as needed for anxiety. (Patient taking differently: Take 0.125 mg by mouth at bedtime as needed for anxiety. ) 30 tablet 1 07-13-14 at Unknown time  . cyanocobalamin (,VITAMIN B-12,) 1000 MCG/ML injection Inject 1,000 mcg into the muscle every 30 (thirty) days.   06/03/2014 at Unknown time  . docusate sodium (COLACE) 100 MG capsule Take 100 mg by mouth daily.   Jul 13, 2014 at Unknown time  . ferrous sulfate 325 (65 FE) MG tablet Take 325 mg by mouth daily with breakfast.     2014-07-13 at Unknown time  . pantoprazole (PROTONIX) 40 MG tablet Take 40 mg by mouth daily.   2014/07/13 at Unknown time  . traMADol (ULTRAM) 50 MG tablet Take 1 tablet (50 mg total) by mouth every 6 (six) hours as needed. (Patient taking differently: Take 100 mg by mouth 2 (two) times daily. ) 30 tablet 0 13-Jul-2014 at Unknown time  . vitamin C (ASCORBIC ACID)  500 MG tablet Take 500 mg by mouth daily.     Jul 02, 2014 at Unknown time    Assessment: 80yo female with afib.  Asked to initiate IV Heparin.  Pt is small.  Heparin level is therapeutic.  Goal of Therapy:  Heparin level 0.3-0.7 units/ml Monitor platelets by anticoagulation protocol: Yes   Plan:   Continue Heparin infusion at 700 units/hr  Heparin level daily  Monitor CBC  Deklin Bieler A 06/24/2014,10:45 AM

## 2014-06-24 NOTE — Progress Notes (Signed)
Contacted [facility] about patient's HR and was told that they would have to page someone to call me back.

## 2014-06-24 NOTE — Progress Notes (Signed)
   06/24/14 0758  Gastrointestinal  Gastrointestinal (WDL) X  Abdomen Inspection Soft;Other (Comment) (more firm in RUQ)  Bowel Sounds Assessment Active  Tenderness Tender  Last BM Date 06/20/14

## 2014-06-25 ENCOUNTER — Inpatient Hospital Stay (HOSPITAL_COMMUNITY): Payer: Medicare HMO

## 2014-06-25 DIAGNOSIS — K5909 Other constipation: Secondary | ICD-10-CM

## 2014-06-25 LAB — BASIC METABOLIC PANEL
ANION GAP: 10 (ref 5–15)
BUN: 32 mg/dL — ABNORMAL HIGH (ref 6–23)
CALCIUM: 8.6 mg/dL (ref 8.4–10.5)
CO2: 26 meq/L (ref 19–32)
CREATININE: 1.29 mg/dL — AB (ref 0.50–1.10)
Chloride: 106 mEq/L (ref 96–112)
GFR calc non Af Amer: 37 mL/min — ABNORMAL LOW (ref >90)
GFR, EST AFRICAN AMERICAN: 43 mL/min — AB (ref >90)
Glucose, Bld: 85 mg/dL (ref 70–99)
Potassium: 3.8 mEq/L (ref 3.7–5.3)
Sodium: 142 mEq/L (ref 137–147)

## 2014-06-25 LAB — CBC
HCT: 24.3 % — ABNORMAL LOW (ref 36.0–46.0)
Hemoglobin: 8.1 g/dL — ABNORMAL LOW (ref 12.0–15.0)
MCH: 30.2 pg (ref 26.0–34.0)
MCHC: 33.3 g/dL (ref 30.0–36.0)
MCV: 90.7 fL (ref 78.0–100.0)
PLATELETS: 200 10*3/uL (ref 150–400)
RBC: 2.68 MIL/uL — ABNORMAL LOW (ref 3.87–5.11)
RDW: 15.2 % (ref 11.5–15.5)
WBC: 4.9 10*3/uL (ref 4.0–10.5)

## 2014-06-25 LAB — HEPARIN LEVEL (UNFRACTIONATED)
HEPARIN UNFRACTIONATED: 0.3 [IU]/mL (ref 0.30–0.70)
Heparin Unfractionated: <0.1 IU/mL — ABNORMAL LOW (ref 0.30–0.70)

## 2014-06-25 MED ORDER — IPRATROPIUM-ALBUTEROL 0.5-2.5 (3) MG/3ML IN SOLN
3.0000 mL | RESPIRATORY_TRACT | Status: DC | PRN
  Administered 2014-06-25: 3 mL via RESPIRATORY_TRACT
  Filled 2014-06-25: qty 3

## 2014-06-25 MED ORDER — PROPOFOL 10 MG/ML IV EMUL: 5.0000 ug/kg/min | INTRAVENOUS | Status: DC

## 2014-06-25 MED ORDER — DILTIAZEM HCL 30 MG PO TABS
30.0000 mg | ORAL_TABLET | Freq: Four times a day (QID) | ORAL | Status: DC
  Administered 2014-06-25 – 2014-06-28 (×13): 30 mg via ORAL
  Filled 2014-06-25 (×14): qty 1

## 2014-06-25 MED ORDER — LEVALBUTEROL HCL 0.63 MG/3ML IN NEBU
0.6300 mg | INHALATION_SOLUTION | RESPIRATORY_TRACT | Status: DC | PRN
  Administered 2014-06-26 (×2): 0.63 mg via RESPIRATORY_TRACT
  Filled 2014-06-25 (×2): qty 3

## 2014-06-25 NOTE — Progress Notes (Signed)
Called Brittney with resp, patient wheezing. She will call to get breathing treatment orders.

## 2014-06-25 NOTE — Plan of Care (Signed)
Problem: Phase I Progression Outcomes Goal: Pain controlled with appropriate interventions Outcome: Progressing Goal: Hemodynamically stable Outcome: Progressing     

## 2014-06-25 NOTE — Progress Notes (Signed)
Pt has a HX of AFIB and she didn't want to take albuterol. The doctor ordered duonebs so RT gave the Pt her first TX with duoneb and then changed her to xopenex PRN due to her AFIB

## 2014-06-25 NOTE — Progress Notes (Signed)
Repeat CXR this AM shows mild chest congestion, will stop her IVF. If SOB symptoms progress consider one time dose of IV lasix. She presented severely volume depleted, would not be aggressive with IV diuresis if needed.  Dominga FerryJ Kenzi Bardwell MD

## 2014-06-25 NOTE — Progress Notes (Signed)
[facility] PROGRESS NOTE  Gloria MattersJane M [name] EPP:[id number]:[id number] DOB: 12/18/1929 DOA: 06/02/14 PCP: [name],[name], MD  Assessment/Plan: 1. Elevated Troponin 1. Cardiology following 2. Peak trop of 1.0 and trending down 3. Suspected demand mismatch in the setting of RVR (below) 4. Cardiology recs for repeat limited 2d echo and plans for Lexiscan MPI on 11/26 5. Presently asymptomatic 2. Afib with RVR 1. Noted to be in RVR overnight requiring cardizem gtt 2. Cardiology following 3. Lopressor PO started per Cardiology with plans to wean cardizem gtt to off 4. CHADSVasc of 3 - unclear of chronic anticoagulation at this point in time 5. On heparin gtt for now 3. Syncope 1. Likely secondary to afib with RVR 2. On gentle IvF 4. ARF 1. Cr improving with gentle IVF hydration 2. Cont to follow renal function 5. Suspected codp exacerbation 1. Remains on min O2 support 2. Marked wheezing on exam - question upper airway 3. Per family at bedside, no known hx of documented copd, although pt is a former smoker, quit 5410yrs ago 4. Will give trial of PRN neb tx 6. DVT prophylaxis 1. On heparin gtt  Code Status: DNR Family Communication: Pt in room, daughter at bedside Disposition Plan: Pending   Consultants:    Procedures:    Antibiotics:    HPI/Subjective: Wheezing this AM. Noted to be tachycardic overnight requiring cardizem gtt.  Objective: Filed Vitals:   06/25/14 1000 06/25/14 1100 06/25/14 1200 06/25/14 1300  BP:  121/60 97/69 117/71  Pulse: 80 72 80 79  Temp:      TempSrc:      Resp: 32 15 20 21   Height:      Weight:      SpO2: 94% 96% 87% 95%    Intake/Output Summary (Last 24 hours) at 06/25/14 1426 Last data filed at 06/25/14 1300  Gross per 24 hour  Intake 2616.5 ml  Output   2175 ml  Net  441.5 ml   Filed Weights   06/24/14 0000 06/24/14 0500 06/25/14 0500  Weight: 49.8 kg (109 lb 12.6 oz) 49.8 kg (109 lb 12.6 oz) 49.7 kg (109 lb 9.1 oz)     Exam:   General:  Awake, in nad  Cardiovascular: regular, s1, s2  Respiratory: normal resp effort, no wheezing  Abdomen: soft,nondistended  Musculoskeletal: perfused, no clubbing   Data Reviewed: Basic Metabolic Panel:  Recent Labs Lab 06/20/14 0515 06/21/14 0619 22-Jun-2014 1954 06/24/14 0629 06/25/14 0423  NA 145 142 135* 135* 142  K 3.5* 4.1 4.1 4.1 3.8  CL 108 105 98 100 106  CO2 24 25 22 24 26   GLUCOSE 65* 103* 167* 106* 85  BUN 11 19 52* 48* 32*  CREATININE 0.91 1.49* 2.33* 1.82* 1.29*  CALCIUM 8.6 8.9 9.1 8.7 8.6  MG  --   --   --  1.5  --    Liver Function Tests:  Recent Labs Lab 06/19/14 0530 06/20/14 0515 06/21/14 0619 22-Jun-2014 1954 06/24/14 0629  AST 48* 44* 38* 34 28  ALT 39* 36* 34 32 25  ALKPHOS 68 66 68 69 57  BILITOT 0.5 0.5 0.6 0.3 0.3  PROT 6.2 6.1 6.8 7.3 6.1  ALBUMIN 2.9* 2.9* 3.2* 3.2* 2.6*   No results for input(s): LIPASE, AMYLASE in the last 168 hours. No results for input(s): AMMONIA in the last 168 hours. CBC:  Recent Labs Lab 06/19/14 0530 06/20/14 0515 06/21/14 0619 22-Jun-2014 1954 06/24/14 0629 06/25/14 0423  WBC 4.1 5.1 6.2 6.6 6.0 4.9  NEUTROABS 2.0 3.2 4.2 5.5  --   --   HGB 8.8* 9.3* 9.0* 9.4* 8.2* 8.1*  HCT 26.5* 28.5* 28.0* 29.2* 25.3* 24.3*  MCV 89.8 91.9 92.1 92.4 91.7 90.7  PLT 185 190 182 198 173 200   Cardiac Enzymes:  Recent Labs Lab 01/24/2014 1954 06/24/14 0026 06/24/14 0629 06/24/14 1204  TROPONINI 0.62* 1.00* 0.95* 0.87*   BNP (last 3 results)  Recent Labs  06/17/14 1943 01/24/2014 1954  PROBNP 3241.0* 14039.0*   CBG:  Recent Labs Lab 06/20/14 2353 06/21/14 0309 06/21/14 0537 06/21/14 0806 06/21/14 1028  GLUCAP 124* 100* 97 98 106*    Recent Results (from the past 240 hour(s))  Culture, blood (routine x 2)     Status: None   Collection Time: 06/17/14 10:00 PM  Result Value Ref Range Status   Specimen Description BLOOD RIGHT ANTECUBITAL  Final   Special Requests BOTTLES  DRAWN AEROBIC AND ANAEROBIC 8CC EACH  Final   Culture NO GROWTH 5 DAYS  Final   Report Status 06/22/2014 FINAL  Final  Culture, blood (routine x 2)     Status: None   Collection Time: 06/17/14 10:00 PM  Result Value Ref Range Status   Specimen Description BLOOD LEFT ANTECUBITAL  Final   Special Requests BOTTLES DRAWN AEROBIC AND ANAEROBIC 10CC EACH  Final   Culture NO GROWTH 5 DAYS  Final   Report Status 06/22/2014 FINAL  Final  MRSA PCR Screening     Status: Abnormal   Collection Time: 06/24/14 12:30 AM  Result Value Ref Range Status   MRSA by PCR INVALID RESULTS, SPECIMEN SENT FOR CULTURE (A) NEGATIVE Final    Comment: WAGONER R AT 0356 ON [id number] BY FORSYTH K        The GeneXpert MRSA Assay (FDA approved for NASAL specimens only), is one component of a comprehensive MRSA colonization surveillance program. It is not intended to diagnose MRSA infection nor to guide or monitor treatment for MRSA infections.   MRSA culture     Status: None (Preliminary result)   Collection Time: 06/24/14 12:30 AM  Result Value Ref Range Status   Specimen Description NOSE  Final   Special Requests NONE  Final   Culture   Final    NO GROWTH 1 DAY Performed at [facility]    Report Status PENDING  Incomplete     Studies: Ct Head Wo Contrast  08-Feb-2014   CLINICAL DATA:  Syncope.  EXAM: CT HEAD WITHOUT CONTRAST  TECHNIQUE: Contiguous axial images were obtained from the base of the skull through the vertex without intravenous contrast.  COMPARISON:  None.  FINDINGS: Patchy low attenuation within the subcortical and periventricular white matter noted. There is no evidence for acute intracranial infarct, hemorrhage or mass. The paranasal sinuses and mastoid air cells appear clear. The calvarium is intact.  IMPRESSION: 1. No acute intracranial abnormalities identified. 2. Chronic small vessel ischemic disease.   Electronically Signed   By: Signa Kellaylor  Stroud M.D.   On: 08-Feb-2014 22:28   Dg  Chest Port 1 View  06/25/2014   CLINICAL DATA:  Shortness of Breath  EXAM: PORTABLE CHEST - 1 VIEW  COMPARISON:  08-Feb-2014  FINDINGS: Cardiomegaly again noted. Central mild vascular congestion and mild interstitial prominence bilaterally suspicious for mild edema or pneumonitis. No segmental infiltrate. Stable old right upper rib fracture.  IMPRESSION: Mild vascular congestion. Mild interstitial prominence bilaterally suspicious for mild edema or pneumonitis. No segmental infiltrate.   Electronically Signed  By: Natasha Mead M.D.   On: 06/25/2014 12:48   Dg Chest Portable 1 View  07-16-2014   CLINICAL DATA:  Syncope.  EXAM: PORTABLE CHEST - 1 VIEW  COMPARISON:  06/28/2014 and 06/17/2014  FINDINGS: Heart size and pulmonary vascularity are normal. Tortuosity and calcification of the thoracic aorta. Lungs are clear. No acute osseous abnormality. Old right posterior rib fracture.  IMPRESSION: No acute abnormalities.   Electronically Signed   By: Geanie Cooley M.D.   On: Jul 16, 2014 20:34    Scheduled Meds: . aspirin EC  81 mg Oral Daily  . diltiazem  30 mg Oral QID  . docusate sodium  100 mg Oral Daily  . pantoprazole  40 mg Oral Daily   Continuous Infusions: . sodium chloride 50 mL/hr at 06/25/14 1300  . diltiazem (CARDIZEM) infusion 5 mg/hr (06/25/14 1300)  . heparin 850 Units/hr (06/25/14 1300)    Principal Problem:   Atrial fibrillation with RVR Active Problems:   Essential hypertension   ESOPHAGEAL STRICTURE   Constipation   Dyspnea   ARF (acute renal failure)   Elevated troponin  Time spent:  CHIU, STEPHEN K  [facility] Pager [phone]. If 7PM-7AM, please contact night-coverage at [email or web address], password [facility] 06/25/2014, 2:26 PM  LOS: 2 days

## 2014-06-25 NOTE — Progress Notes (Signed)
ANTICOAGULATION CONSULT NOTE  Pharmacy Consult for Heparin Indication: atrial fibrillation  Allergies  Allergen Reactions  . Propoxyphene N-Acetaminophen Other (See Comments)    REACTION: Hallucinations  . Caffeine     REACTION: Heart racing  . Dexlansoprazole     GI upset  . Hydromorphone Hcl Other (See Comments)    hallucination  . Ibuprofen Other (See Comments)    REACTION: Decreased hearing  . Morphine Other (See Comments)    REACTION: Hallucinations  . Propoxyphene Hcl Other (See Comments)    REACTION: Hallucinations  . Meperidine Hcl Palpitations    REACTION: Severe palpatations  . Penicillins Rash  . Sulfamethoxazole-Trimethoprim Rash    REACTION: Rash   Patient Measurements: Height: 5' (152.4 cm) Weight: 109 lb 9.1 oz (49.7 kg) IBW/kg (Calculated) : 45.5  Vital Signs: Temp: 97.7 F (36.5 C) (11/25 0743) Temp Source: Oral (11/25 0743) BP: 117/71 mmHg (11/25 1300) Pulse Rate: 79 (11/25 1300)  Labs:  Recent Labs  06/15/2014 1954 06/24/14 0026 06/24/14 0629 06/24/14 1204 06/25/14 0423 06/25/14 1550  HGB 9.4*  --  8.2*  --  8.1*  --   HCT 29.2*  --  25.3*  --  24.3*  --   PLT 198  --  173  --  200  --   LABPROT 15.6*  --   --   --   --   --   INR 1.23  --   --   --   --   --   HEPARINUNFRC  --   --  0.35  --  <0.10* 0.30  CREATININE 2.33*  --  1.82*  --  1.29*  --   TROPONINI 0.62* 1.00* 0.95* 0.87*  --   --    Estimated Creatinine Clearance: 24.2 mL/min (by C-G formula based on Cr of 1.29).  Medical History: Past Medical History  Diagnosis Date  . Allergy   . B12 deficiency   . Hypertension   . Atrial fibrillation 2011  . GERD (gastroesophageal reflux disease)   . Osteoporosis   . Recurrent UTI   . Esophageal stricture   . Arthritis   . Diverticulosis   . Normocytic anemia   . Chronic epigastric pain     EM Dr. Jarold Motto 2006, low LES pressure, high Demeester, unclear if pt on suppression  . Lumbar stenosis   . Chronic anemia     without  evidence of iron deficiency in 2005/2008  . Degenerative disc disease    Medications:  Prescriptions prior to admission  Medication Sig Dispense Refill Last Dose  . calcium-vitamin D 250-100 MG-UNIT per tablet Take 1 tablet by mouth 2 (two) times daily.     06/30/2014 at Unknown time  . Cholecalciferol (VITAMIN D) 2000 UNITS CAPS Take 1 capsule by mouth daily.   06/25/2014 at Unknown time  . clonazePAM (KLONOPIN) 0.5 MG tablet Take 1 tablet (0.5 mg total) by mouth at bedtime as needed for anxiety. (Patient taking differently: Take 0.125 mg by mouth at bedtime as needed for anxiety. ) 30 tablet 1 06/25/2014 at Unknown time  . cyanocobalamin (,VITAMIN B-12,) 1000 MCG/ML injection Inject 1,000 mcg into the muscle every 30 (thirty) days.   06/03/2014 at Unknown time  . docusate sodium (COLACE) 100 MG capsule Take 100 mg by mouth daily.   06/28/2014 at Unknown time  . ferrous sulfate 325 (65 FE) MG tablet Take 325 mg by mouth daily with breakfast.     06/02/2014 at Unknown time  . pantoprazole (PROTONIX) 40  MG tablet Take 40 mg by mouth daily.   07/15/2014 at Unknown time  . traMADol (ULTRAM) 50 MG tablet Take 1 tablet (50 mg total) by mouth every 6 (six) hours as needed. (Patient taking differently: Take 100 mg by mouth 2 (two) times daily. ) 30 tablet 0 2014/07/15 at Unknown time  . vitamin C (ASCORBIC ACID) 500 MG tablet Take 500 mg by mouth daily.     15-Jul-2014 at Unknown time    Assessment: 80yo F with Afib and possible ACS started on IV Heparin.   Noted plan for stress test today & likely no plans for long-term anticoagulation for Afib at this time due to chronic anemia.   Heparin level at goal  Goal of Therapy:  Heparin level 0.3-0.7 units/ml Monitor platelets by anticoagulation protocol: Yes   Plan:   Continue  heparin infusion at 850 units/hr  Heparin level daily   Monitor daily heparin level & CBC  F/U heparin plans post procedure  Raquel James, Nikelle Malatesta Bennett 06/25/2014,4:23  PM

## 2014-06-25 NOTE — Progress Notes (Signed)
Patient ID: Gloria [name], female   DOB: [birth year], 80 y.o.   MRN: [id number]     Subjective:    Elevated heart rates overnight. Increased wheezing overnight.   Objective:   Temp:  [97.7 F (36.5 C)-100.4 F (38 C)] 97.7 F (36.5 C) (11/25 0743) Pulse Rate:  [27-146] 125 (11/25 0700) Resp:  [12-31] 21 (11/25 0700) BP: (90-146)/(51-113) 145/113 mmHg (11/25 0700) SpO2:  [83 %-100 %] 86 % (11/25 0700) Weight:  [109 lb 9.1 oz (49.7 kg)] 109 lb 9.1 oz (49.7 kg) (11/25 0500) Last BM Date: 07/04/14  Filed Weights   06/24/14 0000 06/24/14 0500 06/25/14 0500  Weight: 109 lb 12.6 oz (49.8 kg) 109 lb 12.6 oz (49.8 kg) 109 lb 9.1 oz (49.7 kg)    Intake/Output Summary (Last 24 hours) at 06/25/14 0817 Last data filed at 06/25/14 0700  Gross per 24 hour  Intake 2068.5 ml  Output   2350 ml  Net -281.5 ml    Telemetry: afib elevated rates overnight. Now NSR  Exam:  General: NAD  Resp: bilateral wheezing  Cardiac:regular, no m/r/g  GI: abdomen soft, NT, ND  MSK: no LE edema  Neuro: no focal deficits    Lab Results:  Basic Metabolic Panel:  Recent Labs Lab 07-04-2014 1954 06/24/14 0629 06/25/14 0423  NA 135* 135* 142  K 4.1 4.1 3.8  CL 98 100 106  CO2 22 24 26   GLUCOSE 167* 106* 85  BUN 52* 48* 32*  CREATININE 2.33* 1.82* 1.29*  CALCIUM 9.1 8.7 8.6  MG  --  1.5  --     Liver Function Tests:  Recent Labs Lab 06/21/14 0619 2014-07-04 1954 06/24/14 0629  AST 38* 34 28  ALT 34 32 25  ALKPHOS 68 69 57  BILITOT 0.6 0.3 0.3  PROT 6.8 7.3 6.1  ALBUMIN 3.2* 3.2* 2.6*    CBC:  Recent Labs Lab 07/04/14 1954 06/24/14 0629 06/25/14 0423  WBC 6.6 6.0 4.9  HGB 9.4* 8.2* 8.1*  HCT 29.2* 25.3* 24.3*  MCV 92.4 91.7 90.7  PLT 198 173 200    Cardiac Enzymes:  Recent Labs Lab 06/24/14 0026 06/24/14 0629 06/24/14 1204  TROPONINI 1.00* 0.95* 0.87*    BNP:  Recent Labs  06/17/14 1943 07-04-14 1954  PROBNP 3241.0* 14039.0*     Coagulation:  Recent Labs Lab 2014/07/04 1954  INR 1.23    ECG:   Medications:   Scheduled Medications: . aspirin EC  81 mg Oral Daily  . docusate sodium  100 mg Oral Daily  . metoprolol tartrate  25 mg Oral Q6H  . pantoprazole  40 mg Oral Daily     Infusions: . sodium chloride 50 mL/hr at 06/25/14 0700  . diltiazem (CARDIZEM) infusion 5 mg/hr (06/25/14 0700)  . heparin 700 Units/hr (06/25/14 0811)     PRN Medications:  clonazePAM, ondansetron **OR** ondansetron (ZOFRAN) IV, traMADol     Assessment/Plan   1. Elevated troponin - likely demand ischemia in setting of afib with RVR and hypotension with decrease in coronary perfusion pressure. ACS is less likely. Troponin trending down, peak of 1.00. EKG without specific ischemic changes.  - repeat echo shows normal stable LVEF, no new WMAs. - likely obtain noninvasive stress test once acute issues resolved.    2. Syncope - suspect related to hypovolemia and afib with RVR, episode occurred shortly after standing so likely orthostatic component as well combined with her dehydration - afib rate control, gentle IVF per primary team  3  Paroxysmal afib - unclear chronicity, records mention episode in 2011 associated with pneumonia. She is asymptomatic with her afib so unclear if ongoing - yestarday started on oral lopressor, appears rates increased overnight and dilt gtt was restarted.  - significant wheezing this AM, will stop lopressor and start oral ditliazem.  - she has an elevated CHADS2Vasc score of 3, currently on heparin. Continue to discuss risks vs benefits of anticoag with family, with her chronic anemia may not be best option.   4. Anemia - long history of anemia per notes, trending down 9.4-->8.2, suspect due to hemoconcentration as all of her cell lines have trended down. Follow H&H while on heparin - EGD 06/2014 normal mucosa. 06/2014 ferritin 1898.  - unclear to me the cause of her anemia, will ask  internal medicine to help sort out. Would need to have better understanding prior to starting long term anticoagulation. With normal EGD and high ferritin does not appear to be chronic GI source.  - old heme note 06/2007 describes longstanding normochrmoic normocytic anemia with prior extensive workup. Mention of prior B12 deficiency and was on injections at that time - she would likely need repeat heme evaluation prior to committing to long term anticoagulation with her baseline Hgb of 8-9. Would suggest outpatient heme consult, can readdress anticoag after in cards outpt follow up. Continue heparin.  5. AKI - likely prerenal based on history of poor oral intake, Cr trending down with IVF         Dina Rich, M.D.

## 2014-06-25 NOTE — Progress Notes (Signed)
ANTICOAGULATION CONSULT NOTE  Pharmacy Consult for Heparin Indication: atrial fibrillation  Allergies  Allergen Reactions  . Propoxyphene N-Acetaminophen Other (See Comments)    REACTION: Hallucinations  . Caffeine     REACTION: Heart racing  . Dexlansoprazole     GI upset  . Hydromorphone Hcl Other (See Comments)    hallucination  . Ibuprofen Other (See Comments)    REACTION: Decreased hearing  . Morphine Other (See Comments)    REACTION: Hallucinations  . Propoxyphene Hcl Other (See Comments)    REACTION: Hallucinations  . Meperidine Hcl Palpitations    REACTION: Severe palpatations  . Penicillins Rash  . Sulfamethoxazole-Trimethoprim Rash    REACTION: Rash   Patient Measurements: Height: 5' (152.4 cm) Weight: 109 lb 9.1 oz (49.7 kg) IBW/kg (Calculated) : 45.5  Vital Signs: Temp: 97.7 F (36.5 C) (11/25 0743) Temp Source: Oral (11/25 0743) BP: 145/113 mmHg (11/25 0700) Pulse Rate: 125 (11/25 0700)  Labs:  Recent Labs  06/30/2014 1954 06/24/14 0026 06/24/14 0629 06/24/14 1204 06/25/14 0423  HGB 9.4*  --  8.2*  --  8.1*  HCT 29.2*  --  25.3*  --  24.3*  PLT 198  --  173  --  200  LABPROT 15.6*  --   --   --   --   INR 1.23  --   --   --   --   HEPARINUNFRC  --   --  0.35  --  <0.10*  CREATININE 2.33*  --  1.82*  --  1.29*  TROPONINI 0.62* 1.00* 0.95* 0.87*  --    Estimated Creatinine Clearance: 24.2 mL/min (by C-G formula based on Cr of 1.29).  Medical History: Past Medical History  Diagnosis Date  . Allergy   . B12 deficiency   . Hypertension   . Atrial fibrillation 2011  . GERD (gastroesophageal reflux disease)   . Osteoporosis   . Recurrent UTI   . Esophageal stricture   . Arthritis   . Diverticulosis   . Normocytic anemia   . Chronic epigastric pain     EM Dr. Jarold Motto 2006, low LES pressure, high Demeester, unclear if pt on suppression  . Lumbar stenosis   . Chronic anemia     without evidence of iron deficiency in 2005/2008  .  Degenerative disc disease    Medications:  Prescriptions prior to admission  Medication Sig Dispense Refill Last Dose  . calcium-vitamin D 250-100 MG-UNIT per tablet Take 1 tablet by mouth 2 (two) times daily.     06/22/2014 at Unknown time  . Cholecalciferol (VITAMIN D) 2000 UNITS CAPS Take 1 capsule by mouth daily.   06/12/2014 at Unknown time  . clonazePAM (KLONOPIN) 0.5 MG tablet Take 1 tablet (0.5 mg total) by mouth at bedtime as needed for anxiety. (Patient taking differently: Take 0.125 mg by mouth at bedtime as needed for anxiety. ) 30 tablet 1 06/15/2014 at Unknown time  . cyanocobalamin (,VITAMIN B-12,) 1000 MCG/ML injection Inject 1,000 mcg into the muscle every 30 (thirty) days.   06/03/2014 at Unknown time  . docusate sodium (COLACE) 100 MG capsule Take 100 mg by mouth daily.   06/09/2014 at Unknown time  . ferrous sulfate 325 (65 FE) MG tablet Take 325 mg by mouth daily with breakfast.     06/26/2014 at Unknown time  . pantoprazole (PROTONIX) 40 MG tablet Take 40 mg by mouth daily.   06/20/2014 at Unknown time  . traMADol (ULTRAM) 50 MG tablet Take 1 tablet (  50 mg total) by mouth every 6 (six) hours as needed. (Patient taking differently: Take 100 mg by mouth 2 (two) times daily. ) 30 tablet 0 06/02/2014 at Unknown time  . vitamin C (ASCORBIC ACID) 500 MG tablet Take 500 mg by mouth daily.     06/24/2014 at Unknown time    Assessment: 80yo F with Afib and possible ACS started on IV Heparin.   Noted plan for stress test today & likely no plans for long-term anticoagulation for Afib at this time due to chronic anemia.   Heparin level undetectable this morning.  It is infusing appropriately.  Per patient & nurse report, no interruption of infusion and lab drawn correctly.   No bleeding noted.  CBC reviewed.   Goal of Therapy:  Heparin level 0.3-0.7 units/ml Monitor platelets by anticoagulation protocol: Yes   Plan:   Increase Heparin infusion at 500 units/hr  Check 6 hr  heparin level   Monitor daily heparin level & CBC  F/U heparin plans post procedure  Elson Clan 06/25/2014,9:11 AM

## 2014-06-25 NOTE — Evaluation (Signed)
Physical Therapy Evaluation Patient Details Name: Gloria [name] MRN: [id number] DOB: Feb 10, 1930 Today's Date: 06/25/2014   History of Present Illness  80 yr old female who    has a past medical history of Allergy; B12 deficiency; Hypertension; Atrial fibrillation (2011); GERD (gastroesophageal reflux disease); Osteoporosis; Recurrent UTI; Esophageal stricture; Arthritis; Diverticulosis; Normocytic anemia; Chronic epigastric pain; Lumbar stenosis; Chronic anemia; and Degenerative disc disease.  Pt was discharged from [facility] on 06-21-14 but was readmitted on July 06, 2014 after she lost consciousness.  She was admitted with Afib and increased troponins.  She is currently in the ICU and is quite drowsy.  Clinical Impression   A PT eval was initiated today in the presence of pt's daughter.  Per daughter, pt lives alone and had been independent at home with a cane prior to her last hospital admission.  She stayed with her daughter after her last discharge and was doing well but would not eat or drink much.  Currently, pt tries to be cooperative but is very somnolent and has difficulty staying awake for me.  She states that she did not sleep well.  On manual muscle test, her LE strength appears to be [facility].  I was unable to progress her OOB due to drowsiness.  We will see her again on Friday to progress mobility.  Her daughter is interested in seeking SNF for pt at discharge.  We discussed this and I advised her that we will be able to get a better feel for this after seeing her out of bed.    Follow Up Recommendations Home health PT;SNF (to be determined)    Equipment Recommendations  None recommended by PT    Recommendations for Other Services   none    Precautions / Restrictions Precautions Precautions: Fall Restrictions Weight Bearing Restrictions: No      Mobility  Bed Mobility               General bed mobility comments: pt is very drowsy and unable to stay awake enough to be able to  participate in bed mobility...her LE strength should allow for adequate bed mobility  Transfers                    Ambulation/Gait                Stairs            Wheelchair Mobility    Modified Rankin (Stroke Patients Only)       Balance Overall balance assessment:  (not tested)                                           Pertinent Vitals/Pain Pain Assessment: No/denies pain    Home Living Family/patient expects to be discharged to:: Private residence Living Arrangements: Children Available Help at Discharge: Available 24 hours/day;Family Type of Home: Apartment       Home Layout: One level   Additional Comments: family does not wish for pt to return home alone    Prior Function Level of Independence: Independent with assistive device(s)         Comments: ambulated with a cane     Hand Dominance        Extremity/Trunk Assessment               Lower Extremity Assessment: Overall WFL for tasks assessed  Communication   Communication: No difficulties  Cognition Arousal/Alertness: Lethargic Behavior During Therapy: WFL for tasks assessed/performed Overall Cognitive Status: Within Functional Limits for tasks assessed                      General Comments      Exercises        Assessment/Plan    PT Assessment Patient needs continued PT services  PT Diagnosis Difficulty walking;Generalized weakness   PT Problem List Decreased strength;Decreased activity tolerance;Decreased mobility  PT Treatment Interventions Gait training;Functional mobility training;Therapeutic exercise;Patient/family education   PT Goals (Current goals can be found in the Care Plan section) Acute Rehab PT Goals Patient Stated Goal: none stated PT Goal Formulation: With patient/family Time For Goal Achievement: 07/09/14 Potential to Achieve Goals: Good    Frequency Min 3X/week   Barriers to discharge Decreased  caregiver support daughter works 10 hours/day and son in law would be available to assist but he is on disability because of knee problems    Co-evaluation               End of Session Equipment Utilized During Treatment: Oxygen Activity Tolerance: Patient limited by fatigue;Patient limited by lethargy Patient left: in bed;with call bell/phone within reach;with bed alarm set;with family/visitor present Nurse Communication: Mobility status         Time: 9604-5409 PT Time Calculation (min) (ACUTE ONLY): 28 min   Charges:   PT Evaluation $Initial PT Evaluation Tier I: 1 Procedure     PT G CodesMyrlene Broker L 06/25/2014, 11:47 AM

## 2014-06-26 ENCOUNTER — Inpatient Hospital Stay (HOSPITAL_COMMUNITY): Payer: Medicare HMO

## 2014-06-26 DIAGNOSIS — R0600 Dyspnea, unspecified: Secondary | ICD-10-CM

## 2014-06-26 LAB — HEPARIN LEVEL (UNFRACTIONATED): Heparin Unfractionated: 0.52 IU/mL (ref 0.30–0.70)

## 2014-06-26 LAB — BASIC METABOLIC PANEL
ANION GAP: 13 (ref 5–15)
BUN: 18 mg/dL (ref 6–23)
CO2: 25 meq/L (ref 19–32)
CREATININE: 0.9 mg/dL (ref 0.50–1.10)
Calcium: 8.6 mg/dL (ref 8.4–10.5)
Chloride: 104 mEq/L (ref 96–112)
GFR calc Af Amer: 67 mL/min — ABNORMAL LOW (ref >90)
GFR calc non Af Amer: 58 mL/min — ABNORMAL LOW (ref >90)
Glucose, Bld: 87 mg/dL (ref 70–99)
POTASSIUM: 3.6 meq/L — AB (ref 3.7–5.3)
Sodium: 142 mEq/L (ref 137–147)

## 2014-06-26 LAB — CBC
HCT: 26.7 % — ABNORMAL LOW (ref 36.0–46.0)
Hemoglobin: 8.5 g/dL — ABNORMAL LOW (ref 12.0–15.0)
MCH: 29.6 pg (ref 26.0–34.0)
MCHC: 31.8 g/dL (ref 30.0–36.0)
MCV: 93 fL (ref 78.0–100.0)
PLATELETS: 214 10*3/uL (ref 150–400)
RBC: 2.87 MIL/uL — AB (ref 3.87–5.11)
RDW: 15.5 % (ref 11.5–15.5)
WBC: 7.2 10*3/uL (ref 4.0–10.5)

## 2014-06-26 LAB — MRSA CULTURE

## 2014-06-26 LAB — PRO B NATRIURETIC PEPTIDE: Pro B Natriuretic peptide (BNP): 12315 pg/mL — ABNORMAL HIGH (ref 0–450)

## 2014-06-26 LAB — TROPONIN I: Troponin I: <0.3 ng/mL (ref <0.30)

## 2014-06-26 MED ORDER — FUROSEMIDE 10 MG/ML IJ SOLN
20.0000 mg | Freq: Once | INTRAMUSCULAR | Status: AC
  Administered 2014-06-26: 20 mg via INTRAVENOUS
  Filled 2014-06-26: qty 2

## 2014-06-26 MED ORDER — TRAMADOL HCL 50 MG PO TABS
100.0000 mg | ORAL_TABLET | Freq: Four times a day (QID) | ORAL | Status: DC | PRN
  Administered 2014-06-26 – 2014-06-28 (×6): 100 mg via ORAL
  Filled 2014-06-26 (×6): qty 2

## 2014-06-26 MED ORDER — LEVALBUTEROL HCL 0.63 MG/3ML IN NEBU
0.6300 mg | INHALATION_SOLUTION | Freq: Three times a day (TID) | RESPIRATORY_TRACT | Status: DC
  Administered 2014-06-26 – 2014-06-28 (×7): 0.63 mg via RESPIRATORY_TRACT
  Filled 2014-06-26 (×7): qty 3

## 2014-06-26 NOTE — Progress Notes (Signed)
ANTICOAGULATION CONSULT NOTE  Pharmacy Consult for Heparin Indication: atrial fibrillation  Allergies  Allergen Reactions  . Propoxyphene N-Acetaminophen Other (See Comments)    REACTION: Hallucinations  . Caffeine     REACTION: Heart racing  . Dexlansoprazole     GI upset  . Hydromorphone Hcl Other (See Comments)    hallucination  . Ibuprofen Other (See Comments)    REACTION: Decreased hearing  . Morphine Other (See Comments)    REACTION: Hallucinations  . Propoxyphene Hcl Other (See Comments)    REACTION: Hallucinations  . Meperidine Hcl Palpitations    REACTION: Severe palpatations  . Penicillins Rash  . Sulfamethoxazole-Trimethoprim Rash    REACTION: Rash   Patient Measurements: Height: 5' (152.4 cm) Weight: 111 lb 1.8 oz (50.4 kg) IBW/kg (Calculated) : 45.5  Vital Signs: Temp: 99.2 F (37.3 C) (11/26 0821) Temp Source: Oral (11/26 0821) BP: 143/82 mmHg (11/26 0800) Pulse Rate: 98 (11/26 0800)  Labs:  Recent Labs  06/30/2014 1954  06/24/14 0629 06/24/14 1204 06/25/14 0423 06/25/14 1550 06/26/14 0405  HGB 9.4*  --  8.2*  --  8.1*  --  8.5*  HCT 29.2*  --  25.3*  --  24.3*  --  26.7*  PLT 198  --  173  --  200  --  214  LABPROT 15.6*  --   --   --   --   --   --   INR 1.23  --   --   --   --   --   --   HEPARINUNFRC  --   < > 0.35  --  <0.10* 0.30 0.52  CREATININE 2.33*  --  1.82*  --  1.29*  --  0.90  TROPONINI 0.62*  < > 0.95* 0.87*  --   --  <0.30  < > = values in this interval not displayed. Estimated Creatinine Clearance: 34.6 mL/min (by C-G formula based on Cr of 0.9).  Medical History: Past Medical History  Diagnosis Date  . Allergy   . B12 deficiency   . Hypertension   . Atrial fibrillation 2011  . GERD (gastroesophageal reflux disease)   . Osteoporosis   . Recurrent UTI   . Esophageal stricture   . Arthritis   . Diverticulosis   . Normocytic anemia   . Chronic epigastric pain     EM Dr. Jarold Motto 2006, low LES pressure, high  [place], unclear if pt on suppression  . Lumbar stenosis   . Chronic anemia     without evidence of iron deficiency in 2005/2008  . Degenerative disc disease    Medications:  Prescriptions prior to admission  Medication Sig Dispense Refill Last Dose  . calcium-vitamin D 250-100 MG-UNIT per tablet Take 1 tablet by mouth 2 (two) times daily.     06/25/2014 at Unknown time  . Cholecalciferol (VITAMIN D) 2000 UNITS CAPS Take 1 capsule by mouth daily.   06/06/2014 at Unknown time  . clonazePAM (KLONOPIN) 0.5 MG tablet Take 1 tablet (0.5 mg total) by mouth at bedtime as needed for anxiety. (Patient taking differently: Take 0.125 mg by mouth at bedtime as needed for anxiety. ) 30 tablet 1 06/02/2014 at Unknown time  . cyanocobalamin (,VITAMIN B-12,) 1000 MCG/ML injection Inject 1,000 mcg into the muscle every 30 (thirty) days.   06/03/2014 at Unknown time  . docusate sodium (COLACE) 100 MG capsule Take 100 mg by mouth daily.   06/28/2014 at Unknown time  . ferrous sulfate 325 (65 FE)  MG tablet Take 325 mg by mouth daily with breakfast.     06/09/2014 at Unknown time  . pantoprazole (PROTONIX) 40 MG tablet Take 40 mg by mouth daily.   06/03/2014 at Unknown time  . traMADol (ULTRAM) 50 MG tablet Take 1 tablet (50 mg total) by mouth every 6 (six) hours as needed. (Patient taking differently: Take 100 mg by mouth 2 (two) times daily. ) 30 tablet 0 06/05/2014 at Unknown time  . vitamin C (ASCORBIC ACID) 500 MG tablet Take 500 mg by mouth daily.     06/15/2014 at Unknown time    Assessment: 80yo F with Afib and possible ACS started on IV Heparin.   Noted plan for stress test today & likely no plans for long-term anticoagulation for Afib at this time due to chronic anemia.   Heparin level at goal  Goal of Therapy:  Heparin level 0.3-0.7 units/ml Monitor platelets by anticoagulation protocol: Yes   Plan:   Continue  heparin infusion at 850 units/hr  Heparin level daily   Monitor daily heparin  level & CBC  F/U heparin plans post procedure  Raquel James, Lindy Garczynski Bennett 06/26/2014,9:46 AM

## 2014-06-26 NOTE — Progress Notes (Signed)
New pain to left shoulder, warming pad applied, pain management given at 2200 for chronic back and generalized aches.  Patient denies any chest pain. Vitals stable for this patient BP 123/51 HR in NSR at 83, RR 22 and oxygen 93 on 2 liters.  Needed a breathing treatment for wheezing audible at bedside. Patient remains SOB at rest.

## 2014-06-26 NOTE — Progress Notes (Signed)
[facility] PROGRESS NOTE  Gloria [name] ZOX:[id number] DOB: 02-17-1930 DOA: 2014/07/02 PCP: Kristian Covey, MD  Assessment/Plan: 1. Elevated Troponin 1. Cardiology following 2. Peak trop of 1.0 and trending down 3. Suspected demand mismatch in the setting of RVR (below) 4. Cardiology recs for repeat limited 2d echo and plans for Lexiscan MPI on 11/26 5. Presently asymptomatic 2. Afib with RVR 1. Now off cardizem gtt and on po beta blocker 2. Cardiology following 3. CHADSVasc of 3 - unclear of chronic anticoagulation at this point in time 4. Cont on heparin gtt for now 3. Syncope 1. Likely secondary to afib with RVR 2. On gentle IvF 4. ARF 1. Cr resolved with gentle IVF hydration 2. Cont to follow renal function 5. Suspected codp exacerbation 1. Remains on min O2 support 2. Per family at bedside, no known hx of documented copd, although pt is a former smoker, quit 43yrs ago 3. PRN neb tx 6. DVT prophylaxis 1. On heparin gtt 7. Dyspnea 1. CXR with findings of mild vascular congestion 2. Lasix given overnight 3. Wt currently over 50kg up from 48kg on admit 4. Follow daily wt  Code Status: DNR Family Communication: Pt in room, daughter at bedside Disposition Plan: Pending   Consultants:  Cardiology  Procedures:    Antibiotics:    HPI/Subjective: Noted to be more confused overnight  Objective: Filed Vitals:   06/26/14 0821 06/26/14 1148 06/26/14 1318 06/26/14 1332  BP:    139/113  Pulse:      Temp: 99.2 F (37.3 C) 98.6 F (37 C)    TempSrc: Oral Oral    Resp:      Height:      Weight:      SpO2:   90%     Intake/Output Summary (Last 24 hours) at 06/26/14 1454 Last data filed at 06/26/14 1454  Gross per 24 hour  Intake    626 ml  Output   1000 ml  Net   -374 ml   Filed Weights   06/24/14 0500 06/25/14 0500 06/26/14 0500  Weight: 49.8 kg (109 lb 12.6 oz) 49.7 kg (109 lb 9.1 oz) 50.4 kg (111 lb 1.8 oz)    Exam:   General:  Awake,  in nad  Cardiovascular: regular, s1, s2  Respiratory: normal resp effort, no wheezing  Abdomen: soft,nondistended  Musculoskeletal: perfused, no clubbing   Data Reviewed: Basic Metabolic Panel:  Recent Labs Lab 06/21/14 0619 02-Jul-2014 1954 06/24/14 0629 06/25/14 0423 06/26/14 0405  NA 142 135* 135* 142 142  K 4.1 4.1 4.1 3.8 3.6*  CL 105 98 100 106 104  CO2 25 22 24 26 25   GLUCOSE 103* 167* 106* 85 87  BUN 19 52* 48* 32* 18  CREATININE 1.49* 2.33* 1.82* 1.29* 0.90  CALCIUM 8.9 9.1 8.7 8.6 8.6  MG  --   --  1.5  --   --    Liver Function Tests:  Recent Labs Lab 06/20/14 0515 06/21/14 0619 July 02, 2014 1954 06/24/14 0629  AST 44* 38* 34 28  ALT 36* 34 32 25  ALKPHOS 66 68 69 57  BILITOT 0.5 0.6 0.3 0.3  PROT 6.1 6.8 7.3 6.1  ALBUMIN 2.9* 3.2* 3.2* 2.6*   No results for input(s): LIPASE, AMYLASE in the last 168 hours. No results for input(s): AMMONIA in the last 168 hours. CBC:  Recent Labs Lab 06/20/14 0515 06/21/14 0619 Jul 02, 2014 1954 06/24/14 0629 06/25/14 0423 06/26/14 0405  WBC 5.1 6.2 6.6 6.0 4.9 7.2  NEUTROABS  3.2 4.2 5.5  --   --   --   HGB 9.3* 9.0* 9.4* 8.2* 8.1* 8.5*  HCT 28.5* 28.0* 29.2* 25.3* 24.3* 26.7*  MCV 91.9 92.1 92.4 91.7 90.7 93.0  PLT 190 182 198 173 200 214   Cardiac Enzymes:  Recent Labs Lab 06/09/2014 1954 06/24/14 0026 06/24/14 0629 06/24/14 1204 06/26/14 0405  TROPONINI 0.62* 1.00* 0.95* 0.87* <0.30   BNP (last 3 results)  Recent Labs  06/17/14 1943 06/05/2014 1954 06/26/14 0405  PROBNP 3241.0* 14039.0* 12315.0*   CBG:  Recent Labs Lab 06/20/14 2353 06/21/14 0309 06/21/14 0537 06/21/14 0806 06/21/14 1028  GLUCAP 124* 100* 97 98 106*    Recent Results (from the past 240 hour(s))  Culture, blood (routine x 2)     Status: None   Collection Time: 06/17/14 10:00 PM  Result Value Ref Range Status   Specimen Description BLOOD RIGHT ANTECUBITAL  Final   Special Requests BOTTLES DRAWN AEROBIC AND ANAEROBIC 8CC  EACH  Final   Culture NO GROWTH 5 DAYS  Final   Report Status 06/22/2014 FINAL  Final  Culture, blood (routine x 2)     Status: None   Collection Time: 06/17/14 10:00 PM  Result Value Ref Range Status   Specimen Description BLOOD LEFT ANTECUBITAL  Final   Special Requests BOTTLES DRAWN AEROBIC AND ANAEROBIC 10CC EACH  Final   Culture NO GROWTH 5 DAYS  Final   Report Status 06/22/2014 FINAL  Final  MRSA PCR Screening     Status: Abnormal   Collection Time: 06/24/14 12:30 AM  Result Value Ref Range Status   MRSA by PCR INVALID RESULTS, SPECIMEN SENT FOR CULTURE (A) NEGATIVE Final    Comment: WAGONER R AT 0356 ON [id number] BY FORSYTH K        The GeneXpert MRSA Assay (FDA approved for NASAL specimens only), is one component of a comprehensive MRSA colonization surveillance program. It is not intended to diagnose MRSA infection nor to guide or monitor treatment for MRSA infections.   MRSA culture     Status: None   Collection Time: 06/24/14 12:30 AM  Result Value Ref Range Status   Specimen Description NOSE  Final   Special Requests NONE  Final   Culture   Final    NO STAPHYLOCOCCUS AUREUS ISOLATED Note: No MRSA Isolated Performed at [facility]    Report Status 06/26/2014 FINAL  Final     Studies: Dg Chest 1 View  06/26/2014   CLINICAL DATA:  Headache.  EXAM: CHEST - 1 VIEW  COMPARISON:  06/25/2014  FINDINGS: There is unchanged cardiomegaly. Unchanged fullness of the hila which is likely from vascular enlargement. Diffuse interstitial coarsening persists, with cephalized blood flow. No effusion or pneumothorax.  IMPRESSION: CHF pattern, stable since yesterday.   Electronically Signed   By: Tiburcio PeaJonathan  Watts M.D.   On: 06/26/2014 04:12   Ct Head Wo Contrast  06/26/2014   CLINICAL DATA:  Left neck pain radiating to the shoulder.  Headache.  EXAM: CT HEAD WITHOUT CONTRAST  TECHNIQUE: Contiguous axial images were obtained from the base of the skull through the vertex  without intravenous contrast.  COMPARISON:  06/01/2014  FINDINGS: Skull and Sinuses:Negative for fracture or destructive process. The mastoids, middle ears, and imaged paranasal sinuses are clear.  Orbits: Bilateral cataract resection.  No acute findings.  Brain: No evidence of acute abnormality, such as acute infarction, hemorrhage, hydrocephalus, or mass lesion/mass effect. There is chronic small vessel disease with patchy  ischemic gliosis in the deep cerebral white matter. There is confluent periventricular white matter low-attenuation, which in combination of volume-averaging accounts for the mildly hazy appearance of the occipital horns (which is also stable).  IMPRESSION: 1. No acute intracranial findings. 2. Moderate chronic small vessel disease.   Electronically Signed   By: Tiburcio PeaJonathan  Watts M.D.   On: 06/26/2014 04:10   Dg Chest Port 1 View  06/25/2014   CLINICAL DATA:  Shortness of Breath  EXAM: PORTABLE CHEST - 1 VIEW  COMPARISON:  17-Jan-2014  FINDINGS: Cardiomegaly again noted. Central mild vascular congestion and mild interstitial prominence bilaterally suspicious for mild edema or pneumonitis. No segmental infiltrate. Stable old right upper rib fracture.  IMPRESSION: Mild vascular congestion. Mild interstitial prominence bilaterally suspicious for mild edema or pneumonitis. No segmental infiltrate.   Electronically Signed   By: Natasha MeadLiviu  Pop M.D.   On: 06/25/2014 12:48    Scheduled Meds: . aspirin EC  81 mg Oral Daily  . diltiazem  30 mg Oral QID  . docusate sodium  100 mg Oral Daily  . pantoprazole  40 mg Oral Daily   Continuous Infusions: . diltiazem (CARDIZEM) infusion Stopped (06/25/14 1400)  . heparin 850 Units/hr (06/26/14 0800)    Principal Problem:   Atrial fibrillation with RVR Active Problems:   Essential hypertension   ESOPHAGEAL STRICTURE   Constipation   Dyspnea   ARF (acute renal failure)   Elevated troponin  Time spent: 30min  CHIU, STEPHEN K  [facility] Pager [phone]. If 7PM-7AM, please contact night-coverage at [email or web address], password [email or web address] 06/26/2014, 2:54 PM  LOS: 3 days

## 2014-06-26 NOTE — Progress Notes (Signed)
Patient moaning out with pain, states her left shoulder has no improvement and now her head is hurting and down her left side.  Patient on heparin gtt. Notified MD, orders written, treated pain with ultram 50mg  po. Swallowed pill without cough or incident.

## 2014-06-26 NOTE — Evaluation (Signed)
Received call about pt w/ severe headache, L shoulder pain, L side pain earlier this am. No CP. Currently admitted for elevated trop, afib w/ RVR, syncope, COPD exacerbation, ? CHF.  On heparin and dilt gtt.  Pt - 250 ccs per nursing-euvolemic  Given tramadol for pain in setting of multiple medical allergies.  Head CT WNL-no acute bleed Trop, CBC, CMET, proBNP pending EKG-now sinus rhythm  Follow

## 2014-06-27 ENCOUNTER — Inpatient Hospital Stay (HOSPITAL_COMMUNITY): Payer: Medicare HMO

## 2014-06-27 DIAGNOSIS — R0602 Shortness of breath: Secondary | ICD-10-CM | POA: Insufficient documentation

## 2014-06-27 LAB — CBC
HEMATOCRIT: 26.9 % — AB (ref 36.0–46.0)
Hemoglobin: 8.4 g/dL — ABNORMAL LOW (ref 12.0–15.0)
MCH: 29.5 pg (ref 26.0–34.0)
MCHC: 31.2 g/dL (ref 30.0–36.0)
MCV: 94.4 fL (ref 78.0–100.0)
PLATELETS: 203 10*3/uL (ref 150–400)
RBC: 2.85 MIL/uL — ABNORMAL LOW (ref 3.87–5.11)
RDW: 15.6 % — AB (ref 11.5–15.5)
WBC: 8.4 10*3/uL (ref 4.0–10.5)

## 2014-06-27 LAB — BASIC METABOLIC PANEL
ANION GAP: 10 (ref 5–15)
BUN: 16 mg/dL (ref 6–23)
CALCIUM: 8.7 mg/dL (ref 8.4–10.5)
CO2: 29 mEq/L (ref 19–32)
CREATININE: 0.9 mg/dL (ref 0.50–1.10)
Chloride: 101 mEq/L (ref 96–112)
GFR calc Af Amer: 67 mL/min — ABNORMAL LOW (ref >90)
GFR, EST NON AFRICAN AMERICAN: 58 mL/min — AB (ref >90)
Glucose, Bld: 89 mg/dL (ref 70–99)
Potassium: 3.8 mEq/L (ref 3.7–5.3)
Sodium: 140 mEq/L (ref 137–147)

## 2014-06-27 LAB — MAGNESIUM: Magnesium: 1.2 mg/dL — ABNORMAL LOW (ref 1.5–2.5)

## 2014-06-27 LAB — HEPARIN LEVEL (UNFRACTIONATED): HEPARIN UNFRACTIONATED: 0.45 [IU]/mL (ref 0.30–0.70)

## 2014-06-27 MED ORDER — MAGNESIUM SULFATE 2 GM/50ML IV SOLN
2.0000 g | Freq: Once | INTRAVENOUS | Status: AC
  Administered 2014-06-27: 2 g via INTRAVENOUS
  Filled 2014-06-27: qty 50

## 2014-06-27 MED ORDER — LABETALOL HCL 5 MG/ML IV SOLN
5.0000 mg | INTRAVENOUS | Status: DC | PRN
  Administered 2014-06-27 – 2014-06-28 (×2): 5 mg via INTRAVENOUS
  Filled 2014-06-27 (×2): qty 4

## 2014-06-27 MED ORDER — AMIODARONE HCL 200 MG PO TABS
200.0000 mg | ORAL_TABLET | Freq: Two times a day (BID) | ORAL | Status: DC
  Administered 2014-06-27 – 2014-06-28 (×4): 200 mg via ORAL
  Filled 2014-06-27 (×4): qty 1

## 2014-06-27 NOTE — Progress Notes (Signed)
Two attempts have been made to see pt for PT today.  Her heart rate has been quite elevated (140s-150s) and we have not been able to work with her because of this.  We will hold PT until she is medically stable.

## 2014-06-27 NOTE — Progress Notes (Addendum)
Pt is complaining of being anxious, feeling short of breath, expiratory wheezes noted, Heart rate 120's oxygen 95% 2L/[place]. Pt received breathing treatment. MD paged to make aware. Awaiting further orders. Family at the bedside visiting with patient. Will continue to monitor.

## 2014-06-27 NOTE — Progress Notes (Signed)
[facility] PROGRESS NOTE  Gloria MattersJane M [name] ZOX:[id number] DOB: 04/24/1930 DOA: 06/13/2014 PCP: Gloria CoveyBURCHETTE,Gloria [name], Gloria [name]  Assessment/Plan: 1. Elevated Troponin 1. Cardiology following 2. Peak trop of 1.0 and trending down 3. Suspected demand mismatch in the setting of RVR (below) 4. Presently asymptomatic 5. No recs for stress testing at this time 2. Afib with RVR 1. Now off cardizem gtt and on po beta blocker 2. Cardiology following 3. CHADSVasc of 3 - unclear of chronic anticoagulation at this point in time 4. Cont on heparin gtt for now 5. HR in th 120's this AM 6. Mg is 1.2 - will replace 3. Syncope 1. Likely secondary to afib with RVR 2. On gentle IvF 4. ARF 1. Cr resolved with gentle IVF hydration 2. Cont to follow renal function 5. Suspected codp exacerbation 1. On 3L [place] 2. No known hx of documented copd, although pt is a former smoker, quit 3775yrs ago 3. Cont neb tx 6. DVT prophylaxis 1. On heparin gtt 7. Dyspnea 1. Recent CXR with findings of mild vascular congestion 2. Recent sob improved with lasix 3. Daily wt: 50kg->48kg 4. Follow daily wt 8. Hypomagnesemia 1. Mg of 1.2 2. Will replace  Code Status: DNR Family Communication: Pt in room, daughter at bedside Disposition Plan: Pending   Consultants:  Cardiology  Procedures:    Antibiotics:    HPI/Subjective: No acute events noted overnight  Objective: Filed Vitals:   06/27/14 0600 06/27/14 0649 06/27/14 0700 06/27/14 0800  BP: 113/99  105/74 98/66  Pulse: 129  125 135  Temp:   98.5 F (36.9 C)   TempSrc:   Oral   Resp: 20  16 19   Height:      Weight:      SpO2: 89% 96% 95% 96%    Intake/Output Summary (Last 24 hours) at 06/27/14 0847 Last data filed at 06/27/14 0600  Gross per 24 hour  Intake    204 ml  Output    775 ml  Net   -571 ml   Filed Weights   06/25/14 0500 06/26/14 0500 06/27/14 0500  Weight: 49.7 kg (109 lb 9.1 oz) 50.4 kg (111 lb 1.8 oz) 48.8 kg (107 lb 9.4 oz)     Exam:   General:  Awake, in nad  Cardiovascular: regular, s1, s2  Respiratory: normal resp effort, no wheezing  Abdomen: soft,nondistended  Musculoskeletal: perfused, no clubbing   Data Reviewed: Basic Metabolic Panel:  Recent Labs Lab 06/19/2014 1954 06/24/14 0629 06/25/14 0423 06/26/14 0405 06/27/14 0450 06/27/14 0759  NA 135* 135* 142 142 140  --   K 4.1 4.1 3.8 3.6* 3.8  --   CL 98 100 106 104 101  --   CO2 22 24 26 25 29   --   GLUCOSE 167* 106* 85 87 89  --   BUN 52* 48* 32* 18 16  --   CREATININE 2.33* 1.82* 1.29* 0.90 0.90  --   CALCIUM 9.1 8.7 8.6 8.6 8.7  --   MG  --  1.5  --   --   --  1.2*   Liver Function Tests:  Recent Labs Lab 06/21/14 0619 06/03/2014 1954 06/24/14 0629  AST 38* 34 28  ALT 34 32 25  ALKPHOS 68 69 57  BILITOT 0.6 0.3 0.3  PROT 6.8 7.3 6.1  ALBUMIN 3.2* 3.2* 2.6*   No results for input(s): LIPASE, AMYLASE in the last 168 hours. No results for input(s): AMMONIA in the last 168 hours. CBC:  Recent  Labs Lab 06/21/14 0619 06/28/2014 1954 06/24/14 0629 06/25/14 0423 06/26/14 0405 06/27/14 0450  WBC 6.2 6.6 6.0 4.9 7.2 8.4  NEUTROABS 4.2 5.5  --   --   --   --   HGB 9.0* 9.4* 8.2* 8.1* 8.5* 8.4*  HCT 28.0* 29.2* 25.3* 24.3* 26.7* 26.9*  MCV 92.1 92.4 91.7 90.7 93.0 94.4  PLT 182 198 173 200 214 203   Cardiac Enzymes:  Recent Labs Lab 06/09/2014 1954 06/24/14 0026 06/24/14 0629 06/24/14 1204 06/26/14 0405  TROPONINI 0.62* 1.00* 0.95* 0.87* <0.30   BNP (last 3 results)  Recent Labs  06/17/14 1943 06/19/2014 1954 06/26/14 0405  PROBNP 3241.0* 14039.0* 12315.0*   CBG:  Recent Labs Lab 06/20/14 2353 06/21/14 0309 06/21/14 0537 06/21/14 0806 06/21/14 1028  GLUCAP 124* 100* 97 98 106*    Recent Results (from the past 240 hour(s))  Culture, blood (routine x 2)     Status: None   Collection Time: 06/17/14 10:00 PM  Result Value Ref Range Status   Specimen Description BLOOD RIGHT ANTECUBITAL  Final    Special Requests BOTTLES DRAWN AEROBIC AND ANAEROBIC 8CC EACH  Final   Culture NO GROWTH 5 DAYS  Final   Report Status 06/22/2014 FINAL  Final  Culture, blood (routine x 2)     Status: None   Collection Time: 06/17/14 10:00 PM  Result Value Ref Range Status   Specimen Description BLOOD LEFT ANTECUBITAL  Final   Special Requests BOTTLES DRAWN AEROBIC AND ANAEROBIC 10CC EACH  Final   Culture NO GROWTH 5 DAYS  Final   Report Status 06/22/2014 FINAL  Final  MRSA PCR Screening     Status: Abnormal   Collection Time: 06/24/14 12:30 AM  Result Value Ref Range Status   MRSA by PCR INVALID RESULTS, SPECIMEN SENT FOR CULTURE (A) NEGATIVE Final    Comment: WAGONER R AT 0356 ON [id number] BY FORSYTH K        The GeneXpert MRSA Assay (FDA approved for NASAL specimens only), is one component of a comprehensive MRSA colonization surveillance program. It is not intended to diagnose MRSA infection nor to guide or monitor treatment for MRSA infections.   MRSA culture     Status: None   Collection Time: 06/24/14 12:30 AM  Result Value Ref Range Status   Specimen Description NOSE  Final   Special Requests NONE  Final   Culture   Final    NO STAPHYLOCOCCUS AUREUS ISOLATED Note: No MRSA Isolated Performed at [facility]    Report Status 06/26/2014 FINAL  Final     Studies: Dg Chest 1 View  06/26/2014   CLINICAL DATA:  Headache.  EXAM: CHEST - 1 VIEW  COMPARISON:  06/25/2014  FINDINGS: There is unchanged cardiomegaly. Unchanged fullness of the hila which is likely from vascular enlargement. Diffuse interstitial coarsening persists, with cephalized blood flow. No effusion or pneumothorax.  IMPRESSION: CHF pattern, stable since yesterday.   Electronically Signed   By: Tiburcio PeaJonathan  Watts M.D.   On: 06/26/2014 04:12   Ct Head Wo Contrast  06/26/2014   CLINICAL DATA:  Left neck pain radiating to the shoulder.  Headache.  EXAM: CT HEAD WITHOUT CONTRAST  TECHNIQUE: Contiguous axial images were  obtained from the base of the skull through the vertex without intravenous contrast.  COMPARISON:  06/21/2014  FINDINGS: Skull and Sinuses:Negative for fracture or destructive process. The mastoids, middle ears, and imaged paranasal sinuses are clear.  Orbits: Bilateral cataract resection.  No acute  findings.  Brain: No evidence of acute abnormality, such as acute infarction, hemorrhage, hydrocephalus, or mass lesion/mass effect. There is chronic small vessel disease with patchy ischemic gliosis in the deep cerebral white matter. There is confluent periventricular white matter low-attenuation, which in combination of volume-averaging accounts for the mildly hazy appearance of the occipital horns (which is also stable).  IMPRESSION: 1. No acute intracranial findings. 2. Moderate chronic small vessel disease.   Electronically Signed   By: Tiburcio Pea M.D.   On: 06/26/2014 04:10   Dg Chest Port 1 View  06/25/2014   CLINICAL DATA:  Shortness of Breath  EXAM: PORTABLE CHEST - 1 VIEW  COMPARISON:  07-20-14  FINDINGS: Cardiomegaly again noted. Central mild vascular congestion and mild interstitial prominence bilaterally suspicious for mild edema or pneumonitis. No segmental infiltrate. Stable old right upper rib fracture.  IMPRESSION: Mild vascular congestion. Mild interstitial prominence bilaterally suspicious for mild edema or pneumonitis. No segmental infiltrate.   Electronically Signed   By: Natasha Mead M.D.   On: 06/25/2014 12:48    Scheduled Meds: . amiodarone  200 mg Oral BID  . aspirin EC  81 mg Oral Daily  . diltiazem  30 mg Oral QID  . docusate sodium  100 mg Oral Daily  . levalbuterol  0.63 mg Nebulization TID  . magnesium sulfate 1 - 4 g bolus IVPB  2 g Intravenous Once  . pantoprazole  40 mg Oral Daily   Continuous Infusions: . diltiazem (CARDIZEM) infusion Stopped (06/25/14 1400)  . heparin 850 Units/hr (06/27/14 0600)    Principal Problem:   Atrial fibrillation with RVR Active  Problems:   Essential hypertension   ESOPHAGEAL STRICTURE   Constipation   Dyspnea   ARF (acute renal failure)   Elevated troponin  Time spent:  CHIU, STEPHEN K  [facility] Pager [phone]. If 7PM-7AM, please contact night-coverage at [email or web address], password [facility] 06/27/2014, 8:47 AM  LOS: 4 days

## 2014-06-27 NOTE — Clinical Social Work Psychosocial (Signed)
Clinical Social Work Department BRIEF PSYCHOSOCIAL ASSESSMENT 06/27/2014  Patient:  Gloria [name], Gloria [name]     Account Number:  [id number]     Admit date:  06/14/2014  Clinical Social Worker:  Legrand Como  Date/Time:  06/27/2014 11:59 AM  Referred by:  CSW  Date Referred:  06/27/2014 Referred for  SNF Placement   Other Referral:   Interview type:  Patient Other interview type:    PSYCHOSOCIAL DATA Living Status:  ALONE Admitted from facility:   Level of care:   Primary support name:  Gloria [name] Primary support relationship to patient:  CHILD, ADULT Degree of support available:   Patient's daughter is supportive.    CURRENT CONCERNS Current Concerns  Post-Acute Placement   Other Concerns:    SOCIAL WORK ASSESSMENT / PLAN CSW met with patient at bedside. Patient was oriented x4. Patient indicated that she lives alone in her own apartment.  She indicated that she completes all of her ADL's independently. Patient indicated that she ambulates with the assistance of a cane or walker.  Patient indicated that she still drives.  She indicated that she has one daughter who assists her when needed.  She indicated that her daughter calls her every when she is on her way to work as well as in the evening when she gets off of work.  She stated that her daughter lives nearby.  CSW inquired about patient's thoughts related to the possibility of needing short term SNF placement for rehab upon discharge.  Patient indicated that she did not desire to go to a SNF if she had to stay overnight.  Patient indicated that she would prefer to receive the therapy in her home.   Assessment/plan status:   Other assessment/ plan:   Information/referral to community resources:    PATIENT'S/FAMILY'S RESPONSE TO PLAN OF CARE: At this time patient does not wish to go to SNF upon discharge.   Gloria [name], [place]

## 2014-06-27 NOTE — Progress Notes (Signed)
Primary cardiologist: Dr. Dina Rich  Seen for followup: PAF, syncope, abnormal troponin I  Subjective:    States breathing is better, no chest pain. Does not feel palpitations.  Objective:   Temp:  [98.2 F (36.8 C)-100.4 F (38 C)] 98.5 F (36.9 C) (11/27 0700) Pulse Rate:  [31-143] 135 (11/27 0800) Resp:  [12-22] 19 (11/27 0800) BP: (98-143)/(60-113) 98/66 mmHg (11/27 0800) SpO2:  [88 %-100 %] 96 % (11/27 0800) FiO2 (%):  [28 %] 28 % (11/26 1500) Weight:  [107 lb 9.4 oz (48.8 kg)] 107 lb 9.4 oz (48.8 kg) (11/27 0500) Last BM Date: 06/24/2014  Filed Weights   06/25/14 0500 06/26/14 0500 06/27/14 0500  Weight: 109 lb 9.1 oz (49.7 kg) 111 lb 1.8 oz (50.4 kg) 107 lb 9.4 oz (48.8 kg)    Intake/Output Summary (Last 24 hours) at 06/27/14 0821 Last data filed at 06/27/14 0600  Gross per 24 hour  Intake    204 ml  Output    775 ml  Net   -571 ml    Telemetry: Atrial flutter.  Exam:  General: Frail elderly woman, mildly short of breath.  Lungs: Coarse breath sounds, no wheeze.  Cardiac: Irregular, no gallop.  Abdomen: NABS.  Extremities: No pitting.   Lab Results:  Basic Metabolic Panel:  Recent Labs Lab 06/24/14 0629 06/25/14 0423 06/26/14 0405 06/27/14 0450  NA 135* 142 142 140  K 4.1 3.8 3.6* 3.8  CL 100 106 104 101  CO2 24 26 25 29   GLUCOSE 106* 85 87 89  BUN 48* 32* 18 16  CREATININE 1.82* 1.29* 0.90 0.90  CALCIUM 8.7 8.6 8.6 8.7  MG 1.5  --   --   --     Liver Function Tests:  Recent Labs Lab 06/21/14 0619 06/27/2014 1954 06/24/14 0629  AST 38* 34 28  ALT 34 32 25  ALKPHOS 68 69 57  BILITOT 0.6 0.3 0.3  PROT 6.8 7.3 6.1  ALBUMIN 3.2* 3.2* 2.6*    CBC:  Recent Labs Lab 06/25/14 0423 06/26/14 0405 06/27/14 0450  WBC 4.9 7.2 8.4  HGB 8.1* 8.5* 8.4*  HCT 24.3* 26.7* 26.9*  MCV 90.7 93.0 94.4  PLT 200 214 203    Cardiac Enzymes:  Recent Labs Lab 06/24/14 0629 06/24/14 1204 06/26/14 0405  TROPONINI 0.95* 0.87*  <0.30    Echocardiogram (06/24/2014): Study Conclusions  - Limited study to evaluate LV and RV function. - Left ventricle: The cavity size was normal. Wall thickness was increased in a pattern of mild LVH. Systolic function was normal. The estimated ejection fraction was in the range of 60% to 65%. Wall motion was normal; there were no regional wall motion abnormalities. - Mitral valve: There was mild to moderate regurgitation. - Right ventricle: The cavity size was mildly dilated. Systolic function was normal. Lateral annulus peak S velocity: 11.1 cm/s. - Tricuspid valve: There was moderate regurgitation. TR vena contracta is 0.4 cm . - Pulmonary arteries: Systolic pressure was moderately increased. PA peak pressure: 46 mm Hg (S). - Inferior vena cava: The vessel was dilated. The respirophasic diameter changes were blunted (< 50%), consistent with elevated central venous pressure.   Medications:   Scheduled Medications: . aspirin EC  81 mg Oral Daily  . diltiazem  30 mg Oral QID  . docusate sodium  100 mg Oral Daily  . levalbuterol  0.63 mg Nebulization TID  . pantoprazole  40 mg Oral Daily     Infusions: . diltiazem (  CARDIZEM) infusion Stopped (06/25/14 1400)  . heparin 850 Units/hr (06/27/14 0600)     PRN Medications:  clonazePAM, levalbuterol, ondansetron **OR** ondansetron (ZOFRAN) IV, traMADol   Assessment:   1. PAF and now atrial flutter with RVR (breathing treatment this AM). On oral Cardizem and ASA. Also remains on Heparin for now, although not planning oral anticoagulation as yet until anemia is better sorted out.  2. Demand ischemia, peak troponin I 1.0. No chest pain. Doubt ACS. Would hold off on stress testing at this point. LVEF 60-65% by echocardiogram.  3. Acute renal insufficiency, creatinine is now down to 0.9 following hydration.  4. Anemia - prior workup noted in Dr. Verna Czech note from 11/26. Consider Hematology followup and  review prior to considered a start on oral anticoagulant.   Plan/Discussion:    Continue ASA and oral Cardizem. Will start Amiodarone to help hold sinus rhythm since RVR suspected to be contributing to demand ischemia. Not pursuing stress testing now - can be considered later when more stable, potentially as outpatient.   Jonelle Sidle, M.D., F.A.C.C.

## 2014-06-27 NOTE — Care Management Utilization Note (Signed)
UR review complete.  

## 2014-06-27 NOTE — Progress Notes (Signed)
ANTICOAGULATION CONSULT NOTE  Pharmacy Consult for Heparin Indication: atrial fibrillation  Allergies  Allergen Reactions  . Propoxyphene N-Acetaminophen Other (See Comments)    REACTION: Hallucinations  . Caffeine     REACTION: Heart racing  . Dexlansoprazole     GI upset  . Hydromorphone Hcl Other (See Comments)    hallucination  . Ibuprofen Other (See Comments)    REACTION: Decreased hearing  . Morphine Other (See Comments)    REACTION: Hallucinations  . Propoxyphene Hcl Other (See Comments)    REACTION: Hallucinations  . Meperidine Hcl Palpitations    REACTION: Severe palpatations  . Penicillins Rash  . Sulfamethoxazole-Trimethoprim Rash    REACTION: Rash   Patient Measurements: Height: 5' (152.4 cm) Weight: 107 lb 9.4 oz (48.8 kg) IBW/kg (Calculated) : 45.5  Vital Signs: Temp: 98.5 F (36.9 C) (11/27 0700) Temp Source: Oral (11/27 0700) BP: 98/66 mmHg (11/27 0800) Pulse Rate: 135 (11/27 0800)  Labs:  Recent Labs  06/24/14 1204  06/25/14 0423 06/25/14 1550 06/26/14 0405 06/27/14 0450  HGB  --   < > 8.1*  --  8.5* 8.4*  HCT  --   --  24.3*  --  26.7* 26.9*  PLT  --   --  200  --  214 203  HEPARINUNFRC  --   < > <0.10* 0.30 0.52 0.45  CREATININE  --   --  1.29*  --  0.90 0.90  TROPONINI 0.87*  --   --   --  <0.30  --   < > = values in this interval not displayed. Estimated Creatinine Clearance: 34.6 mL/min (by C-G formula based on Cr of 0.9).  Medical History: Past Medical History  Diagnosis Date  . Allergy   . B12 deficiency   . Hypertension   . Atrial fibrillation 2011  . GERD (gastroesophageal reflux disease)   . Osteoporosis   . Recurrent UTI   . Esophageal stricture   . Arthritis   . Diverticulosis   . Normocytic anemia   . Chronic epigastric pain     EM Dr. Jarold Motto 2006, low LES pressure, high Demeester, unclear if pt on suppression  . Lumbar stenosis   . Chronic anemia     without evidence of iron deficiency in 2005/2008  .  Degenerative disc disease    Medications:  Prescriptions prior to admission  Medication Sig Dispense Refill Last Dose  . calcium-vitamin D 250-100 MG-UNIT per tablet Take 1 tablet by mouth 2 (two) times daily.     06/09/2014 at Unknown time  . Cholecalciferol (VITAMIN D) 2000 UNITS CAPS Take 1 capsule by mouth daily.   06/08/2014 at Unknown time  . clonazePAM (KLONOPIN) 0.5 MG tablet Take 1 tablet (0.5 mg total) by mouth at bedtime as needed for anxiety. (Patient taking differently: Take 0.125 mg by mouth at bedtime as needed for anxiety. ) 30 tablet 1 06/07/2014 at Unknown time  . cyanocobalamin (,VITAMIN B-12,) 1000 MCG/ML injection Inject 1,000 mcg into the muscle every 30 (thirty) days.   06/03/2014 at Unknown time  . docusate sodium (COLACE) 100 MG capsule Take 100 mg by mouth daily.   06/27/2014 at Unknown time  . ferrous sulfate 325 (65 FE) MG tablet Take 325 mg by mouth daily with breakfast.     06/16/2014 at Unknown time  . pantoprazole (PROTONIX) 40 MG tablet Take 40 mg by mouth daily.   06/12/2014 at Unknown time  . traMADol (ULTRAM) 50 MG tablet Take 1 tablet (50 mg total)  by mouth every 6 (six) hours as needed. (Patient taking differently: Take 100 mg by mouth 2 (two) times daily. ) 30 tablet 0 06/26/2014 at Unknown time  . vitamin C (ASCORBIC ACID) 500 MG tablet Take 500 mg by mouth daily.     06/25/2014 at Unknown time    Assessment: 80yo F with Afib and possible ACS started on IV Heparin. No plans for long-term anticoagulation for Afib at this time due to chronic anemia, hematology consult. Heparin level therapeutic No signs of bleeding  Goal of Therapy:  Heparin level 0.3-0.7 units/ml Monitor platelets by anticoagulation protocol: Yes   Plan:   Continue  heparin infusion at 850 units/hr  Heparin level daily   Monitor daily heparin level & CBC   Gloria [name] 06/27/2014,8:56 AM

## 2014-06-28 LAB — CBC
HCT: 26 % — ABNORMAL LOW (ref 36.0–46.0)
Hemoglobin: 8.3 g/dL — ABNORMAL LOW (ref 12.0–15.0)
MCH: 29.7 pg (ref 26.0–34.0)
MCHC: 31.9 g/dL (ref 30.0–36.0)
MCV: 93.2 fL (ref 78.0–100.0)
PLATELETS: 209 10*3/uL (ref 150–400)
RBC: 2.79 MIL/uL — ABNORMAL LOW (ref 3.87–5.11)
RDW: 15.5 % (ref 11.5–15.5)
WBC: 8.9 10*3/uL (ref 4.0–10.5)

## 2014-06-28 LAB — BASIC METABOLIC PANEL
ANION GAP: 12 (ref 5–15)
BUN: 31 mg/dL — ABNORMAL HIGH (ref 6–23)
CHLORIDE: 97 meq/L (ref 96–112)
CO2: 27 mEq/L (ref 19–32)
Calcium: 8.8 mg/dL (ref 8.4–10.5)
Creatinine, Ser: 1.71 mg/dL — ABNORMAL HIGH (ref 0.50–1.10)
GFR calc Af Amer: 31 mL/min — ABNORMAL LOW (ref >90)
GFR calc non Af Amer: 27 mL/min — ABNORMAL LOW (ref >90)
GLUCOSE: 114 mg/dL — AB (ref 70–99)
Potassium: 4.3 mEq/L (ref 3.7–5.3)
SODIUM: 136 meq/L — AB (ref 137–147)

## 2014-06-28 LAB — MAGNESIUM: Magnesium: 1.7 mg/dL (ref 1.5–2.5)

## 2014-06-28 LAB — HEPARIN LEVEL (UNFRACTIONATED): Heparin Unfractionated: 0.3 IU/mL (ref 0.30–0.70)

## 2014-06-28 MED ORDER — MAGNESIUM SULFATE 2 GM/50ML IV SOLN
2.0000 g | Freq: Once | INTRAVENOUS | Status: AC
  Administered 2014-06-28: 2 g via INTRAVENOUS
  Filled 2014-06-28: qty 50

## 2014-06-28 MED ORDER — SODIUM CHLORIDE 0.9 % IV SOLN: INTRAVENOUS | Status: DC

## 2014-06-28 MED ORDER — SODIUM CHLORIDE 0.9 % IV SOLN
INTRAVENOUS | Status: DC
Start: 2014-06-28 — End: 2014-06-29
  Administered 2014-06-28: 10:00:00 via INTRAVENOUS

## 2014-06-28 NOTE — Progress Notes (Signed)
ANTICOAGULATION CONSULT NOTE  Pharmacy Consult for Heparin Indication: atrial fibrillation  Allergies  Allergen Reactions  . Propoxyphene N-Acetaminophen Other (See Comments)    REACTION: Hallucinations  . Caffeine     REACTION: Heart racing  . Dexlansoprazole     GI upset  . Hydromorphone Hcl Other (See Comments)    hallucination  . Ibuprofen Other (See Comments)    REACTION: Decreased hearing  . Morphine Other (See Comments)    REACTION: Hallucinations  . Propoxyphene Hcl Other (See Comments)    REACTION: Hallucinations  . Meperidine Hcl Palpitations    REACTION: Severe palpatations  . Penicillins Rash  . Sulfamethoxazole-Trimethoprim Rash    REACTION: Rash   Patient Measurements: Height: 5' (152.4 cm) Weight: 107 lb 12.9 oz (48.9 kg) IBW/kg (Calculated) : 45.5  Vital Signs: Temp: 97.3 F (36.3 C) (11/28 0700) Temp Source: Oral (11/28 0700) BP: 111/53 mmHg (11/28 0800) Pulse Rate: 111 (11/28 0800)  Labs:  Recent Labs  06/26/14 0405 06/27/14 0450 06/28/14 0503  HGB 8.5* 8.4* 8.3*  HCT 26.7* 26.9* 26.0*  PLT 214 203 209  HEPARINUNFRC 0.52 0.45 0.30  CREATININE 0.90 0.90  --   TROPONINI <0.30  --   --    Estimated Creatinine Clearance: 34.6 mL/min (by C-G formula based on Cr of 0.9).  Medical History: Past Medical History  Diagnosis Date  . Allergy   . B12 deficiency   . Hypertension   . Atrial fibrillation 2011  . GERD (gastroesophageal reflux disease)   . Osteoporosis   . Recurrent UTI   . Esophageal stricture   . Arthritis   . Diverticulosis   . Normocytic anemia   . Chronic epigastric pain     EM Dr. Jarold Motto 2006, low LES pressure, high Demeester, unclear if pt on suppression  . Lumbar stenosis   . Chronic anemia     without evidence of iron deficiency in 2005/2008  . Degenerative disc disease    Medications:  Prescriptions prior to admission  Medication Sig Dispense Refill Last Dose  . calcium-vitamin D 250-100 MG-UNIT per tablet  Take 1 tablet by mouth 2 (two) times daily.     06/16/2014 at Unknown time  . Cholecalciferol (VITAMIN D) 2000 UNITS CAPS Take 1 capsule by mouth daily.   06/24/2014 at Unknown time  . clonazePAM (KLONOPIN) 0.5 MG tablet Take 1 tablet (0.5 mg total) by mouth at bedtime as needed for anxiety. (Patient taking differently: Take 0.125 mg by mouth at bedtime as needed for anxiety. ) 30 tablet 1 06/21/2014 at Unknown time  . cyanocobalamin (,VITAMIN B-12,) 1000 MCG/ML injection Inject 1,000 mcg into the muscle every 30 (thirty) days.   06/03/2014 at Unknown time  . docusate sodium (COLACE) 100 MG capsule Take 100 mg by mouth daily.   06/08/2014 at Unknown time  . ferrous sulfate 325 (65 FE) MG tablet Take 325 mg by mouth daily with breakfast.     06/07/2014 at Unknown time  . pantoprazole (PROTONIX) 40 MG tablet Take 40 mg by mouth daily.   06/22/2014 at Unknown time  . traMADol (ULTRAM) 50 MG tablet Take 1 tablet (50 mg total) by mouth every 6 (six) hours as needed. (Patient taking differently: Take 100 mg by mouth 2 (two) times daily. ) 30 tablet 0 06/03/2014 at Unknown time  . vitamin C (ASCORBIC ACID) 500 MG tablet Take 500 mg by mouth daily.     06/03/2014 at Unknown time    Assessment: 80yo F with Afib and possible  ACS started on IV Heparin. No plans for long-term anticoagulation for Afib at this time due to chronic anemia, hematology consult. Heparin level therapeutic No signs of bleeding  Goal of Therapy:  Heparin level 0.3-0.7 units/ml Monitor platelets by anticoagulation protocol: Yes   Plan:   Continue  heparin infusion at 850 units/hr  Heparin level daily   Monitor daily heparin level & CBC   Lenola Lockner Bennett 06/28/2014,8:31 AM

## 2014-06-28 NOTE — Progress Notes (Signed)
[facility] PROGRESS NOTE  Gloria [name]:[id number] DOB: 1929/09/16 DOA: 2014-07-03 PCP: Kristian Covey, MD  Assessment/Plan: 1. Elevated Troponin 1. Cardiology following 2. Peak trop of 1.0 and trending down 3. Suspected demand mismatch in the setting of RVR (below) 4. Presently asymptomatic 5. No recs for stress testing at this time 2. Afib with RVR 1. Now off cardizem gtt and on po beta blocker 2. Cardiology following 3. CHADSVasc of 3 - unclear of chronic anticoagulation at this point in time 4. Cont on heparin gtt for now 5. HR in the low 100's 6. Cont to follow and correct electrolytes 3. Syncope 1. Likely secondary to afib with RVR 2. On gentle IvF 4. ARF 1. Cr initially resolved with gentle IVF hydration 2. Repeat BMET with Cr of 1.7 3. Hold lasix for now 4. Gentle IVF for next 24hrs 5. Follow renal fx 5. Suspected codp exacerbation 1. Remains on 3L [place] 2. No known hx of documented copd, although pt is a former smoker, quit 61yrs ago 3. Wheezing auscultated recently, improved with neb tx 4. Cont neb tx 6. DVT prophylaxis 1. On heparin gtt 7. Dyspnea 1. Recent CXR with findings of mild vascular congestion 2. Recent sob improved with lasix 3. Daily wt: 48.8kg->48.9kg 4. Follow daily wt 8. Hypomagnesemia 1. Mg of 1.2 on 11/27 and replaced 2. Repeat of 1.7 - will give an additional 2gm of Mg  Code Status: DNR Family Communication: Pt in room, daughter at bedside Disposition Plan: Pending   Consultants:  Cardiology  Procedures:    Antibiotics:    HPI/Subjective: Feels well. No complaints.  Objective: Filed Vitals:   06/28/14 0600 06/28/14 0659 06/28/14 0700 06/28/14 0800  BP:   93/50 111/53  Pulse: 107  79 111  Temp: 98.2 F (36.8 C)  97.3 F (36.3 C)   TempSrc: Oral  Oral   Resp: 18  15 17   Height: 5' (1.524 m)     Weight: 48.9 kg (107 lb 12.9 oz)     SpO2: 82% 95% 98% 88%    Intake/Output Summary (Last 24 hours) at  06/28/14 0905 Last data filed at 06/27/14 2000  Gross per 24 hour  Intake    120 ml  Output    150 ml  Net    -30 ml   Filed Weights   06/26/14 0500 06/27/14 0500 06/28/14 0600  Weight: 50.4 kg (111 lb 1.8 oz) 48.8 kg (107 lb 9.4 oz) 48.9 kg (107 lb 12.9 oz)    Exam:   General:  Awake, in nad  Cardiovascular: regular, s1, s2  Respiratory: normal resp effort, no wheezing  Abdomen: soft,nondistended  Musculoskeletal: perfused, no clubbing   Data Reviewed: Basic Metabolic Panel:  Recent Labs Lab July 03, 2014 1954 06/24/14 0629 06/25/14 0423 06/26/14 0405 06/27/14 0450 06/27/14 0759  NA 135* 135* 142 142 140  --   K 4.1 4.1 3.8 3.6* 3.8  --   CL 98 100 106 104 101  --   CO2 22 24 26 25 29   --   GLUCOSE 167* 106* 85 87 89  --   BUN 52* 48* 32* 18 16  --   CREATININE 2.33* 1.82* 1.29* 0.90 0.90  --   CALCIUM 9.1 8.7 8.6 8.6 8.7  --   MG  --  1.5  --   --   --  1.2*   Liver Function Tests:  Recent Labs Lab Jul 03, 2014 1954 06/24/14 0629  AST 34 28  ALT 32 25  ALKPHOS  69 57  BILITOT 0.3 0.3  PROT 7.3 6.1  ALBUMIN 3.2* 2.6*   No results for input(s): LIPASE, AMYLASE in the last 168 hours. No results for input(s): AMMONIA in the last 168 hours. CBC:  Recent Labs Lab 06/25/2014 1954 06/24/14 0629 06/25/14 0423 06/26/14 0405 06/27/14 0450 06/28/14 0503  WBC 6.6 6.0 4.9 7.2 8.4 8.9  NEUTROABS 5.5  --   --   --   --   --   HGB 9.4* 8.2* 8.1* 8.5* 8.4* 8.3*  HCT 29.2* 25.3* 24.3* 26.7* 26.9* 26.0*  MCV 92.4 91.7 90.7 93.0 94.4 93.2  PLT 198 173 200 214 203 209   Cardiac Enzymes:  Recent Labs Lab 06/24/2014 1954 06/24/14 0026 06/24/14 0629 06/24/14 1204 06/26/14 0405  TROPONINI 0.62* 1.00* 0.95* 0.87* <0.30   BNP (last 3 results)  Recent Labs  06/17/14 1943 06/02/2014 1954 06/26/14 0405  PROBNP 3241.0* 14039.0* 12315.0*   CBG:  Recent Labs Lab 06/21/14 1028  GLUCAP 106*    Recent Results (from the past 240 hour(s))  MRSA PCR Screening      Status: Abnormal   Collection Time: 06/24/14 12:30 AM  Result Value Ref Range Status   MRSA by PCR INVALID RESULTS, SPECIMEN SENT FOR CULTURE (A) NEGATIVE Final    Comment: WAGONER R AT 0356 ON [id number] BY FORSYTH K        The GeneXpert MRSA Assay (FDA approved for NASAL specimens only), is one component of a comprehensive MRSA colonization surveillance program. It is not intended to diagnose MRSA infection nor to guide or monitor treatment for MRSA infections.   MRSA culture     Status: None   Collection Time: 06/24/14 12:30 AM  Result Value Ref Range Status   Specimen Description NOSE  Final   Special Requests NONE  Final   Culture   Final    NO STAPHYLOCOCCUS AUREUS ISOLATED Note: No MRSA Isolated Performed at [facility]    Report Status 06/26/2014 FINAL  Final     Studies: Dg Chest Port 1 View  06/27/2014   CLINICAL DATA:  Shortness of breath  EXAM: PORTABLE CHEST - 1 VIEW  COMPARISON:  Portable chest x-ray of June 26, 2014  FINDINGS: The lungs are borderline hypoinflated. There is persistent increased density at the left lung base with obscuration of the hemidiaphragm. The cardiac silhouette is enlarged. The pulmonary vascularity is engorged but less conspicuous today. The bony thorax is unremarkable.  IMPRESSION: Improving pulmonary interstitial edema likely of cardiac cause. There is persistent left lower lobe atelectasis and likely small left pleural effusion.   Electronically Signed   By: David  [place]   On: 06/27/2014 14:19    Scheduled Meds: . amiodarone  200 mg Oral BID  . aspirin EC  81 mg Oral Daily  . diltiazem  30 mg Oral QID  . docusate sodium  100 mg Oral Daily  . levalbuterol  0.63 mg Nebulization TID  . pantoprazole  40 mg Oral Daily   Continuous Infusions: . diltiazem (CARDIZEM) infusion Stopped (06/25/14 1400)  . heparin 850 Units/hr (06/28/14 0150)    Principal Problem:   Atrial fibrillation with RVR Active Problems:   Essential  hypertension   ESOPHAGEAL STRICTURE   Constipation   Dyspnea   ARF (acute renal failure)   Elevated troponin   SOB (shortness of breath)  Time spent:  Oceane Fosse K  [facility] Pager [phone]. If 7PM-7AM, please contact night-coverage at [email or web address], password [facility] 06/28/2014, 9:05 AM  LOS: 5 days

## 2014-06-28 NOTE — Progress Notes (Signed)
Pt a/o.vss. Normal sinus rhythm rate 70-80's. O2 on 1L/[place]. No complaints of any distress. Pt is resting in bed with eyes closed. Report given to S.Vilma PraderHeath, RN, caregiver verbalized understanding of report. Pt to be transferred to room 318 via wheelchair.

## 2014-06-28 NOTE — Progress Notes (Signed)
Pt transferred to room 318 from ICU. Pt is alert and oriented and has no complaints at this time. Will continue to monitor.

## 2014-06-30 NOTE — Progress Notes (Signed)
UR chart review completed.  

## 2014-07-01 ENCOUNTER — Telehealth: Payer: Self-pay | Admitting: Internal Medicine

## 2014-07-01 ENCOUNTER — Telehealth: Payer: Self-pay | Admitting: Family Medicine

## 2014-07-01 NOTE — Discharge Summary (Addendum)
Death Summary  Gloria [name] ZOX:[id number] DOB: 03/07/1930 DOA: 06/05/2014  PCP: [name],[name], MD  Admit date: 06/05/2014 Date of Death: 06-12-14  Final Diagnoses:  Principal Problem:   Atrial fibrillation with RVR Active Problems:   Essential hypertension   ESOPHAGEAL STRICTURE   Constipation   Dyspnea   ARF (acute renal failure)   Elevated troponin   SOB (shortness of breath) Possible diastolic chf  History of present illness:  80 yr old female who  has a past medical history of Allergy; B12 deficiency; Hypertension; Atrial fibrillation (2011); GERD (gastroesophageal reflux disease); Osteoporosis; Recurrent UTI; Esophageal stricture; Arthritis; Diverticulosis; Normocytic anemia; Chronic epigastric pain; Lumbar stenosis; Chronic anemia; and Degenerative disc disease. Patient was recently discharged from the hospital on 06/21/2014 after she was admitted for dyspnea, esophagus structure, acute kidney injury. As per family patient has not been eating and drinking well after she was discharged from the hospital and today when she went to bathroom to brush her teeth she became unresponsive. Patient's family member lowered her to the floor and started performing chest compressions and rescue breathing. They did not check the pulse patient became alert after a minute. EMS was called and patient was found to be in rapid A. Fib. She denies chest pain, admits to having shortness of breath. In the ED patient was found to have elevated troponin 0.62, significant elevation of BNP 14039. Chest x-ray does not show pulmonary edema. Cardiology was consulted by the ED physician who recommended heparin infusion. Patient denies nausea vomiting or diarrhea. No fever dysuria urgency or frequency of urination.  Hospital Course:  The patient was admitted to the floor where she was noted to have difficult to control afib with RVR. Cardiology was consulted, who assisted with medically managing the  patient's heart rate. She was also noted to have mildly elevated troponins, likely secondary to demand ischemia from RVR. During the course, the patient was found to have acute renal failure, improved with IVF hydration. She later developed sob with interstitial edema on CXR. IVF were stopped and the patient improved with gentle diuresis. She unfortunately again developed acute renal failure with gentle IVF continued. The patient also was noted to have an episode of sob and wheezing, improved with diuretics as well as bronchodilators. On the early morning of 11/29, the patient was noted to become bradycardic. The patient was noted to be unresponsive at this time with no pulse and no spontaneous respirations. Patient was pronounced at 0106 on 11/29. Of note, pt's daughter, Roderic ScarceScarlett Mcgee, had been updated on all events on a daily basis during this hospital stay and she was in agreement with our plan of care as outlined previously.   Time of death 0106  Signed:  CHIU, STEPHEN K  [facility] 06-12-14, 7:57 AM

## 2014-07-01 NOTE — Telephone Encounter (Signed)
Pt's daughter calling to report patient passed away on 07/01/14 at 1:02am [facility].

## 2014-07-01 NOTE — Telephone Encounter (Signed)
noted 

## 2014-07-01 NOTE — Telephone Encounter (Signed)
Received a call from the daughter this afternoon wanting to let us know that the patient is deceased.

## 2014-07-01 NOTE — Progress Notes (Signed)
Received text pages from [facility] that patient was brady and asystole. Went in to check on pt and she was unresponsive, no pulse and was not breathing. Bertram DenverShaquanda Mckinney, RN also confirmed death with me at 1:06am. Pt was DNR. Called to inform Dr. Conley RollsLe of passing and called daughter Roderic ScarceScarlett Mcgee and informed her her mother had had a change of status and requested that she come to the hospital. Dr. Conley RollsLe came up and saw the patient and filled out death certificate. Family members arrived and were then notified of her passing. Completed call to Donor services where patient was not deemed a candidate. Pt family members stayed for a little while with patient and took her belongings with them. I walked family members down. They were very appreciative of all the care their mother received. [facility] left with pt at 3:00am.

## 2014-07-01 NOTE — Telephone Encounter (Signed)
Noted  

## 2014-07-01 DEATH — deceased

## 2014-07-03 ENCOUNTER — Ambulatory Visit: Payer: Medicare HMO | Admitting: Podiatry

## 2014-08-05 ENCOUNTER — Ambulatory Visit: Payer: Medicare HMO | Admitting: Gastroenterology

## 2015-10-31 IMAGING — CR DG CHEST 1V PORT
1 series · 1 of 1 positions shown · non-contrast
Comparison: 06/23/2014

CLINICAL DATA: Shortness of Breath

EXAM:
PORTABLE CHEST - 1 VIEW

[portable]
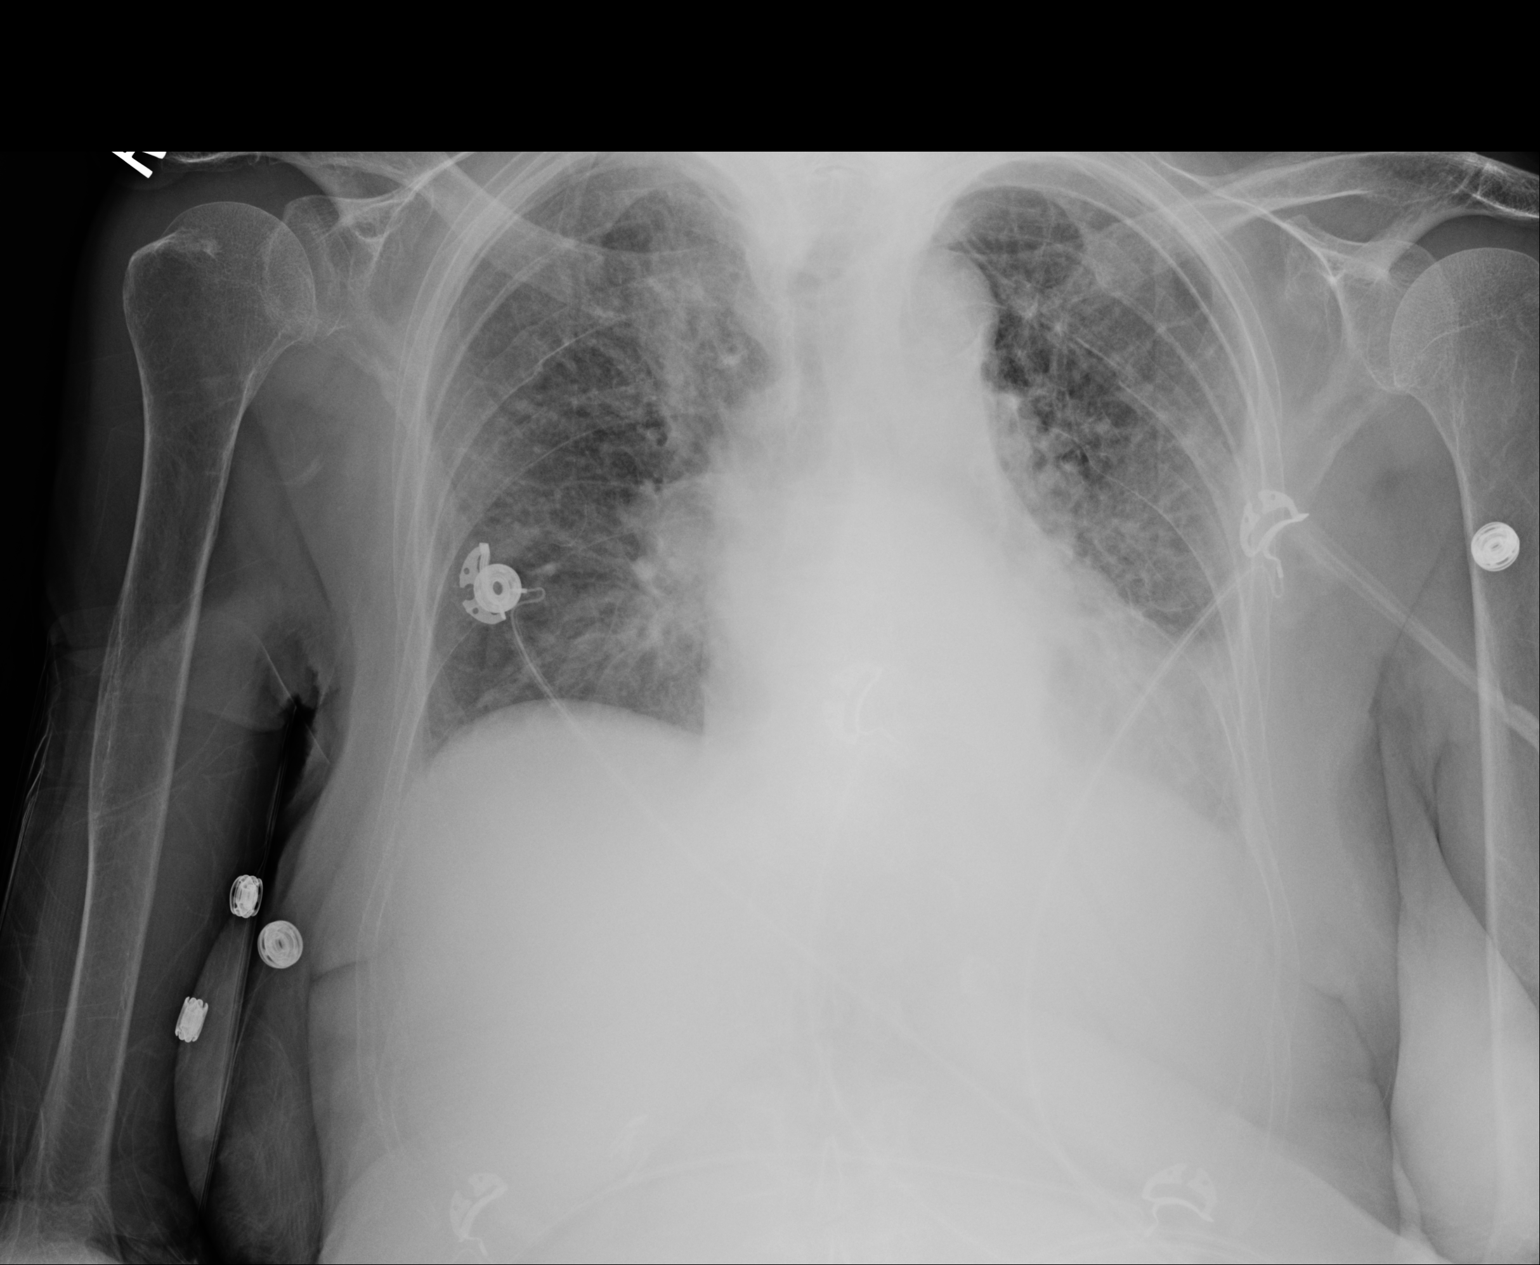

[1 of 1 positions shown; findings below may reference images not displayed]

FINDINGS: Cardiomegaly again noted. Central mild vascular congestion and mild
interstitial prominence bilaterally suspicious for mild edema or
pneumonitis. No segmental infiltrate. Stable old right upper rib
fracture.
IMPRESSION: Mild vascular congestion. Mild interstitial prominence bilaterally
suspicious for mild edema or pneumonitis. No segmental infiltrate.

## 2015-11-01 IMAGING — CT CT HEAD W/O CM
1 of 2 series · 15 of 30 positions shown, 19 images · non-contrast
Comparison: 06/23/2014

CLINICAL DATA: Left neck pain radiating to the shoulder.  Headache.

EXAM:
CT HEAD WITHOUT CONTRAST
TECHNIQUE: Contiguous axial images were obtained from the base of the skull
through the vertex without intravenous contrast.

[Series 2: headseq 4.8 h37s · axial · 0.53mm/px · z∈[+106,+266]mm · 15 of 36 slices shown, 19 images]
[im 2/36  brain]
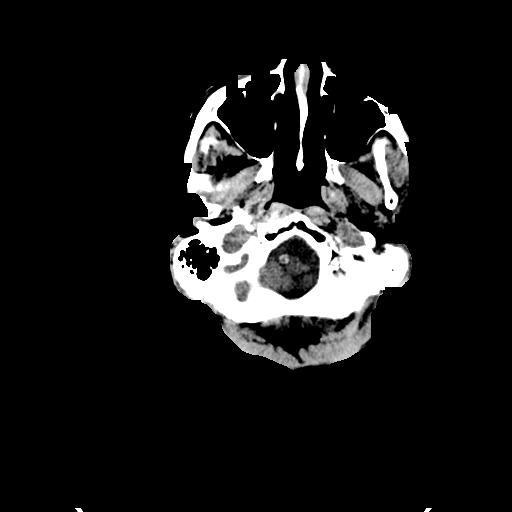
[im 2/36  bone]
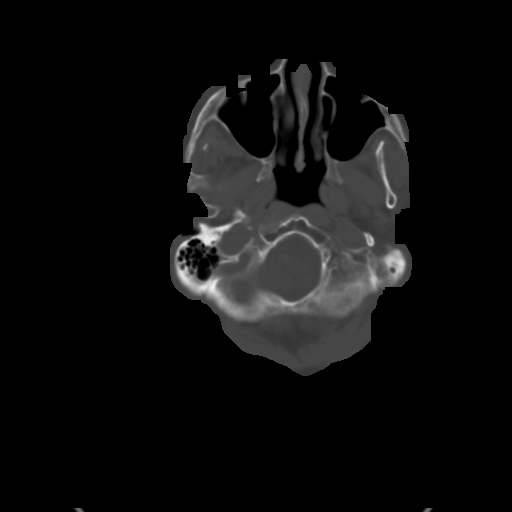
[im 4/36  brain]
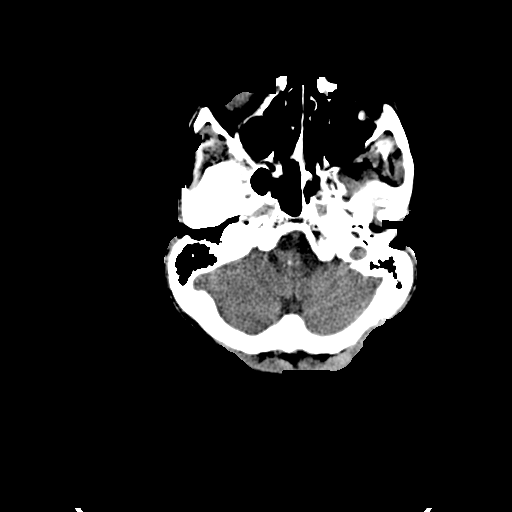
[im 8/36  brain]
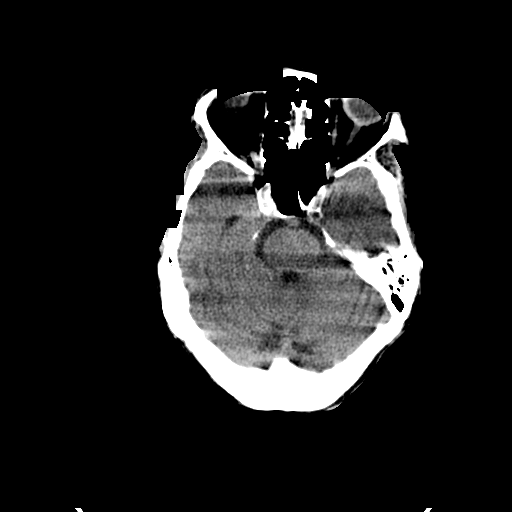
[im 10/36  brain]
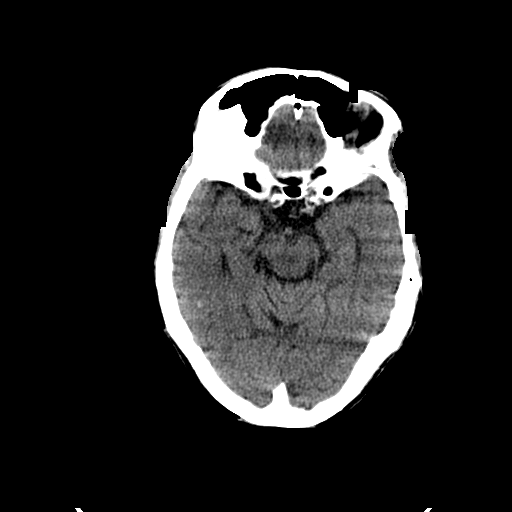
[im 12/36  brain]
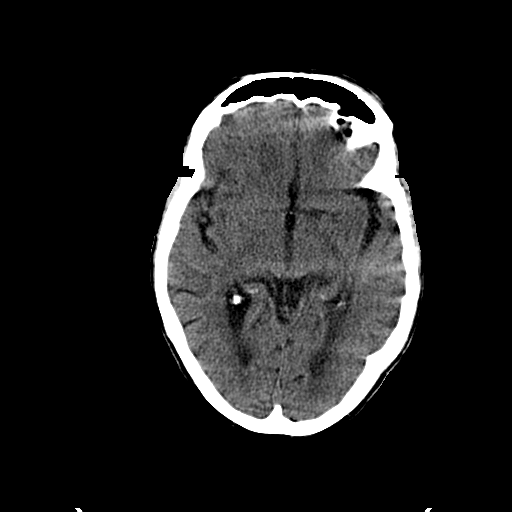
[im 12/36  bone]
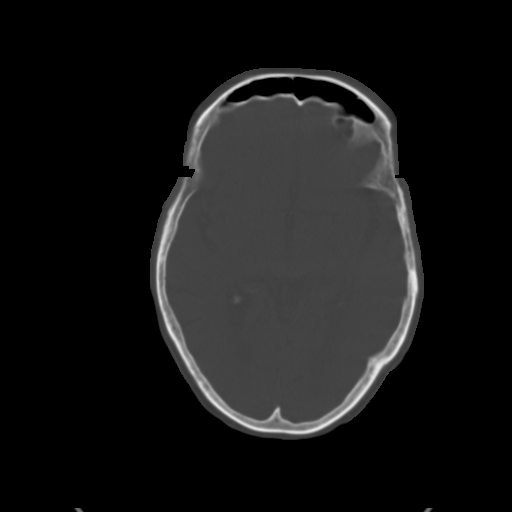
[im 13/36  brain]
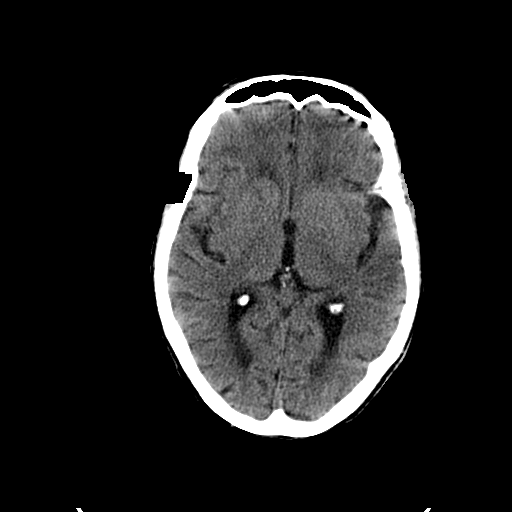
[im 15/36  brain]
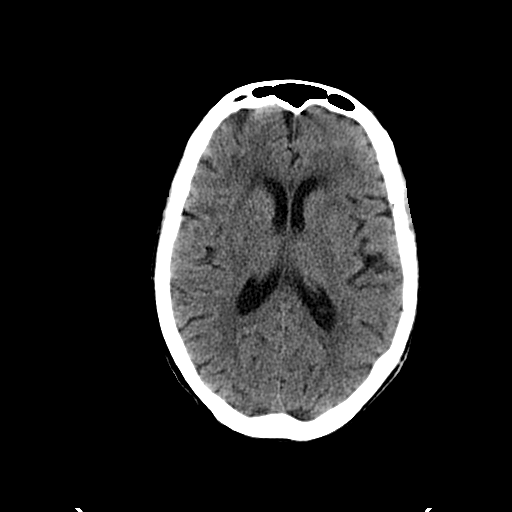
[im 19/36  brain]
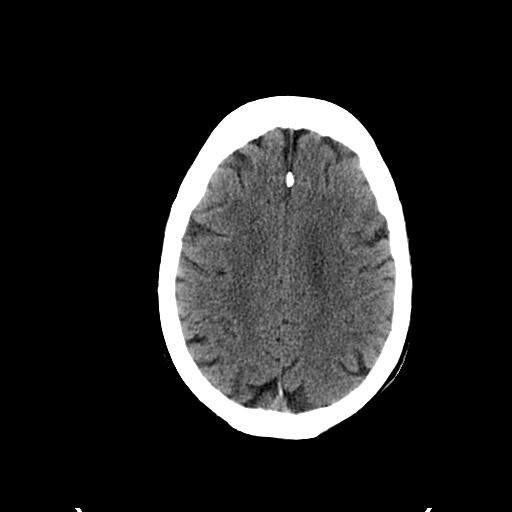
[im 21/36  brain]
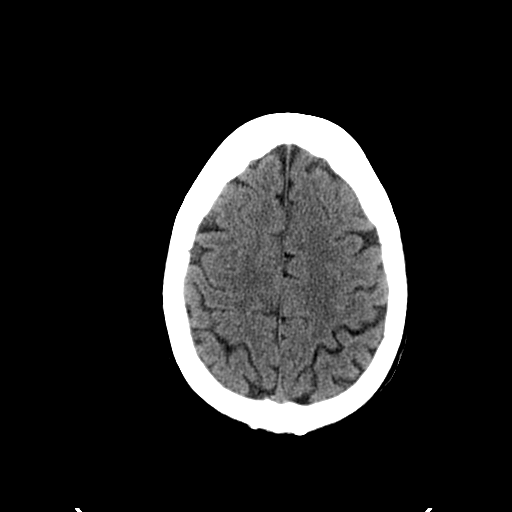
[im 21/36  bone]
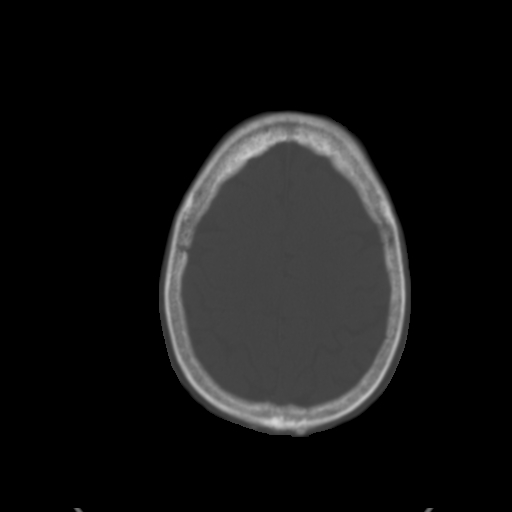
[im 23/36  brain]
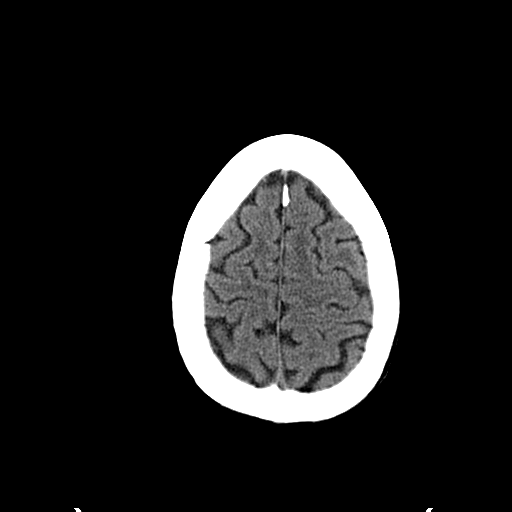
[im 24/36  brain]
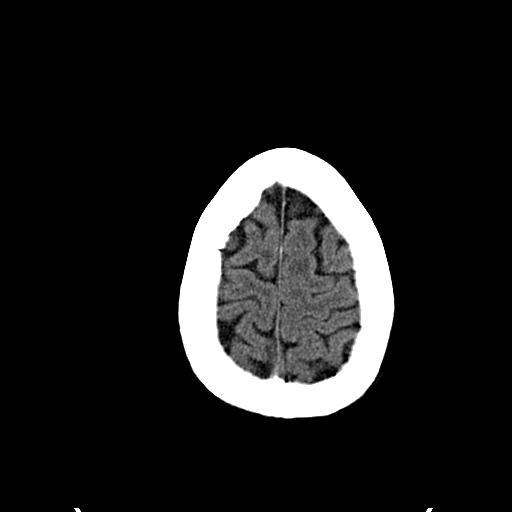
[im 26/36  brain]
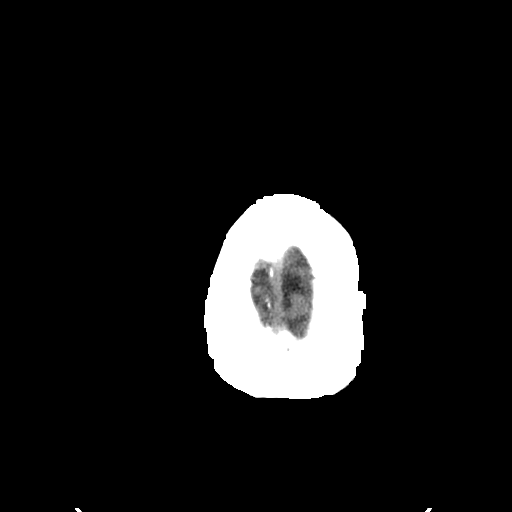
[im 30/36  brain]
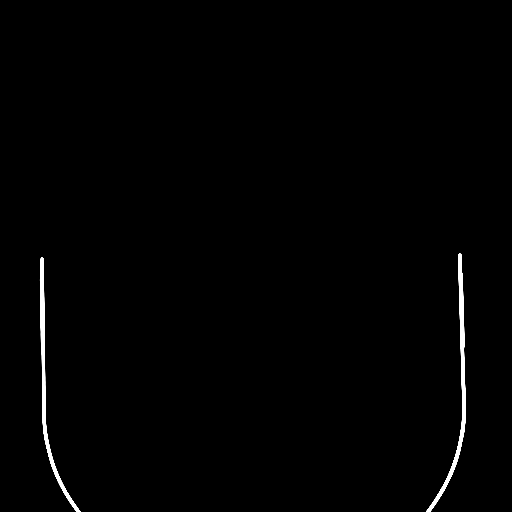
[im 30/36  bone]
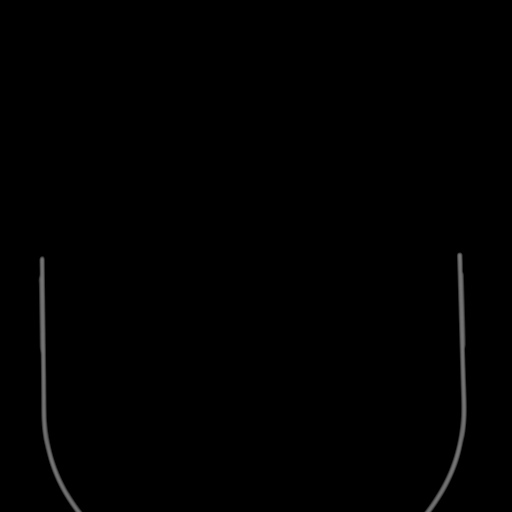
[im 32/36  brain]
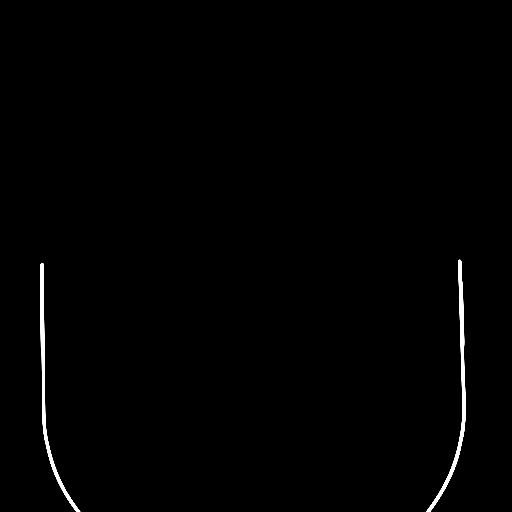
[im 34/36  brain]
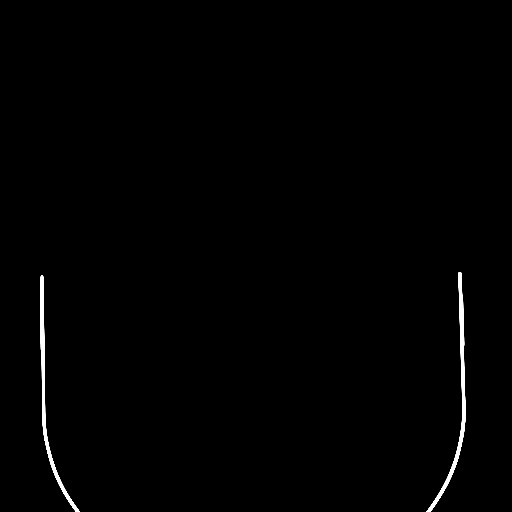

[15 of 30 positions shown; findings below may reference images not displayed]

FINDINGS: Skull and Sinuses:Negative for fracture or destructive process. The
mastoids, middle ears, and imaged paranasal sinuses are clear.

Orbits: Bilateral cataract resection.  No acute findings.

Brain: No evidence of acute abnormality, such as acute infarction,
hemorrhage, hydrocephalus, or mass lesion/mass effect. There is
chronic small vessel disease with patchy ischemic gliosis in the
deep cerebral white matter. There is confluent periventricular white
matter low-attenuation, which in combination of volume-averaging
accounts for the mildly hazy appearance of the occipital horns
(which is also stable).
IMPRESSION: 1. No acute intracranial findings.
2. Moderate chronic small vessel disease.
